# Patient Record
Sex: Female | Born: 1965
Health system: Southern US, Community
[De-identification: ages and names within clinical notes are randomized; demographics above are authoritative.]

## PROBLEM LIST (undated history)

## (undated) DIAGNOSIS — R519 Headache, unspecified: Secondary | ICD-10-CM

## (undated) DIAGNOSIS — Z720 Tobacco use: Secondary | ICD-10-CM

## (undated) DIAGNOSIS — IMO0001 Reserved for inherently not codable concepts without codable children: Secondary | ICD-10-CM

## (undated) DIAGNOSIS — E669 Obesity, unspecified: Secondary | ICD-10-CM

## (undated) DIAGNOSIS — M549 Dorsalgia, unspecified: Secondary | ICD-10-CM

## (undated) DIAGNOSIS — E119 Type 2 diabetes mellitus without complications: Secondary | ICD-10-CM

## (undated) DIAGNOSIS — M25539 Pain in unspecified wrist: Secondary | ICD-10-CM

## (undated) DIAGNOSIS — N390 Urinary tract infection, site not specified: Secondary | ICD-10-CM

## (undated) DIAGNOSIS — I10 Essential (primary) hypertension: Secondary | ICD-10-CM

## (undated) DIAGNOSIS — R51 Headache: Secondary | ICD-10-CM

## (undated) DIAGNOSIS — N200 Calculus of kidney: Secondary | ICD-10-CM

## (undated) DIAGNOSIS — E7800 Pure hypercholesterolemia, unspecified: Secondary | ICD-10-CM

## (undated) DIAGNOSIS — T7840XA Allergy, unspecified, initial encounter: Secondary | ICD-10-CM

## (undated) HISTORY — DX: Headache, unspecified: R51.9

## (undated) HISTORY — DX: Pain in unspecified wrist: M25.539

## (undated) HISTORY — DX: Allergy, unspecified, initial encounter: T78.40XA

## (undated) HISTORY — DX: Headache: R51

## (undated) HISTORY — DX: Essential (primary) hypertension: I10

## (undated) HISTORY — PX: DENTAL SURGERY: SHX609

---

## 1999-06-28 ENCOUNTER — Inpatient Hospital Stay: Admission: AD | Admit: 1999-06-28 | Discharge: 1999-06-28 | Payer: Self-pay | Admitting: Obstetrics

## 1999-07-01 ENCOUNTER — Emergency Department (HOSPITAL_COMMUNITY): Admission: EM | Admit: 1999-07-01 | Discharge: 1999-07-01 | Payer: Self-pay | Admitting: Emergency Medicine

## 1999-07-13 ENCOUNTER — Inpatient Hospital Stay (HOSPITAL_COMMUNITY): Admission: AD | Admit: 1999-07-13 | Discharge: 1999-07-13 | Payer: Self-pay | Admitting: *Deleted

## 1999-10-29 ENCOUNTER — Emergency Department (HOSPITAL_COMMUNITY): Admission: EM | Admit: 1999-10-29 | Discharge: 1999-10-29 | Payer: Self-pay | Admitting: Emergency Medicine

## 2000-02-12 ENCOUNTER — Inpatient Hospital Stay (HOSPITAL_COMMUNITY): Admission: AD | Admit: 2000-02-12 | Discharge: 2000-02-12 | Payer: Self-pay | Admitting: Obstetrics

## 2000-08-20 ENCOUNTER — Emergency Department (HOSPITAL_COMMUNITY): Admission: EM | Admit: 2000-08-20 | Discharge: 2000-08-20 | Payer: Self-pay | Admitting: *Deleted

## 2000-08-31 ENCOUNTER — Emergency Department (HOSPITAL_COMMUNITY): Admission: EM | Admit: 2000-08-31 | Discharge: 2000-08-31 | Payer: Self-pay | Admitting: Emergency Medicine

## 2000-09-16 ENCOUNTER — Emergency Department (HOSPITAL_COMMUNITY): Admission: EM | Admit: 2000-09-16 | Discharge: 2000-09-17 | Payer: Self-pay | Admitting: Emergency Medicine

## 2000-09-16 ENCOUNTER — Encounter: Payer: Self-pay | Admitting: Emergency Medicine

## 2000-10-05 ENCOUNTER — Encounter: Payer: Self-pay | Admitting: Emergency Medicine

## 2000-10-05 ENCOUNTER — Emergency Department (HOSPITAL_COMMUNITY): Admission: EM | Admit: 2000-10-05 | Discharge: 2000-10-05 | Payer: Self-pay | Admitting: Emergency Medicine

## 2000-11-09 ENCOUNTER — Emergency Department (HOSPITAL_COMMUNITY): Admission: EM | Admit: 2000-11-09 | Discharge: 2000-11-09 | Payer: Self-pay | Admitting: Emergency Medicine

## 2001-07-30 ENCOUNTER — Emergency Department (HOSPITAL_COMMUNITY): Admission: EM | Admit: 2001-07-30 | Discharge: 2001-07-31 | Payer: Self-pay | Admitting: Emergency Medicine

## 2001-07-31 ENCOUNTER — Encounter: Payer: Self-pay | Admitting: Emergency Medicine

## 2002-03-13 ENCOUNTER — Emergency Department (HOSPITAL_COMMUNITY): Admission: EM | Admit: 2002-03-13 | Discharge: 2002-03-13 | Payer: Self-pay | Admitting: Emergency Medicine

## 2002-05-19 ENCOUNTER — Emergency Department (HOSPITAL_COMMUNITY): Admission: EM | Admit: 2002-05-19 | Discharge: 2002-05-20 | Payer: Self-pay | Admitting: Emergency Medicine

## 2002-05-19 ENCOUNTER — Encounter: Payer: Self-pay | Admitting: Emergency Medicine

## 2002-08-02 ENCOUNTER — Encounter: Payer: Self-pay | Admitting: Emergency Medicine

## 2002-08-02 ENCOUNTER — Emergency Department (HOSPITAL_COMMUNITY): Admission: EM | Admit: 2002-08-02 | Discharge: 2002-08-02 | Payer: Self-pay | Admitting: Emergency Medicine

## 2003-03-29 ENCOUNTER — Emergency Department (HOSPITAL_COMMUNITY): Admission: EM | Admit: 2003-03-29 | Discharge: 2003-03-29 | Payer: Self-pay | Admitting: Emergency Medicine

## 2003-06-01 ENCOUNTER — Inpatient Hospital Stay (HOSPITAL_COMMUNITY): Admission: AD | Admit: 2003-06-01 | Discharge: 2003-06-01 | Payer: Self-pay | Admitting: Obstetrics & Gynecology

## 2003-12-08 ENCOUNTER — Emergency Department (HOSPITAL_COMMUNITY): Admission: EM | Admit: 2003-12-08 | Discharge: 2003-12-08 | Payer: Self-pay | Admitting: Emergency Medicine

## 2004-04-20 ENCOUNTER — Ambulatory Visit: Payer: Self-pay | Admitting: *Deleted

## 2004-04-20 ENCOUNTER — Ambulatory Visit: Payer: Self-pay | Admitting: Nurse Practitioner

## 2004-04-27 ENCOUNTER — Ambulatory Visit (HOSPITAL_COMMUNITY): Admission: RE | Admit: 2004-04-27 | Discharge: 2004-04-27 | Payer: Self-pay | Admitting: Family Medicine

## 2004-05-27 ENCOUNTER — Ambulatory Visit: Payer: Self-pay | Admitting: Nurse Practitioner

## 2004-07-22 ENCOUNTER — Ambulatory Visit: Payer: Self-pay | Admitting: Nurse Practitioner

## 2004-07-26 ENCOUNTER — Ambulatory Visit: Payer: Self-pay | Admitting: Nurse Practitioner

## 2004-08-02 ENCOUNTER — Ambulatory Visit: Payer: Self-pay | Admitting: Nurse Practitioner

## 2004-08-16 ENCOUNTER — Ambulatory Visit: Payer: Self-pay | Admitting: Nurse Practitioner

## 2004-08-18 ENCOUNTER — Emergency Department (HOSPITAL_COMMUNITY): Admission: EM | Admit: 2004-08-18 | Discharge: 2004-08-18 | Payer: Self-pay | Admitting: Emergency Medicine

## 2004-08-18 ENCOUNTER — Ambulatory Visit (HOSPITAL_COMMUNITY): Admission: RE | Admit: 2004-08-18 | Discharge: 2004-08-18 | Payer: Self-pay | Admitting: Internal Medicine

## 2004-08-31 ENCOUNTER — Ambulatory Visit: Payer: Self-pay | Admitting: Nurse Practitioner

## 2006-04-04 ENCOUNTER — Emergency Department (HOSPITAL_COMMUNITY): Admission: EM | Admit: 2006-04-04 | Discharge: 2006-04-04 | Payer: Self-pay | Admitting: Emergency Medicine

## 2006-04-07 ENCOUNTER — Emergency Department (HOSPITAL_COMMUNITY): Admission: EM | Admit: 2006-04-07 | Discharge: 2006-04-07 | Payer: Self-pay | Admitting: Emergency Medicine

## 2006-05-05 ENCOUNTER — Emergency Department (HOSPITAL_COMMUNITY): Admission: EM | Admit: 2006-05-05 | Discharge: 2006-05-05 | Payer: Self-pay | Admitting: Emergency Medicine

## 2006-05-10 ENCOUNTER — Ambulatory Visit: Payer: Self-pay | Admitting: Nurse Practitioner

## 2006-08-19 ENCOUNTER — Emergency Department (HOSPITAL_COMMUNITY): Admission: EM | Admit: 2006-08-19 | Discharge: 2006-08-19 | Payer: Self-pay | Admitting: Emergency Medicine

## 2008-04-18 ENCOUNTER — Emergency Department (HOSPITAL_COMMUNITY): Admission: EM | Admit: 2008-04-18 | Discharge: 2008-04-18 | Payer: Self-pay | Admitting: Emergency Medicine

## 2009-02-01 ENCOUNTER — Inpatient Hospital Stay (HOSPITAL_COMMUNITY): Admission: AD | Admit: 2009-02-01 | Discharge: 2009-02-02 | Payer: Self-pay | Admitting: Obstetrics & Gynecology

## 2009-09-13 ENCOUNTER — Emergency Department (HOSPITAL_COMMUNITY): Admission: EM | Admit: 2009-09-13 | Discharge: 2009-09-13 | Payer: Self-pay | Admitting: Emergency Medicine

## 2010-02-01 ENCOUNTER — Emergency Department (HOSPITAL_COMMUNITY): Admission: EM | Admit: 2010-02-01 | Discharge: 2010-02-01 | Payer: Self-pay | Admitting: Family Medicine

## 2010-07-13 ENCOUNTER — Emergency Department (HOSPITAL_COMMUNITY): Admission: EM | Admit: 2010-07-13 | Discharge: 2010-07-13 | Payer: Self-pay | Admitting: Family Medicine

## 2010-09-14 ENCOUNTER — Emergency Department (HOSPITAL_COMMUNITY)
Admission: EM | Admit: 2010-09-14 | Discharge: 2010-09-14 | Payer: Self-pay | Source: Home / Self Care | Admitting: Family Medicine

## 2010-09-21 ENCOUNTER — Other Ambulatory Visit: Payer: Self-pay | Admitting: Family Medicine

## 2010-09-21 ENCOUNTER — Other Ambulatory Visit: Payer: Self-pay

## 2010-09-21 DIAGNOSIS — Z1231 Encounter for screening mammogram for malignant neoplasm of breast: Secondary | ICD-10-CM

## 2010-09-21 DIAGNOSIS — Z01419 Encounter for gynecological examination (general) (routine) without abnormal findings: Secondary | ICD-10-CM | POA: Insufficient documentation

## 2010-09-27 ENCOUNTER — Ambulatory Visit
Admission: RE | Admit: 2010-09-27 | Discharge: 2010-09-27 | Disposition: A | Discharge status: Home / Self Care | Payer: 59 | Source: Ambulatory Visit | Attending: Family Medicine | Admitting: Family Medicine

## 2010-09-27 DIAGNOSIS — Z1231 Encounter for screening mammogram for malignant neoplasm of breast: Secondary | ICD-10-CM

## 2010-10-05 ENCOUNTER — Other Ambulatory Visit (HOSPITAL_COMMUNITY)
Admission: RE | Admit: 2010-10-05 | Discharge: 2010-10-05 | Disposition: A | Discharge status: Home / Self Care | Payer: 59 | Source: Ambulatory Visit | Attending: Internal Medicine | Admitting: Internal Medicine

## 2010-11-22 LAB — WET PREP, GENITAL: Yeast Wet Prep HPF POC: NONE SEEN

## 2010-11-22 LAB — URINALYSIS, ROUTINE W REFLEX MICROSCOPIC
Glucose, UA: NEGATIVE mg/dL
Nitrite: NEGATIVE
Protein, ur: NEGATIVE mg/dL
pH: 6 (ref 5.0–8.0)

## 2010-11-22 LAB — URINE CULTURE

## 2010-11-22 LAB — GC/CHLAMYDIA PROBE AMP, GENITAL
Chlamydia, DNA Probe: NEGATIVE
GC Probe Amp, Genital: NEGATIVE

## 2010-11-22 LAB — URINE MICROSCOPIC-ADD ON

## 2010-11-22 LAB — POCT PREGNANCY, URINE: Preg Test, Ur: NEGATIVE

## 2011-05-18 LAB — URINALYSIS, ROUTINE W REFLEX MICROSCOPIC
Bilirubin Urine: NEGATIVE
Glucose, UA: NEGATIVE
Nitrite: NEGATIVE
Protein, ur: NEGATIVE
Specific Gravity, Urine: 1.023

## 2011-05-18 LAB — URINE CULTURE

## 2011-05-18 LAB — URINE MICROSCOPIC-ADD ON

## 2011-09-12 ENCOUNTER — Other Ambulatory Visit: Payer: Self-pay | Admitting: Family Medicine

## 2011-09-12 DIAGNOSIS — Z1231 Encounter for screening mammogram for malignant neoplasm of breast: Secondary | ICD-10-CM

## 2011-10-07 ENCOUNTER — Ambulatory Visit: Payer: 59

## 2011-10-13 ENCOUNTER — Ambulatory Visit
Admission: RE | Admit: 2011-10-13 | Discharge: 2011-10-13 | Disposition: A | Discharge status: Home / Self Care | Payer: BC Managed Care – PPO | Source: Ambulatory Visit | Attending: Family Medicine | Admitting: Family Medicine

## 2011-10-13 DIAGNOSIS — Z1231 Encounter for screening mammogram for malignant neoplasm of breast: Secondary | ICD-10-CM

## 2011-10-17 ENCOUNTER — Other Ambulatory Visit: Payer: Self-pay

## 2011-10-17 ENCOUNTER — Other Ambulatory Visit (HOSPITAL_COMMUNITY)
Admission: RE | Admit: 2011-10-17 | Discharge: 2011-10-17 | Disposition: A | Discharge status: Home / Self Care | Payer: BC Managed Care – PPO | Source: Ambulatory Visit | Attending: Family Medicine | Admitting: Family Medicine

## 2011-10-17 DIAGNOSIS — Z01419 Encounter for gynecological examination (general) (routine) without abnormal findings: Secondary | ICD-10-CM | POA: Insufficient documentation

## 2012-09-21 ENCOUNTER — Other Ambulatory Visit: Payer: Self-pay | Admitting: Family Medicine

## 2012-09-21 DIAGNOSIS — Z1231 Encounter for screening mammogram for malignant neoplasm of breast: Secondary | ICD-10-CM

## 2012-10-22 ENCOUNTER — Ambulatory Visit
Admission: RE | Admit: 2012-10-22 | Discharge: 2012-10-22 | Disposition: A | Discharge status: Home / Self Care | Payer: BC Managed Care – PPO | Source: Ambulatory Visit | Attending: Family Medicine | Admitting: Family Medicine

## 2012-10-22 DIAGNOSIS — Z1231 Encounter for screening mammogram for malignant neoplasm of breast: Secondary | ICD-10-CM

## 2012-10-29 ENCOUNTER — Other Ambulatory Visit (HOSPITAL_COMMUNITY)
Admission: RE | Admit: 2012-10-29 | Discharge: 2012-10-29 | Disposition: A | Discharge status: Home / Self Care | Payer: BC Managed Care – PPO | Source: Ambulatory Visit | Attending: Family Medicine | Admitting: Family Medicine

## 2012-10-29 ENCOUNTER — Other Ambulatory Visit: Payer: Self-pay | Admitting: Family Medicine

## 2012-10-29 DIAGNOSIS — Z0000 Encounter for general adult medical examination without abnormal findings: Secondary | ICD-10-CM | POA: Insufficient documentation

## 2013-10-22 ENCOUNTER — Other Ambulatory Visit: Payer: Self-pay

## 2013-10-22 DIAGNOSIS — Z1231 Encounter for screening mammogram for malignant neoplasm of breast: Secondary | ICD-10-CM

## 2013-10-30 ENCOUNTER — Other Ambulatory Visit: Payer: Self-pay | Admitting: Family Medicine

## 2013-10-30 DIAGNOSIS — E010 Iodine-deficiency related diffuse (endemic) goiter: Secondary | ICD-10-CM

## 2013-10-31 ENCOUNTER — Ambulatory Visit
Admission: RE | Admit: 2013-10-31 | Discharge: 2013-10-31 | Disposition: A | Discharge status: Home / Self Care | Payer: BC Managed Care – PPO | Source: Ambulatory Visit | Attending: Family Medicine | Admitting: Family Medicine

## 2013-10-31 ENCOUNTER — Ambulatory Visit
Admission: RE | Admit: 2013-10-31 | Discharge: 2013-10-31 | Disposition: A | Discharge status: Home / Self Care | Payer: BC Managed Care – PPO | Source: Ambulatory Visit

## 2013-10-31 DIAGNOSIS — E010 Iodine-deficiency related diffuse (endemic) goiter: Secondary | ICD-10-CM

## 2013-10-31 DIAGNOSIS — Z1231 Encounter for screening mammogram for malignant neoplasm of breast: Secondary | ICD-10-CM

## 2013-12-05 ENCOUNTER — Other Ambulatory Visit: Payer: Self-pay | Admitting: Family Medicine

## 2013-12-05 ENCOUNTER — Ambulatory Visit
Admission: RE | Admit: 2013-12-05 | Discharge: 2013-12-05 | Disposition: A | Discharge status: Home / Self Care | Payer: BC Managed Care – PPO | Source: Ambulatory Visit | Attending: Family Medicine | Admitting: Family Medicine

## 2013-12-05 DIAGNOSIS — M549 Dorsalgia, unspecified: Secondary | ICD-10-CM

## 2014-02-22 ENCOUNTER — Emergency Department (HOSPITAL_COMMUNITY)
Admission: EM | Admit: 2014-02-22 | Discharge: 2014-02-22 | Disposition: A | Discharge status: Home / Self Care | Payer: BC Managed Care – PPO | Attending: Emergency Medicine | Admitting: Emergency Medicine

## 2014-02-22 ENCOUNTER — Emergency Department (HOSPITAL_COMMUNITY): Payer: BC Managed Care – PPO

## 2014-02-22 ENCOUNTER — Encounter (HOSPITAL_COMMUNITY): Payer: Self-pay | Admitting: Emergency Medicine

## 2014-02-22 DIAGNOSIS — F17200 Nicotine dependence, unspecified, uncomplicated: Secondary | ICD-10-CM | POA: Insufficient documentation

## 2014-02-22 DIAGNOSIS — N210 Calculus in bladder: Secondary | ICD-10-CM | POA: Insufficient documentation

## 2014-02-22 DIAGNOSIS — Z3202 Encounter for pregnancy test, result negative: Secondary | ICD-10-CM | POA: Insufficient documentation

## 2014-02-22 DIAGNOSIS — Z87440 Personal history of urinary (tract) infections: Secondary | ICD-10-CM | POA: Insufficient documentation

## 2014-02-22 DIAGNOSIS — E119 Type 2 diabetes mellitus without complications: Secondary | ICD-10-CM | POA: Insufficient documentation

## 2014-02-22 DIAGNOSIS — Z880 Allergy status to penicillin: Secondary | ICD-10-CM | POA: Insufficient documentation

## 2014-02-22 HISTORY — DX: Urinary tract infection, site not specified: N39.0

## 2014-02-22 HISTORY — DX: Type 2 diabetes mellitus without complications: E11.9

## 2014-02-22 HISTORY — DX: Pure hypercholesterolemia, unspecified: E78.00

## 2014-02-22 LAB — URINE MICROSCOPIC-ADD ON

## 2014-02-22 LAB — CBC WITH DIFFERENTIAL/PLATELET
BASOS ABS: 0 10*3/uL (ref 0.0–0.1)
Basophils Relative: 0 % (ref 0–1)
Eosinophils Absolute: 0.1 10*3/uL (ref 0.0–0.7)
Eosinophils Relative: 2 % (ref 0–5)
HEMATOCRIT: 39.4 % (ref 36.0–46.0)
HEMOGLOBIN: 13.5 g/dL (ref 12.0–15.0)
LYMPHS ABS: 3.4 10*3/uL (ref 0.7–4.0)
LYMPHS PCT: 47 % — AB (ref 12–46)
MCH: 28.7 pg (ref 26.0–34.0)
MCHC: 34.3 g/dL (ref 30.0–36.0)
MCV: 83.8 fL (ref 78.0–100.0)
MONO ABS: 0.4 10*3/uL (ref 0.1–1.0)
Monocytes Relative: 6 % (ref 3–12)
NEUTROS ABS: 3.2 10*3/uL (ref 1.7–7.7)
Neutrophils Relative %: 45 % (ref 43–77)
Platelets: 290 10*3/uL (ref 150–400)
RBC: 4.7 MIL/uL (ref 3.87–5.11)
RDW: 13.3 % (ref 11.5–15.5)
WBC: 7.2 10*3/uL (ref 4.0–10.5)

## 2014-02-22 LAB — BASIC METABOLIC PANEL
ANION GAP: 13 (ref 5–15)
BUN: 13 mg/dL (ref 6–23)
CHLORIDE: 103 meq/L (ref 96–112)
CO2: 26 meq/L (ref 19–32)
CREATININE: 0.78 mg/dL (ref 0.50–1.10)
Calcium: 9.2 mg/dL (ref 8.4–10.5)
GFR calc Af Amer: >90 mL/min (ref >90)
GFR calc non Af Amer: >90 mL/min (ref >90)
GLUCOSE: 138 mg/dL — AB (ref 70–99)
Potassium: 3.8 mEq/L (ref 3.7–5.3)
Sodium: 142 mEq/L (ref 137–147)

## 2014-02-22 LAB — URINALYSIS, ROUTINE W REFLEX MICROSCOPIC
Glucose, UA: NEGATIVE mg/dL
Ketones, ur: 15 mg/dL — AB
Nitrite: NEGATIVE
PH: 5.5 (ref 5.0–8.0)
Protein, ur: 100 mg/dL — AB
SPECIFIC GRAVITY, URINE: 1.026 (ref 1.005–1.030)
Urobilinogen, UA: 1 mg/dL (ref 0.0–1.0)

## 2014-02-22 LAB — WET PREP, GENITAL
CLUE CELLS WET PREP: NONE SEEN
Trich, Wet Prep: NONE SEEN
Yeast Wet Prep HPF POC: NONE SEEN

## 2014-02-22 LAB — PREGNANCY, URINE: Preg Test, Ur: NEGATIVE

## 2014-02-22 MED ORDER — OXYCODONE-ACETAMINOPHEN 5-325 MG PO TABS: 1.0000 | ORAL_TABLET | ORAL | Status: DC | PRN

## 2014-02-22 NOTE — ED Provider Notes (Signed)
CSN: [id number]     Arrival date & time 02/22/14  0814 History   First MD Initiated Contact with Patient 02/22/14 0754     Chief Complaint  Patient presents with  . Flank Pain  . Hematuria     (Consider location/radiation/quality/duration/timing/severity/associated sxs/prior Treatment) HPI Comments: The patient is a 48 year female past medical history of diabetes, Ovarian cysts, UTI of present emergency room and chief complaint of dysuria ongoing for 2 weeks. The patient reports she was evaluated by a primary care provider and was diagnosed with a urinary tract infection 2 weeks ago, placed on Macrobid reports completing course. She was also diagnosed with a yeast infection and placed on Diflucan. She reports persistent symptoms and was reevaluated by her PCP Dr. Chapman Fitch, and placed on Cipro, 3 days ago. She reports compliance with all antibiotics. The patient states right lower abdominal pain and back pain since this morning grade 3/10. She was up reports hematuria today. No history of renal calculi. No fever, chills, nausea, vomiting, diarrhea, constipation. Patient's last menstrual period was 08/22/2011.   Patient is a 48 y.o. female presenting with flank pain and hematuria. The history is provided by the patient. No language interpreter was used.  Flank Pain Associated symptoms include abdominal pain. Pertinent negatives include no chills, fever, nausea or vomiting.  Hematuria Associated symptoms include abdominal pain. Pertinent negatives include no chills, fever, nausea or vomiting.    Past Medical History  Diagnosis Date  . UTI (lower urinary tract infection)   . Diabetes mellitus without complication    Past Surgical History  Procedure Laterality Date  . Cesarean section     No family history on file. History  Substance Use Topics  . Smoking status: Current Every Day Smoker -- 0.50 packs/day    Types: Cigarettes  . Smokeless tobacco: Not on file  . Alcohol Use: No   OB  History   Grav Para Term Preterm Abortions TAB SAB Ect Mult Living                 Review of Systems  Constitutional: Negative for fever and chills.  Gastrointestinal: Positive for abdominal pain. Negative for nausea, vomiting, diarrhea and constipation.  Genitourinary: Positive for dysuria, urgency, hematuria and flank pain. Negative for vaginal bleeding and vaginal discharge.      Allergies  Asa; Iohexol; Penicillins; Statins; and Sulfur  Home Medications   Prior to Admission medications   Not on File   BP 139/84  Pulse 89  Temp(Src) 98 F (36.7 C) (Oral)  Resp 18  SpO2 99%  LMP 08/22/2011 Physical Exam  Nursing note and vitals reviewed. Constitutional: She is oriented to person, place, and time. She appears well-developed and well-nourished.  Non-toxic appearance. She does not have a sickly appearance. She does not appear ill. No distress.  HENT:  Head: Normocephalic and atraumatic.  Eyes: EOM are normal. Pupils are equal, round, and reactive to light.  Neck: Neck supple.  Cardiovascular: Normal rate and regular rhythm.   Pulmonary/Chest: Effort normal and breath sounds normal. No respiratory distress. She has no wheezes. She has no rales.  Abdominal: Soft. She exhibits no distension. There is no tenderness. There is no rebound, no guarding, no CVA tenderness, no tenderness at McBurney's point and negative Murphy's sign.  Genitourinary: Uterus is not tender. Cervix exhibits no motion tenderness. Right adnexum displays tenderness. Right adnexum displays no mass. Left adnexum displays no mass and no tenderness.  Minimal amount of white discharge in the posterior  vaginal vault. Chaperone present.   Musculoskeletal: Normal range of motion.  Neurological: She is alert and oriented to person, place, and time.  Skin: Skin is warm and dry. She is not diaphoretic.  Psychiatric: She has a normal mood and affect. Her behavior is normal.    ED Course  Procedures (including  critical care time) Labs Review Results for orders placed during the hospital encounter of 02/22/14  WET PREP, GENITAL      Result Value Ref Range   Yeast Wet Prep HPF POC NONE SEEN  NONE SEEN   Trich, Wet Prep NONE SEEN  NONE SEEN   Clue Cells Wet Prep HPF POC NONE SEEN  NONE SEEN   WBC, Wet Prep HPF POC RARE (*) NONE SEEN  PREGNANCY, URINE      Result Value Ref Range   Preg Test, Ur NEGATIVE  NEGATIVE  URINALYSIS, ROUTINE W REFLEX MICROSCOPIC      Result Value Ref Range   Color, Urine RED (*) YELLOW   APPearance TURBID (*) CLEAR   Specific Gravity, Urine 1.026  1.005 - 1.030   pH 5.5  5.0 - 8.0   Glucose, UA NEGATIVE  NEGATIVE mg/dL   Hgb urine dipstick LARGE (*) NEGATIVE   Bilirubin Urine SMALL (*) NEGATIVE   Ketones, ur 15 (*) NEGATIVE mg/dL   Protein, ur 100 (*) NEGATIVE mg/dL   Urobilinogen, UA 1.0  0.0 - 1.0 mg/dL   Nitrite NEGATIVE  NEGATIVE   Leukocytes, UA SMALL (*) NEGATIVE  CBC WITH DIFFERENTIAL      Result Value Ref Range   WBC 7.2  4.0 - 10.5 K/uL   RBC 4.70  3.87 - 5.11 MIL/uL   Hemoglobin 13.5  12.0 - 15.0 g/dL   HCT 39.4  36.0 - 46.0 %   MCV 83.8  78.0 - 100.0 fL   MCH 28.7  26.0 - 34.0 pg   MCHC 34.3  30.0 - 36.0 g/dL   RDW 13.3  11.5 - 15.5 %   Platelets 290  150 - 400 K/uL   Neutrophils Relative % 45  43 - 77 %   Neutro Abs 3.2  1.7 - 7.7 K/uL   Lymphocytes Relative 47 (*) 12 - 46 %   Lymphs Abs 3.4  0.7 - 4.0 K/uL   Monocytes Relative 6  3 - 12 %   Monocytes Absolute 0.4  0.1 - 1.0 K/uL   Eosinophils Relative 2  0 - 5 %   Eosinophils Absolute 0.1  0.0 - 0.7 K/uL   Basophils Relative 0  0 - 1 %   Basophils Absolute 0.0  0.0 - 0.1 K/uL  BASIC METABOLIC PANEL      Result Value Ref Range   Sodium 142  137 - 147 mEq/L   Potassium 3.8  3.7 - 5.3 mEq/L   Chloride 103  96 - 112 mEq/L   CO2 26  19 - 32 mEq/L   Glucose, Bld 138 (*) 70 - 99 mg/dL   BUN 13  6 - 23 mg/dL   Creatinine, Ser 0.78  0.50 - 1.10 mg/dL   Calcium 9.2  8.4 - 10.5 mg/dL   GFR  calc non Af Amer >90  >90 mL/min   GFR calc Af Amer >90  >90 mL/min   Anion gap 13  5 - 15  URINE MICROSCOPIC-ADD ON      Result Value Ref Range   Squamous Epithelial / LPF FEW (*) RARE   WBC, UA 7-10  <3  WBC/hpf   RBC / HPF TOO NUMEROUS TO COUNT  <3 RBC/hpf   Bacteria, UA FEW (*) RARE   Ct Abdomen Pelvis Wo Contrast  02/22/2014   CLINICAL DATA:  Right-sided flank pain  EXAM: CT ABDOMEN AND PELVIS WITHOUT CONTRAST  TECHNIQUE: Multidetector CT imaging of the abdomen and pelvis was performed following the standard protocol without IV contrast.  COMPARISON:  08/18/2004  FINDINGS: Lung bases demonstrate some dependent atelectatic changes.  The liver, gallbladder, spleen, adrenal glands and pancreas are within normal limits. The kidneys are well visualized bilaterally and reveal no renal calculi or urinary tract obstructive changes.  The appendix is within normal limits. Scattered diverticular change is seen. No significant diverticulitis is noted.  The bladder is well distended. A 7 mm calcification is noted in the dependent portion of the bladder inferiorly consistent with a bladder calculus. This may be the etiology of patient's current hematuria. No pelvic sidewall abnormality is noted. No mass lesion is seen. No significant lymphadenopathy is noted. No acute bony abnormality is seen.  IMPRESSION: 7 mm bladder calculus. This may be the etiology of patient's current hematuria.  No other focal acute abnormality is noted.   Electronically Signed   By: Inez Catalina M.D.   On: 02/22/2014 10:30   US Transvaginal Non-ob  02/22/2014   CLINICAL DATA:  Urinary tract infection. Pain. Evaluate for ovarian torsion.  EXAM: TRANSABDOMINAL ULTRASOUND OF PELVIS  DOPPLER ULTRASOUND OF OVARIES  TECHNIQUE: Transabdominal ultrasound examination of the pelvis was performed including evaluation of the uterus, ovaries, adnexal regions, and pelvic cul-de-sac.  Color and duplex Doppler ultrasound was utilized to evaluate blood  flow to the ovaries.  COMPARISON:  02/22/2014 .  FINDINGS: Uterus  Measurements: 6.6 x 3.1 x 4.0 cm No fibroids or other mass visualized.  Endometrium  Thickness: 4 mm  No focal abnormality visualized.  Right ovary  Measurements: 2.3 x 1.5 x 1.3 some Normal appearance/no adnexal mass.  Left ovary  Measurements: 1.5 x 1.2 x 1.0 cm Normal appearance/no adnexal mass.  Pulsed Doppler evaluation demonstrates normal low-resistance arterial and venous waveforms in both ovaries.  8 mm echogenic focus with shadowing noted along the posterior aspect the bladder suggesting bladder stone.  IMPRESSION: 1. 8 mm bladder stone. 2. No evidence of ovarian torsion.   Electronically Signed   By: Marcello Moores  Register   On: 02/22/2014 11:28   US Pelvis Complete  02/22/2014   CLINICAL DATA:  Urinary tract infection. Pain. Evaluate for ovarian torsion.  EXAM: TRANSABDOMINAL ULTRASOUND OF PELVIS  DOPPLER ULTRASOUND OF OVARIES  TECHNIQUE: Transabdominal ultrasound examination of the pelvis was performed including evaluation of the uterus, ovaries, adnexal regions, and pelvic cul-de-sac.  Color and duplex Doppler ultrasound was utilized to evaluate blood flow to the ovaries.  COMPARISON:  02/22/2014 .  FINDINGS: Uterus  Measurements: 6.6 x 3.1 x 4.0 cm No fibroids or other mass visualized.  Endometrium  Thickness: 4 mm  No focal abnormality visualized.  Right ovary  Measurements: 2.3 x 1.5 x 1.3 some Normal appearance/no adnexal mass.  Left ovary  Measurements: 1.5 x 1.2 x 1.0 cm Normal appearance/no adnexal mass.  Pulsed Doppler evaluation demonstrates normal low-resistance arterial and venous waveforms in both ovaries.  8 mm echogenic focus with shadowing noted along the posterior aspect the bladder suggesting bladder stone.  IMPRESSION: 1. 8 mm bladder stone. 2. No evidence of ovarian torsion.   Electronically Signed   By: Marcello Moores  Register   On: 02/22/2014 11:28   [place]  Art/ven Flow Abd Pelv Doppler  02/22/2014   CLINICAL DATA:  Urinary  tract infection. Pain. Evaluate for ovarian torsion.  EXAM: TRANSABDOMINAL ULTRASOUND OF PELVIS  DOPPLER ULTRASOUND OF OVARIES  TECHNIQUE: Transabdominal ultrasound examination of the pelvis was performed including evaluation of the uterus, ovaries, adnexal regions, and pelvic cul-de-sac.  Color and duplex Doppler ultrasound was utilized to evaluate blood flow to the ovaries.  COMPARISON:  02/22/2014 .  FINDINGS: Uterus  Measurements: 6.6 x 3.1 x 4.0 cm No fibroids or other mass visualized.  Endometrium  Thickness: 4 mm  No focal abnormality visualized.  Right ovary  Measurements: 2.3 x 1.5 x 1.3 some Normal appearance/no adnexal mass.  Left ovary  Measurements: 1.5 x 1.2 x 1.0 cm Normal appearance/no adnexal mass.  Pulsed Doppler evaluation demonstrates normal low-resistance arterial and venous waveforms in both ovaries.  8 mm echogenic focus with shadowing noted along the posterior aspect the bladder suggesting bladder stone.  IMPRESSION: 1. 8 mm bladder stone. 2. No evidence of ovarian torsion.   Electronically Signed   By: Marcello Moores  Register   On: 02/22/2014 11:28      EKG Interpretation None      MDM   Final diagnoses:  Bladder calculi   The patient is a 48 year old female presenting with persistent urinary symptoms for 2 weeks. Currently on day 3 of Cipro and full course of Bactrim, denies history of kidney stone but has had right flank pain and hematuria today. Declines pain medication this time. Plan to repeat UA, pelvic exam. Reevaluation patient resting only room declines pain medication at this time. CT shows a 7 mm bladder calculi, negative ultrasound. Discussed lab results, imaging results, and treatment plan with the patient. Return precautions given. Reports understanding and no other concerns at this time.  Patient is stable for discharge at this time.  Meds given in ED:  Medications - No data to display  Discharge Medication List as of 02/22/2014 11:52 AM    START taking these  medications   Details  oxyCODONE-acetaminophen (PERCOCET/ROXICET) 5-325 MG per tablet Take 1 tablet by mouth every 4 (four) hours as needed for severe pain., Starting 02/22/2014, Until Discontinued, Print            Lorrine Kin, PA-C 02/22/14 1523

## 2014-02-22 NOTE — ED Notes (Signed)
Pt currently unable to urinate. Given water to drink at this time.

## 2014-02-22 NOTE — ED Notes (Signed)
Pt stated that 2 weeks ago she was diagnosed with UTI and given and ABX. Pt finished with course of ABX and then stated that the stinging was still there so she went back to PCP and then was told she had a yeast infection and given another prescription of ABX and finished them. This morning pt stated that when she urinated while at work she had blood in her urine and noticed that she has some right sided flank pain. Rating pain 3/10.

## 2014-02-22 NOTE — ED Provider Notes (Signed)
Medical screening examination/treatment/procedure(s) were performed by non-physician practitioner and as supervising physician I was immediately available for consultation/collaboration.   EKG Interpretation None        Osvaldo Shipper, MD 02/22/14 1601

## 2014-02-22 NOTE — Discharge Instructions (Signed)
Call for a follow up appointment with a Family or Primary Care Provider.  Call a Urologist for further evaluation of your bladder stone. Return if Symptoms worsen.   Take medication as prescribed.  Do not operate heavy machinery while taking Percocet.  Drink plenty of fluids.

## 2014-02-24 LAB — GC/CHLAMYDIA PROBE AMP
CT Probe RNA: NEGATIVE
GC Probe RNA: NEGATIVE

## 2014-07-24 ENCOUNTER — Encounter (HOSPITAL_COMMUNITY): Payer: Self-pay | Admitting: Cardiology

## 2014-07-24 ENCOUNTER — Emergency Department (HOSPITAL_COMMUNITY)
Admission: EM | Admit: 2014-07-24 | Discharge: 2014-07-24 | Disposition: A | Discharge status: Home / Self Care | Payer: BC Managed Care – PPO | Attending: Emergency Medicine | Admitting: Emergency Medicine

## 2014-07-24 DIAGNOSIS — E119 Type 2 diabetes mellitus without complications: Secondary | ICD-10-CM | POA: Insufficient documentation

## 2014-07-24 DIAGNOSIS — W57XXXA Bitten or stung by nonvenomous insect and other nonvenomous arthropods, initial encounter: Secondary | ICD-10-CM | POA: Diagnosis not present

## 2014-07-24 DIAGNOSIS — E780 Pure hypercholesterolemia: Secondary | ICD-10-CM | POA: Insufficient documentation

## 2014-07-24 DIAGNOSIS — Y998 Other external cause status: Secondary | ICD-10-CM | POA: Insufficient documentation

## 2014-07-24 DIAGNOSIS — Y9289 Other specified places as the place of occurrence of the external cause: Secondary | ICD-10-CM | POA: Diagnosis not present

## 2014-07-24 DIAGNOSIS — S60561A Insect bite (nonvenomous) of right hand, initial encounter: Secondary | ICD-10-CM | POA: Diagnosis not present

## 2014-07-24 DIAGNOSIS — Z720 Tobacco use: Secondary | ICD-10-CM | POA: Insufficient documentation

## 2014-07-24 DIAGNOSIS — Z79899 Other long term (current) drug therapy: Secondary | ICD-10-CM | POA: Diagnosis not present

## 2014-07-24 DIAGNOSIS — B86 Scabies: Secondary | ICD-10-CM | POA: Insufficient documentation

## 2014-07-24 DIAGNOSIS — Z87440 Personal history of urinary (tract) infections: Secondary | ICD-10-CM | POA: Diagnosis not present

## 2014-07-24 DIAGNOSIS — R21 Rash and other nonspecific skin eruption: Secondary | ICD-10-CM | POA: Diagnosis present

## 2014-07-24 DIAGNOSIS — S60562A Insect bite (nonvenomous) of left hand, initial encounter: Secondary | ICD-10-CM | POA: Diagnosis not present

## 2014-07-24 DIAGNOSIS — Z880 Allergy status to penicillin: Secondary | ICD-10-CM | POA: Diagnosis not present

## 2014-07-24 DIAGNOSIS — Y9389 Activity, other specified: Secondary | ICD-10-CM | POA: Insufficient documentation

## 2014-07-24 MED ORDER — PERMETHRIN 5 % EX CREA: TOPICAL_CREAM | CUTANEOUS | Status: DC

## 2014-07-24 NOTE — Discharge Instructions (Signed)

## 2014-07-24 NOTE — ED Notes (Signed)
Pt states rash and itching started in between fingers and worse at night. Celesta Gentile shares bed and itching in between fingers. Medicines given by previous MD not helping.

## 2014-07-24 NOTE — ED Notes (Signed)
Declined W/C at D/C and was escorted to lobby by RN. 

## 2014-07-24 NOTE — ED Notes (Signed)
Pt reports she noticed a rash to bilateral upper arms and was given a cream that has not been helping.

## 2014-07-24 NOTE — ED Provider Notes (Signed)
CSN: [id number]     Arrival date & time 07/24/14  1321 History  This chart was scribed for non-physician practitioner, Lorre Munroe, PA-C, working with Fredia Sorrow, MD, by Jeanell Sparrow, ED Scribe. This patient was seen in room TR07C/TR07C and the patient's care was started at 3:02 PM.   Chief Complaint  Patient presents with  . Rash   The history is provided by the patient. No language interpreter was used.   HPI Comments: Jessica Lawson is a 48 y.o. female who presents to the Emergency Department complaining of a rash that started 3 days ago. She reports that the rash is on both of her hands and they are itchy. 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 She states that was given medication from another provider without any relief. She denies fevers, chills, n/v/d.  Past Medical History  Diagnosis Date  . UTI (lower urinary tract infection)   . Diabetes mellitus without complication   . High blood cholesterol    Past Surgical History  Procedure Laterality Date  . Cesarean section     History reviewed. No pertinent family history. History  Substance Use Topics  . Smoking status: Current Every Day Smoker -- 0.50 packs/day    Types: Cigarettes  . Smokeless tobacco: Not on file  . Alcohol Use: No   OB History    No data available     Review of Systems  Skin: Positive for rash.    Allergies  Asa; Iohexol; Penicillins; Statins; and Sulfur  Home Medications   Prior to Admission medications   Medication Sig Start Date End Date Taking? Authorizing Provider  atorvastatin (LIPITOR) 40 MG tablet Take 40 mg by mouth daily.    Historical Provider, MD  Multiple Vitamin (MULTIVITAMIN WITH MINERALS) TABS tablet Take 1 tablet by mouth daily.    Historical Provider, MD  oxyCODONE-acetaminophen (PERCOCET/ROXICET) 5-325 MG per tablet Take 1 tablet by mouth every 4 (four) hours as needed for severe pain. 02/22/14   Lauren Parker, PA-C    BP 143/81 mmHg  Pulse 74  Temp(Src) 97.9 F (36.6 C) (Oral)  Resp 18  Ht 4\' 11"  (1.499 m)  Wt 163 lb (73.936 kg)  BMI 32.90 kg/m2  SpO2 99%  LMP 08/22/2011 Physical Exam  Constitutional: She is oriented to person, place, and time. She appears well-developed and well-nourished. No distress.  HENT:  Head: Normocephalic and atraumatic.  Eyes: Conjunctivae and EOM are normal.  Cardiovascular: Normal rate and regular rhythm.   Pulmonary/Chest: Effort normal and breath sounds normal. No stridor. No respiratory distress.  Abdominal: She exhibits no distension.  Musculoskeletal: She exhibits no edema.  Neurological: She is alert and oriented to person, place, and time. No cranial nerve deficit.  Skin: Skin is warm and dry.  Scattered maculopapular bug bites on the upper extremities suspicious for Scabies.   Psychiatric: She has a normal mood and affect.  Nursing note and vitals reviewed.   ED Course  Procedures (including critical care time) DIAGNOSTIC STUDIES: Oxygen Saturation is 99% on RA, normal by my interpretation.    COORDINATION OF CARE: 3:06 PM- Pt advised of plan for treatment which includes medication and pt agrees.  Labs Review Labs Reviewed - No data to display  Imaging Review No results found.   EKG Interpretation None      MDM   Final diagnoses:  Insect bite    Discussed diagnosis & treatment of scabies with patient and/or parent/guardian.  They have been advised to followup with her primary care doctor 2 weeks after treatment.  They have also been advised to clean entire household, including washing sheets in warm water.   The use of permethrin cream was discussed as well, they were told to use cream from the neck down & leave on for 8-12 hours.  They've been advised to repeat treatment in one week if new eruptions occur. Patient/parent/guardian verbalized understanding.    I personally performed the services described in this documentation, which was  scribed in my presence. The recorded information has been reviewed and is accurate.     Montine Circle, PA-C 07/24/14 1541  Fredia Sorrow, MD 07/28/14 [phone]

## 2014-09-26 ENCOUNTER — Other Ambulatory Visit: Payer: Self-pay

## 2014-09-26 DIAGNOSIS — Z1231 Encounter for screening mammogram for malignant neoplasm of breast: Secondary | ICD-10-CM

## 2014-11-03 ENCOUNTER — Ambulatory Visit: Payer: Self-pay

## 2014-11-03 ENCOUNTER — Ambulatory Visit
Admission: RE | Admit: 2014-11-03 | Discharge: 2014-11-03 | Disposition: A | Discharge status: Home / Self Care | Payer: PRIVATE HEALTH INSURANCE | Source: Ambulatory Visit

## 2014-11-03 DIAGNOSIS — Z1231 Encounter for screening mammogram for malignant neoplasm of breast: Secondary | ICD-10-CM

## 2014-11-20 ENCOUNTER — Encounter: Payer: PRIVATE HEALTH INSURANCE | Attending: Family | Admitting: Skilled Nursing Facility1

## 2014-11-20 ENCOUNTER — Encounter: Payer: Self-pay | Admitting: Skilled Nursing Facility1

## 2014-11-20 VITALS — Ht 59.0 in | Wt 174.0 lb

## 2014-11-20 DIAGNOSIS — Z713 Dietary counseling and surveillance: Secondary | ICD-10-CM | POA: Diagnosis not present

## 2014-11-20 DIAGNOSIS — E669 Obesity, unspecified: Secondary | ICD-10-CM | POA: Insufficient documentation

## 2014-11-20 DIAGNOSIS — Z6835 Body mass index (BMI) 35.0-35.9, adult: Secondary | ICD-10-CM | POA: Diagnosis not present

## 2014-11-20 NOTE — Patient Instructions (Addendum)
-  Try to introduce new vegetables to your diet  -Try a new vegetable each week  -Eat three meals a day and 2-3 snacks -

## 2014-11-20 NOTE — Progress Notes (Signed)
  Medical Nutrition Therapy:  Appt start time: 09:15 end time:  10:15.   Assessment:  Primary concerns today: Referred for obesity. Pt states she wants to focus on Trying to get her A1C decreased from 6.5%. 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 [employer] she Does not like salad or any non starchy vegetable for that matter. Pt states her  fiancee cooks dinner and is busy picking up and dropping of her kids (pt states her kids are all grown). Pt states she goes to sleep at 9pm and tosses/turns all night.  Pts last meal of the day is at 10:30am.  Preferred Learning Style:   Auditory  Visual  Learning Readiness:   Not ready  Contemplating  MEDICATIONS: Metformin   DIETARY INTAKE:  Usual eating pattern includes 2 meals and 2 snacks per day.  Everyday foods include none stated.  Avoided foods include non-starchy vegetables.    24-hr recall:  B ( AM): apple or orange Snk ( AM): fruit L ( PM): salad---mashed potatoes with gravy----none Snk ( PM): fruit D ( PM): none------rice and neck bones (country cooking) Snk ( PM): cookies Beverages: coffee with sweet and low and smoke a cigarrete  Usual physical activity: fitness class 30 minutes twice a week  Estimated energy needs: 1600 calories 180 g carbohydrates 120 g protein 44 g fat  Progress Towards Goal(s):  In progress.   Nutritional Diagnosis:  Impaired nutrient utilization related to type 2 diabetes as evidenced by A1C 6.5%.    Intervention:  Nutrition counseling for type 2 diabetes/weight loss. Dietitian educated the pt on: type 2 diabetes physiology, CHO counting, nutrition facts label reading, eating throughout the day (3 meals/2-3 snacks) and physical activity. Dietitian also advised she consume non-starchy vegetables every day.  Goals: -Try to introduce new vegetables to your diet  -Try a new vegetable each week  -Eat three meals a day and 2-3 snacks -Carbohydrate  count for all meals and snacks  Teaching Method Utilized:  Visual Auditory  Handouts given during visit include:  Diabetes book  Diabetes CHO counting card  Low sodium seasoning options  15 gram carbohydrate snack sheet  Barriers to learning/adherence to lifestyle change: hectic work schedule  Demonstrated degree of understanding via:  Teach Back   Monitoring/Evaluation:  Dietary intake, exercise, CHO counting, and body weight prn.

## 2015-02-06 ENCOUNTER — Emergency Department (HOSPITAL_COMMUNITY)
Admission: EM | Admit: 2015-02-06 | Discharge: 2015-02-06 | Disposition: A | Discharge status: Home / Self Care | Payer: PRIVATE HEALTH INSURANCE | Source: Home / Self Care | Attending: Family Medicine | Admitting: Family Medicine

## 2015-02-06 ENCOUNTER — Encounter (HOSPITAL_COMMUNITY): Payer: Self-pay | Admitting: Emergency Medicine

## 2015-02-06 DIAGNOSIS — R0789 Other chest pain: Secondary | ICD-10-CM

## 2015-02-06 MED ORDER — TRAMADOL HCL 50 MG PO TABS: 50.0000 mg | ORAL_TABLET | Freq: Four times a day (QID) | ORAL | Status: DC | PRN

## 2015-02-06 NOTE — ED Provider Notes (Signed)
CSN: [id number]     Arrival date & time 02/06/15  1549 History   First MD Initiated Contact with Patient 02/06/15 1642     Chief Complaint  Patient presents with  . Chest Pain   (Consider location/radiation/quality/duration/timing/severity/associated sxs/prior Treatment) Patient is a 49 y.o. female presenting with chest pain. The history is provided by the patient.  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 UTI (lower urinary tract infection)   . Diabetes mellitus without complication   . High blood cholesterol    Past Surgical History  Procedure Laterality Date  . Cesarean section     Family History  Problem Relation Age of Onset  . Hyperlipidemia Other   . Hypertension Other   . Diabetes Other    History  Substance Use Topics  . Smoking status: Current Every Day Smoker -- 0.50 packs/day    Types: Cigarettes  . Smokeless tobacco: Not on file  . Alcohol Use: No   OB History    No data available     Review of Systems  Constitutional: Negative.  Negative for fever.  Respiratory: Negative for cough, chest tightness, shortness of breath and wheezing.   Cardiovascular: Positive for chest pain. Negative for palpitations and leg swelling.  Gastrointestinal: Negative.  Negative for abdominal pain.  Musculoskeletal: Negative for back pain.    Allergies  Asa; Iohexol; Penicillins;  Statins; and Sulfur  Home Medications   Prior to Admission medications   Medication Sig Start Date End Date Taking? Authorizing Provider  atorvastatin (LIPITOR) 40 MG tablet Take 40 mg by mouth daily.    Historical Provider, MD  metFORMIN (GLUCOPHAGE) 500 MG tablet Take by mouth 2 (two) times daily with a meal.    Historical Provider, MD  Multiple Vitamin (MULTIVITAMIN WITH MINERALS) TABS tablet Take 1 tablet by mouth daily.    Historical Provider, MD  Omega-3 Fatty Acids (FISH OIL) 1000 MG CAPS Take by mouth.    Historical Provider, MD  oxyCODONE-acetaminophen (PERCOCET/ROXICET) 5-325 MG per tablet Take 1 tablet by mouth every 4 (four) hours as needed for severe pain. Patient not taking: Reported on 11/20/2014 02/22/14   Harvie Heck, PA-C  permethrin (ELIMITE) 5 % cream Apply to entire body other than face - let sit for 14 hours then wash off, may repeat in 1 week if still having symptoms Patient not taking: Reported on 11/20/2014 07/24/14   Montine Circle, PA-C  traMADol (ULTRAM) 50 MG tablet Take 1 tablet (50 mg total) by mouth every 6 (six) hours as needed. 02/06/15   Billy Fischer, MD   BP 147/87 mmHg  Pulse 86  Temp(Src) 98.4 F (36.9 C) (Oral)  Resp 12  SpO2 98%  LMP 08/22/2011 Physical Exam  Constitutional: She is oriented to person, place, and time. She appears well-developed and well-nourished.  Neck: Normal range of motion. Neck  supple.  Cardiovascular: Normal rate, normal heart sounds and intact distal pulses.   Pulmonary/Chest: Effort normal and breath sounds normal. She exhibits tenderness.  Lymphadenopathy:    She has no cervical adenopathy.  Neurological: She is alert and oriented to person, place, and time.  Skin: Skin is warm and dry.  Nursing note and vitals reviewed.   ED Course  Procedures (including critical care time) Labs Review Labs Reviewed - No data to display  Imaging Review No results found.   MDM   1. Right-sided chest wall pain         Billy Fischer, MD 02/07/15 2053

## 2015-02-06 NOTE — ED Notes (Signed)
Reports intermittent, right chest pain and right arm pain with movement.  No sob, no nausea, no vomiting, started one week ago

## 2015-06-10 ENCOUNTER — Ambulatory Visit: Payer: PRIVATE HEALTH INSURANCE | Attending: Family Medicine

## 2015-08-13 ENCOUNTER — Emergency Department (INDEPENDENT_AMBULATORY_CARE_PROVIDER_SITE_OTHER)
Admission: EM | Admit: 2015-08-13 | Discharge: 2015-08-13 | Disposition: A | Discharge status: Home / Self Care | Payer: Self-pay | Source: Home / Self Care

## 2015-08-13 ENCOUNTER — Encounter (HOSPITAL_COMMUNITY): Payer: Self-pay | Admitting: Emergency Medicine

## 2015-08-13 DIAGNOSIS — H6690 Otitis media, unspecified, unspecified ear: Secondary | ICD-10-CM

## 2015-08-13 MED ORDER — AZITHROMYCIN 250 MG PO TABS: ORAL_TABLET | ORAL | Status: DC

## 2015-08-13 NOTE — ED Notes (Signed)
The patient presented to the [facility] with a complaint of nasal congestion and bilateral otalgia as well as back pain that all started 4 days ago.

## 2015-08-13 NOTE — Discharge Instructions (Signed)
Otitis Media, Adult °Otitis media is redness, soreness, and puffiness (swelling) in the space just behind your eardrum (middle ear). It may be caused by allergies or infection. It often happens along with a cold. °HOME CARE °· Take your medicine as told. Finish it even if you start to feel better. °· Only take over-the-counter or prescription medicines for pain, discomfort, or fever as told by your doctor. °· Follow up with your doctor as told. °GET HELP IF: °· You have otitis media only in one ear, or bleeding from your nose, or both. °· You notice a lump on your neck. °· You are not getting better in 3-5 days. °· You feel worse instead of better. °GET HELP RIGHT AWAY IF:  °· You have pain that is not helped with medicine. °· You have puffiness, redness, or pain around your ear. °· You get a stiff neck. °· You cannot move part of your face (paralysis). °· You notice that the bone behind your ear hurts when you touch it. °MAKE SURE YOU:  °· Understand these instructions. °· Will watch your condition. °· Will get help right away if you are not doing well or get worse. °  °This information is not intended to replace advice given to you by your health care provider. Make sure you discuss any questions you have with your health care provider. °  °Document Released: 01/18/2008 Document Revised: 08/22/2014 Document Reviewed: 02/26/2013 °Elsevier Interactive Patient Education ©2016 [employer] ° ° °

## 2015-08-13 NOTE — ED Provider Notes (Signed)
CSN: [id number]     Arrival date & time 08/13/15  1513 History   None    Chief Complaint  Patient presents with  . Nasal Congestion  . Back Pain   (Consider location/radiation/quality/duration/timing/severity/associated sxs/prior Treatment) HPI History obtained from patient:   LOCATION:upper resp/left ear pain SEVERITY: DURATION:several days CONTEXT:sudden onset QUALITY: MODIFYING FACTORS:Otc meds with some relief ASSOCIATED SYMPTOMS:ear, back pain TIMING:constant OCCUPATION:in house service  Past Medical History  Diagnosis Date  . UTI (lower urinary tract infection)   . Diabetes mellitus without complication ([place])   . High blood cholesterol    Past Surgical History  Procedure Laterality Date  . Cesarean section     Family History  Problem Relation Age of Onset  . Hyperlipidemia Other   . Hypertension Other   . Diabetes Other    Social History  Substance Use Topics  . Smoking status: Current Every Day Smoker -- 0.50 packs/day    Types: Cigarettes  . Smokeless tobacco: None  . Alcohol Use: No   OB History    No data available     Review of Systems ROS +'ve URI symptoms  Denies: HEADACHE, NAUSEA, ABDOMINAL PAIN, CHEST PAIN, CONGESTION, DYSURIA, SHORTNESS OF BREATH  Allergies  Asa; Iohexol; Penicillins; Statins; and Sulfur  Home Medications   Prior to Admission medications   Medication Sig Start Date End Date Taking? Authorizing Provider  atorvastatin (LIPITOR) 40 MG tablet Take 40 mg by mouth daily.    Historical Provider, MD  metFORMIN (GLUCOPHAGE) 500 MG tablet Take by mouth 2 (two) times daily with a meal.    Historical Provider, MD  Multiple Vitamin (MULTIVITAMIN WITH MINERALS) TABS tablet Take 1 tablet by mouth daily.    Historical Provider, MD  Omega-3 Fatty Acids (FISH OIL) 1000 MG CAPS Take by mouth.    Historical Provider, MD  oxyCODONE-acetaminophen (PERCOCET/ROXICET) 5-325 MG per tablet Take 1 tablet by mouth every 4 (four) hours as needed  for severe pain. Patient not taking: Reported on 11/20/2014 02/22/14   Harvie Heck, PA-C  permethrin (ELIMITE) 5 % cream Apply to entire body other than face - let sit for 14 hours then wash off, may repeat in 1 week if still having symptoms Patient not taking: Reported on 11/20/2014 07/24/14   Montine Circle, PA-C  traMADol (ULTRAM) 50 MG tablet Take 1 tablet (50 mg total) by mouth every 6 (six) hours as needed. 02/06/15   Billy Fischer, MD   Meds Ordered and Administered this Visit  Medications - No data to display  BP 159/99 mmHg  Pulse 85  Temp(Src) 98.2 F (36.8 C) (Oral)  Resp 20  SpO2 100%  LMP 08/22/2011 No data found.   Physical Exam NURSES NOTES AND VITAL SIGNS REVIEWED. CONSTITUTIONAL: Well developed, well nourished, no acute distress HEENT: normocephalic, atraumatic Left TM red , bulging, with poor light reflex, Right TM normal EYES: Conjunctiva normal NECK:normal ROM, supple PULMONARY:No respiratory distress, normal effort, Lungs: CTAb/l CARDIOVASCULAR: RRR, no murmur ABDOMEN: soft, ND, NT, +'ve BS MUSCULOSKELETAL: Normal ROM of all extremities SKIN: warm and dry without rash PSYCHIATRIC: Mood and affect normal  ED Course  Procedures (including critical care time)  Labs Review Labs Reviewed - No data to display  Imaging Review No results found.   Visual Acuity Review  Right Eye Distance:   Left Eye Distance:   Bilateral Distance:    Right Eye Near:   Left Eye Near:    Bilateral Near:  MDM   1. Otitis media, recurrence not specified, unspecified chronicity, unspecified laterality, unspecified otitis media type    Patient is advised to continue home symptomatic treatment. Prescription for zpak  sent pharmacy patient has indicated. Patient is advised that if there are new or worsening symptoms or attend the emergency department, or contact primary care provider. Instructions of care provided discharged home in stable condition.  THIS NOTE  WAS GENERATED USING A VOICE RECOGNITION SOFTWARE PROGRAM. ALL REASONABLE EFFORTS  WERE MADE TO PROOFREAD THIS DOCUMENT FOR ACCURACY.     Konrad Felix, [place] 08/13/15 1815

## 2015-09-03 ENCOUNTER — Encounter: Payer: Self-pay | Admitting: Family Medicine

## 2015-09-03 ENCOUNTER — Ambulatory Visit (INDEPENDENT_AMBULATORY_CARE_PROVIDER_SITE_OTHER): Payer: Self-pay | Admitting: Family Medicine

## 2015-09-03 VITALS — BP 133/76 | HR 67 | Temp 97.7°F | Resp 14 | Ht 59.0 in | Wt 175.0 lb

## 2015-09-03 DIAGNOSIS — R7303 Prediabetes: Secondary | ICD-10-CM

## 2015-09-03 DIAGNOSIS — E785 Hyperlipidemia, unspecified: Secondary | ICD-10-CM

## 2015-09-03 DIAGNOSIS — M25511 Pain in right shoulder: Secondary | ICD-10-CM

## 2015-09-03 DIAGNOSIS — Z23 Encounter for immunization: Secondary | ICD-10-CM

## 2015-09-03 DIAGNOSIS — E669 Obesity, unspecified: Secondary | ICD-10-CM

## 2015-09-03 DIAGNOSIS — K121 Other forms of stomatitis: Secondary | ICD-10-CM

## 2015-09-03 LAB — COMPLETE METABOLIC PANEL WITH GFR
ALT: 24 U/L (ref 6–29)
AST: 20 U/L (ref 10–35)
Albumin: 4.3 g/dL (ref 3.6–5.1)
Alkaline Phosphatase: 85 U/L (ref 33–115)
BUN: 10 mg/dL (ref 7–25)
CALCIUM: 9.6 mg/dL (ref 8.6–10.2)
CO2: 27 mmol/L (ref 20–31)
CREATININE: 0.74 mg/dL (ref 0.50–1.10)
Chloride: 104 mmol/L (ref 98–110)
GFR, Est African American: >89 mL/min (ref >=60)
GFR, Est Non African American: >89 mL/min (ref >=60)
GLUCOSE: 114 mg/dL — AB (ref 65–99)
POTASSIUM: 3.9 mmol/L (ref 3.5–5.3)
SODIUM: 141 mmol/L (ref 135–146)
Total Bilirubin: 0.4 mg/dL (ref 0.2–1.2)
Total Protein: 7.5 g/dL (ref 6.1–8.1)

## 2015-09-03 LAB — CBC WITH DIFFERENTIAL/PLATELET
Basophils Absolute: 0 10*3/uL (ref 0.0–0.1)
Basophils Relative: 0 % (ref 0–1)
EOS PCT: 1 % (ref 0–5)
Eosinophils Absolute: 0.1 10*3/uL (ref 0.0–0.7)
HEMATOCRIT: 45 % (ref 36.0–46.0)
HEMOGLOBIN: 15.3 g/dL — AB (ref 12.0–15.0)
Lymphocytes Relative: 50 % — ABNORMAL HIGH (ref 12–46)
Lymphs Abs: 4.2 10*3/uL — ABNORMAL HIGH (ref 0.7–4.0)
MCH: 28.4 pg (ref 26.0–34.0)
MCHC: 34 g/dL (ref 30.0–36.0)
MCV: 83.6 fL (ref 78.0–100.0)
MONO ABS: 0.5 10*3/uL (ref 0.1–1.0)
MONOS PCT: 6 % (ref 3–12)
MPV: 9.4 fL (ref 8.6–12.4)
NEUTROS ABS: 3.6 10*3/uL (ref 1.7–7.7)
Neutrophils Relative %: 43 % (ref 43–77)
Platelets: 362 10*3/uL (ref 150–400)
RBC: 5.38 MIL/uL — ABNORMAL HIGH (ref 3.87–5.11)
RDW: 14.4 % (ref 11.5–15.5)
WBC: 8.4 10*3/uL (ref 4.0–10.5)

## 2015-09-03 LAB — POCT URINALYSIS DIP (DEVICE)
Bilirubin Urine: NEGATIVE
Glucose, UA: NEGATIVE mg/dL
Ketones, ur: NEGATIVE mg/dL
LEUKOCYTES UA: NEGATIVE
NITRITE: NEGATIVE
Protein, ur: NEGATIVE mg/dL
Specific Gravity, Urine: >=1.03 (ref 1.005–1.030)
UROBILINOGEN UA: 0.2 mg/dL (ref 0.0–1.0)
pH: 5.5 (ref 5.0–8.0)

## 2015-09-03 MED ORDER — ACETAMINOPHEN-CODEINE #3 300-30 MG PO TABS: 1.0000 | ORAL_TABLET | Freq: Three times a day (TID) | ORAL | Status: DC | PRN

## 2015-09-03 MED FILL — ACETAMINOPHEN/COD #3 TABLET: 300-30 | 10 days supply | Qty: 30 | Fill #0

## 2015-09-03 NOTE — Progress Notes (Signed)
Subjective:    Patient ID: Jessica Lawson, female    DOB: 09-18-1965, 50 y.o.   MRN: [id number]  HPI [name], a 50 year old female that presents to establish care. [name] states that she was a patient Dr. Antony Blackbird, she has not been followed greater than 1 year due to insurance constraints. She states that she has a history of prediabetes and hyperlipidemia and was taking Metformin 500 mg daily prior to loosing her job.  Patient denies foot ulcerations, increase appetite, nausea, paresthesia of the feet, polydipsia, polyuria, visual disturbances and weight loss.  Evaluation to date has been included: hemoglobin A1C.  Patient also complains of periodic mouth sores. She states that she typically gets mouth sores every few weeks. She associates mouth sores with eating citrus fruits. She denies fever, fatigue, abdominal pain, dysuria, nausea, vomiting, or diarrhea. She is currently not sexually active.   Patient is also complaining of right shoulder pain for greater than 1 month. She states that she has not injured right arm. Pain intensity is 6/10 described as intermittent and throbbing. Pain primarily occurs in the am. She maintains that pain is worsened by overhead lifting and lifting heavy objects. 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 She states that she has taken Tylenol with minimal relief.   Past Medical History  Diagnosis Date  . UTI (lower urinary tract infection)   . Diabetes mellitus without complication ([place])   . High blood cholesterol    Allergies  Allergen Reactions  . Asa [Aspirin]   . Iohexol      Desc: pt complains of difficulty swallowing and thickened tongue   . Penicillins   . Statins   . Sulfur    Immunization History  Administered Date(s) Administered  . Pneumococcal Polysaccharide-23 09/03/2015   Past Surgical History  Procedure Laterality Date  . Cesarean section     Review of  Systems  Constitutional: Negative.  Negative for fever and unexpected weight change.  HENT: Positive for mouth sores.   Eyes: Negative.   Respiratory: Negative.   Cardiovascular: Negative.  Negative for palpitations and leg swelling.  Gastrointestinal: Negative.   Endocrine: Negative.  Negative for polydipsia, polyphagia and polyuria.  Genitourinary: Negative.   Musculoskeletal: Negative.   Skin: Negative.   Allergic/Immunologic: Negative.   Neurological: Negative.   Hematological: Negative.   Psychiatric/Behavioral: Negative.       Objective:   Physical Exam  Constitutional: She is oriented to person, place, and time. She appears well-developed and well-nourished.  HENT:  Head: Normocephalic and atraumatic.  Right Ear: External ear normal.  Left Ear: External ear normal.  Mouth/Throat: Oropharynx is clear and moist.  Eyes: Conjunctivae and EOM are normal. Pupils are equal, round, and reactive to light.  Neck: Normal range of motion. Neck supple.  Cardiovascular: Normal rate, normal heart sounds and intact distal pulses.   Pulmonary/Chest: Effort normal and breath sounds normal.  Abdominal: Soft. Bowel sounds are normal.  Musculoskeletal: Normal range of motion.  Neurological: She is alert and oriented to person, place, and time. She has normal reflexes.  Skin: Skin is warm and dry.  Psychiatric: She has a normal mood and affect. Her behavior is normal. Judgment and thought content normal.       BP 133/76 mmHg  Pulse 67  Temp(Src) 97.7 F (36.5 C) (Oral)  Resp 14  Ht 4\' 11"  (1.499 Lawson)  Wt 175 lb (79.379 kg)  BMI  35.33 kg/m2  LMP 08/22/2011 Assessment & Plan:   1. Prediabetes Recommend a lowfat, low carbohydrate diet divided over 5-6 meals. Discussed diet and exercise regimen at length.  - Hemoglobin A1c  - COMPLETE METABOLIC PANEL WITH GFR - POCT urinalysis dipstick  2. Hyperlipidemia -lipid panel; future  3. Obesity I suspect metabolic syndrome due to BMI of  35, abdominal obesity, prediabetes and hx of hyperlipidemia. Recommend a lowfat, low carbohydrate diet divided over 5-6 small meals, increase water intake to 6-8 glasses, and 150 minutes per week of cardiovascular exercise.   - TSH - COMPLETE METABOLIC PANEL WITH GFR - CBC with Differential  4. Right shoulder pain Patient has an allergy to aspirin. She states that she she takes aspirin, she typically has throat swelling. Will not start NSaids for right shoulder pain. Will start a trial of Tylenol with codeine every 8 hours as needed for moderate to severe pain. Apply warm, moist compresses to right shoulder. Refrain from lifting objects greater than 20 pounds.  - acetaminophen-codeine (TYLENOL #3) 300-30 MG tablet; Take 1 tablet by mouth every 8 (eight) hours as needed for moderate pain.  Dispense: 30 tablet; Refill: 0  5. Mouth ulcers  - HSV(herpes simplex vrs) 1+2 ab-IgG  6. Immunization due  - Pneumococcal polysaccharide vaccine 23-valent greater than or equal to 2yo subcutaneous/IM   Routine Health Maintenance:  Balanced diet Pap smear: 2 years ago Recommend a yearly mammogram   Jessica Mangen M, FNP    The patient was given clear instructions to go to ER or return to medical center if symptoms do not improve, worsen or new problems develop. The patient verbalized understanding. Will notify patient with laboratory results.

## 2015-09-03 NOTE — Patient Instructions (Addendum)
Recommend a lowfat, low carbohydrate diet divided over 5-6 small meals, increase water intake to 6-8 glasses, and 150 minutes per week of cardiovascular exercise.  Fat and Cholesterol Restricted Diet High levels of fat and cholesterol in your blood may lead to various health problems, such as diseases of the heart, blood vessels, gallbladder, liver, and pancreas. Fats are concentrated sources of energy that come in various forms. Certain types of fat, including saturated fat, may be harmful in excess. Cholesterol is a substance needed by your body in small amounts. Your body makes all the cholesterol it needs. Excess cholesterol comes from the food you eat. When you have high levels of cholesterol and saturated fat in your blood, health problems can develop because the excess fat and cholesterol will gather along the walls of your blood vessels, causing them to narrow. Choosing the right foods will help you control your intake of fat and cholesterol. This will help keep the levels of these substances in your blood within normal limits and reduce your risk of disease. WHAT IS MY PLAN? Your health care provider recommends that you:  Get no more than __________ % of the total calories in your daily diet from fat.  Limit your intake of saturated fat to less than ______% of your total calories each day.  Limit the amount of cholesterol in your diet to less than _________mg per day. WHAT TYPES OF FAT SHOULD I CHOOSE?  Choose healthy fats more often. Choose monounsaturated and polyunsaturated fats, such as olive and canola oil, flaxseeds, walnuts, almonds, and seeds.  Eat more omega-3 fats. Good choices include salmon, mackerel, sardines, tuna, flaxseed oil, and ground flaxseeds. Aim to eat fish at least two times a week.  Limit saturated fats. Saturated fats are primarily found in animal products, such as meats, butter, and cream. Plant sources of saturated fats include palm oil, palm kernel oil, and  coconut oil.  Avoid foods with partially hydrogenated oils in them. These contain trans fats. Examples of foods that contain trans fats are stick margarine, some tub margarines, cookies, crackers, and other baked goods. WHAT GENERAL GUIDELINES DO I NEED TO FOLLOW? These guidelines for healthy eating will help you control your intake of fat and cholesterol:  Check food labels carefully to identify foods with trans fats or high amounts of saturated fat.  Fill one half of your plate with vegetables and green salads.  Fill one fourth of your plate with whole grains. Look for the word "whole" as the first word in the ingredient list.  Fill one fourth of your plate with lean protein foods.  Limit fruit to two servings a day. Choose fruit instead of juice.  Eat more foods that contain soluble fiber. Examples of foods that contain this type of fiber are apples, broccoli, carrots, beans, peas, and barley. Aim to get 20-30 g of fiber per day.  Eat more home-cooked food and less restaurant, buffet, and fast food.  Limit or avoid alcohol.  Limit foods high in starch and sugar.  Limit fried foods.  Cook foods using methods other than frying. Baking, boiling, grilling, and broiling are all great options.  Lose weight if you are overweight. Losing just 5-10% of your initial body weight can help your overall health and prevent diseases such as diabetes and heart disease. WHAT FOODS CAN I EAT? Grains Whole grains, such as whole wheat or whole grain breads, crackers, cereals, and pasta. Unsweetened oatmeal, bulgur, barley, quinoa, or brown rice. Corn or whole wheat  flour tortillas. Vegetables Fresh or frozen vegetables (raw, steamed, roasted, or grilled). Green salads. Fruits All fresh, canned (in natural juice), or frozen fruits. Meat and Other Protein Products Ground beef (85% or leaner), grass-fed beef, or beef trimmed of fat. Skinless chicken or [place]. Ground chicken or [place]. Pork trimmed  of fat. All fish and seafood. Eggs. Dried beans, peas, or lentils. Unsalted nuts or seeds. Unsalted canned or dry beans. Dairy Low-fat dairy products, such as skim or 1% milk, 2% or reduced-fat cheeses, low-fat ricotta or cottage cheese, or plain low-fat yogurt. Fats and Oils Tub margarines without trans fats. Light or reduced-fat mayonnaise and salad dressings. Avocado. Olive, canola, sesame, or safflower oils. Natural peanut or almond butter (choose ones without added sugar and oil). The items listed above may not be a complete list of recommended foods or beverages. Contact your dietitian for more options. WHAT FOODS ARE NOT RECOMMENDED? Grains White bread. White pasta. White rice. Cornbread. Bagels, pastries, and croissants. Crackers that contain trans fat. Vegetables White potatoes. Corn. Creamed or fried vegetables. Vegetables in a cheese sauce. Fruits Dried fruits. Canned fruit in light or heavy syrup. Fruit juice. Meat and Other Protein Products Fatty cuts of meat. Ribs, chicken wings, bacon, sausage, bologna, salami, chitterlings, fatback, hot dogs, bratwurst, and packaged luncheon meats. Liver and organ meats. Dairy Whole or 2% milk, cream, half-and-half, and cream cheese. Whole milk cheeses. Whole-fat or sweetened yogurt. Full-fat cheeses. Nondairy creamers and whipped toppings. Processed cheese, cheese spreads, or cheese curds. Sweets and Desserts Corn syrup, sugars, honey, and molasses. Candy. Jam and jelly. Syrup. Sweetened cereals. Cookies, pies, cakes, donuts, muffins, and ice cream. Fats and Oils Butter, stick margarine, lard, shortening, ghee, or bacon fat. Coconut, palm kernel, or palm oils. Beverages Alcohol. Sweetened drinks (such as sodas, lemonade, and fruit drinks or punches). The items listed above may not be a complete list of foods and beverages to avoid. Contact your dietitian for more information.   This information is not intended to replace advice given to you  by your health care provider. Make sure you discuss any questions you have with your health care provider.   Document Released: 08/01/2005 Document Revised: 08/22/2014 Document Reviewed: 10/30/2013 Elsevier Interactive Patient Education 2016 [employer]. Exercising to [employer] Exercising can help you to lose weight. In order to lose weight through exercise, you need to do vigorous-intensity exercise. You can tell that you are exercising with vigorous intensity if you are breathing very hard and fast and cannot hold a conversation while exercising. Moderate-intensity exercise helps to maintain your current weight. You can tell that you are exercising at a moderate level if you have a higher heart rate and faster breathing, but you are still able to hold a conversation. HOW OFTEN SHOULD I EXERCISE? Choose an activity that you enjoy and set realistic goals. Your health care provider can help you to make an activity plan that works for you. Exercise regularly as directed by your health care provider. This may include:  Doing resistance training twice each week, such as:  Push-ups.  Sit-ups.  Lifting weights.  Using resistance bands.  Doing a given intensity of exercise for a given amount of time. Choose from these options:  150 minutes of moderate-intensity exercise every week.  75 minutes of vigorous-intensity exercise every week.  A mix of moderate-intensity and vigorous-intensity exercise every week. Children, pregnant women, people who are out of shape, people who are overweight, and older adults may need to consult a health  care provider for individual recommendations. If you have any sort of medical condition, be sure to consult your health care provider before starting a new exercise program. WHAT ARE SOME ACTIVITIES THAT CAN HELP ME TO LOSE WEIGHT?   Walking at a rate of at least 4.5 miles an hour.  Jogging or running at a rate of 5 miles per hour.  Biking at a rate of at  least 10 miles per hour.  Lap swimming.  Roller-skating or in-line skating.  Cross-country skiing.  Vigorous competitive sports, such as football, basketball, and soccer.  Jumping rope.  Aerobic dancing. HOW CAN I BE MORE ACTIVE IN MY DAY-TO-DAY ACTIVITIES?  Use the stairs instead of the elevator.  Take a walk during your lunch break.  If you drive, park your car farther away from work or school.  If you take public transportation, get off one stop early and walk the rest of the way.  Make all of your phone calls while standing up and walking around.  Get up, stretch, and walk around every 30 minutes throughout the day. WHAT GUIDELINES SHOULD I FOLLOW WHILE EXERCISING?  Do not exercise so much that you hurt yourself, feel dizzy, or get very short of breath.  Consult your health care provider prior to starting a new exercise program.  Wear comfortable clothes and shoes with good support.  Drink plenty of water while you exercise to prevent dehydration or heat stroke. Body water is lost during exercise and must be replaced.  Work out until you breathe faster and your heart beats faster.   This information is not intended to replace advice given to you by your health care provider. Make sure you discuss any questions you have with your health care provider.   Document Released: 09/03/2010 Document Revised: 08/22/2014 Document Reviewed: 01/02/2014 Elsevier Interactive Patient Education 2016 [place] Choices to Lower Your Triglycerides Triglycerides are a type of fat in your blood. High levels of triglycerides can increase the risk of heart disease and stroke. If your triglyceride levels are high, the foods you eat and your eating habits are very important. Choosing the right foods can help lower your triglycerides.  WHAT GENERAL GUIDELINES DO I NEED TO FOLLOW?  Lose weight if you are overweight.   Limit or avoid alcohol.   Fill one half of your plate with  vegetables and green salads.   Limit fruit to two servings a day. Choose fruit instead of juice.   Make one fourth of your plate whole grains. Look for the word "whole" as the first word in the ingredient list.  Fill one fourth of your plate with lean protein foods.  Enjoy fatty fish (such as salmon, mackerel, sardines, and tuna) three times a week.   Choose healthy fats.   Limit foods high in starch and sugar.  Eat more home-cooked food and less restaurant, buffet, and fast food.  Limit fried foods.  Cook foods using methods other than frying.  Limit saturated fats.  Check ingredient lists to avoid foods with partially hydrogenated oils (trans fats) in them. WHAT FOODS CAN I EAT?  Grains Whole grains, such as whole wheat or whole grain breads, crackers, cereals, and pasta. Unsweetened oatmeal, bulgur, barley, quinoa, or brown rice. Corn or whole wheat flour tortillas.  Vegetables Fresh or frozen vegetables (raw, steamed, roasted, or grilled). Green salads. Fruits All fresh, canned (in natural juice), or frozen fruits. Meat and Other Protein Products Ground beef (85% or leaner), grass-fed beef, or beef  trimmed of fat. Skinless chicken or [place]. Ground chicken or [place]. Pork trimmed of fat. All fish and seafood. Eggs. Dried beans, peas, or lentils. Unsalted nuts or seeds. Unsalted canned or dry beans. Dairy Low-fat dairy products, such as skim or 1% milk, 2% or reduced-fat cheeses, low-fat ricotta or cottage cheese, or plain low-fat yogurt. Fats and Oils Tub margarines without trans fats. Light or reduced-fat mayonnaise and salad dressings. Avocado. Safflower, olive, or canola oils. Natural peanut or almond butter. The items listed above may not be a complete list of recommended foods or beverages. Contact your dietitian for more options. WHAT FOODS ARE NOT RECOMMENDED?  Grains White bread. White pasta. White rice. Cornbread. Bagels, pastries, and croissants. Crackers that  contain trans fat. Vegetables White potatoes. Corn. Creamed or fried vegetables. Vegetables in a cheese sauce. Fruits Dried fruits. Canned fruit in light or heavy syrup. Fruit juice. Meat and Other Protein Products Fatty cuts of meat. Ribs, chicken wings, bacon, sausage, bologna, salami, chitterlings, fatback, hot dogs, bratwurst, and packaged luncheon meats. Dairy Whole or 2% milk, cream, half-and-half, and cream cheese. Whole-fat or sweetened yogurt. Full-fat cheeses. Nondairy creamers and whipped toppings. Processed cheese, cheese spreads, or cheese curds. Sweets and Desserts Corn syrup, sugars, honey, and molasses. Candy. Jam and jelly. Syrup. Sweetened cereals. Cookies, pies, cakes, donuts, muffins, and ice cream. Fats and Oils Butter, stick margarine, lard, shortening, ghee, or bacon fat. Coconut, palm kernel, or palm oils. Beverages Alcohol. Sweetened drinks (such as sodas, lemonade, and fruit drinks or punches). The items listed above may not be a complete list of foods and beverages to avoid. Contact your dietitian for more information.   This information is not intended to replace advice given to you by your health care provider. Make sure you discuss any questions you have with your health care provider.   Document Released: 05/19/2004 Document Revised: 08/22/2014 Document Reviewed: 06/05/2013 Elsevier Interactive Patient Education 2016 Else[employer] Inc[employer] Shoulder Pain The shoulder is the joint that connects your arms to your body. The bones that form the shoulder joint include the upper arm bone (humerus), the shoulder blade (scapula), and the collarbone (clavicle). The top of the humerus is shaped like a ball and fits into a rather flat socket on the scapula (glenoid cavity). A combination of muscles and strong, fibrous tissues that connect muscles to bones (tendons) support your shoulder joint and hold the ball in the socket. Small, fluid-filled sacs (bursae) are located in different  areas of the joint. They act as cushions between the bones and the overlying soft tissues and help reduce friction between the gliding tendons and the bone as you move your arm. Your shoulder joint allows a wide range of motion in your arm. This range of motion allows you to do things like scratch your back or throw a ball. However, this range of motion also makes your shoulder more prone to pain from overuse and injury. Causes of shoulder pain can originate from both injury and overuse and usually can be grouped in the following four categories:  Redness, swelling, and pain (inflammation) of the tendon (tendinitis) or the bursae (bursitis).  Instability, such as a dislocation of the joint.  Inflammation of the joint (arthritis).  Broken bone (fracture). HOME CARE INSTRUCTIONS   Apply ice to the sore area.  Put ice in a plastic bag.  Place a towel between your skin and the bag.  Leave the ice on for 15-20 minutes, 3-4 times per day for the first 2 days,  or as directed by your health care provider.  Stop using cold packs if they do not help with the pain.  If you have a shoulder sling or immobilizer, wear it as long as your caregiver instructs. Only remove it to shower or bathe. Move your arm as little as possible, but keep your hand moving to prevent swelling.  Squeeze a soft ball or foam pad as much as possible to help prevent swelling.  Only take over-the-counter or prescription medicines for pain, discomfort, or fever as directed by your caregiver. SEEK MEDICAL CARE IF:   Your shoulder pain increases, or new pain develops in your arm, hand, or fingers.  Your hand or fingers become cold and numb.  Your pain is not relieved with medicines. SEEK IMMEDIATE MEDICAL CARE IF:   Your arm, hand, or fingers are numb or tingling.  Your arm, hand, or fingers are significantly swollen or turn white or blue. MAKE SURE YOU:   Understand these instructions.  Will watch your  condition.  Will get help right away if you are not doing well or get worse.   This information is not intended to replace advice given to you by your health care provider. Make sure you discuss any questions you have with your health care provider.   Document Released: 05/11/2005 Document Revised: 08/22/2014 Document Reviewed: 11/24/2014 Elsevier Interactive Patient Education [employer].

## 2015-09-04 ENCOUNTER — Other Ambulatory Visit: Payer: Self-pay | Admitting: Family Medicine

## 2015-09-04 DIAGNOSIS — R7303 Prediabetes: Secondary | ICD-10-CM

## 2015-09-04 LAB — HSV(HERPES SIMPLEX VRS) I + II AB-IGG
HSV 1 Glycoprotein G Ab, IgG: 7.93 IV — ABNORMAL HIGH
HSV 2 Glycoprotein G Ab, IgG: 6.5 IV — ABNORMAL HIGH

## 2015-09-04 LAB — HEMOGLOBIN A1C
HEMOGLOBIN A1C: 6.5 % — AB (ref <5.7)
Mean Plasma Glucose: 140 mg/dL — ABNORMAL HIGH (ref <117)

## 2015-09-04 LAB — TSH: TSH: 0.661 u[IU]/mL (ref 0.350–4.500)

## 2015-09-04 MED ORDER — METFORMIN HCL 500 MG PO TABS: 500.0000 mg | ORAL_TABLET | Freq: Two times a day (BID) | ORAL | Status: DC

## 2015-09-04 MED FILL — ?METFORMIN HCL 500MG TABLET: 500 | 30 days supply | Qty: 60 | Fill #0

## 2015-09-05 DIAGNOSIS — E785 Hyperlipidemia, unspecified: Secondary | ICD-10-CM | POA: Insufficient documentation

## 2015-09-05 DIAGNOSIS — K121 Other forms of stomatitis: Secondary | ICD-10-CM | POA: Insufficient documentation

## 2015-09-05 DIAGNOSIS — M25511 Pain in right shoulder: Secondary | ICD-10-CM | POA: Insufficient documentation

## 2015-09-05 DIAGNOSIS — E669 Obesity, unspecified: Secondary | ICD-10-CM | POA: Insufficient documentation

## 2015-09-18 ENCOUNTER — Other Ambulatory Visit (INDEPENDENT_AMBULATORY_CARE_PROVIDER_SITE_OTHER): Payer: Self-pay

## 2015-09-18 DIAGNOSIS — E785 Hyperlipidemia, unspecified: Secondary | ICD-10-CM

## 2015-09-18 LAB — LIPID PANEL
CHOL/HDL RATIO: 12.9 ratio — AB (ref <=5.0)
Cholesterol: 375 mg/dL — ABNORMAL HIGH (ref 125–200)
HDL: 29 mg/dL — ABNORMAL LOW (ref >=46)
Triglycerides: 492 mg/dL — ABNORMAL HIGH (ref <150)

## 2015-09-20 ENCOUNTER — Other Ambulatory Visit: Payer: Self-pay | Admitting: Family Medicine

## 2015-09-20 DIAGNOSIS — E782 Mixed hyperlipidemia: Secondary | ICD-10-CM | POA: Insufficient documentation

## 2015-09-20 MED ORDER — FENOFIBRATE 145 MG PO TABS: 145.0000 mg | ORAL_TABLET | Freq: Every day | ORAL | Status: DC

## 2015-09-21 NOTE — Progress Notes (Signed)
Called and left message for patient to call back regarding labs.Thansk!

## 2015-09-21 NOTE — Progress Notes (Signed)
Patient returned call and I advised of labs and to stop atorvastatin and to start tricor as directed. Advised patient to eat 5 to 6 small meals daily, drink 6 to 8 glasses of water, and to exercise 150 minutes weekly. Appointment was scheduled for patient to have a fasting lipid in 3 months. Thanks!

## 2015-10-12 ENCOUNTER — Ambulatory Visit (INDEPENDENT_AMBULATORY_CARE_PROVIDER_SITE_OTHER): Payer: No Typology Code available for payment source | Admitting: Family Medicine

## 2015-10-12 ENCOUNTER — Encounter: Payer: Self-pay | Admitting: Family Medicine

## 2015-10-12 VITALS — BP 128/82 | HR 68 | Temp 97.8°F | Resp 14 | Ht 59.0 in | Wt 171.0 lb

## 2015-10-12 DIAGNOSIS — R7303 Prediabetes: Secondary | ICD-10-CM

## 2015-10-12 DIAGNOSIS — E669 Obesity, unspecified: Secondary | ICD-10-CM

## 2015-10-12 DIAGNOSIS — E782 Mixed hyperlipidemia: Secondary | ICD-10-CM

## 2015-10-12 MED ORDER — METFORMIN HCL 500 MG PO TABS
500.0000 mg | ORAL_TABLET | Freq: Two times a day (BID) | ORAL | Status: DC
Start: 2015-10-12 — End: 2016-01-13

## 2015-10-12 MED FILL — ?METFORMIN HCL 500MG TABLET: 500 | 30 days supply | Qty: 60 | Fill #0

## 2015-10-12 NOTE — Progress Notes (Signed)
Subjective:    Patient ID: Jessica Lawson, female    DOB: [birth year], 50 y.o.   MRN: [id number]  HPI Jessica Lawson, a 50 year old female that presents for a follow up of hyperlipidemia and pre-diabetes. [name] states that she was unable to get Tricor for cholesterol due to financial constraints. .She also has a history of prediabetes. Previous hemoglobin a1c was 6.5, patient has been unable to take Metformin 500 mg BID. She maintains that she has been primarily taking medication at night. She says that she feels sluggish after taking morning dosage of metformin.   Patient denies foot ulcerations, increase appetite, nausea, paresthesia of the feet, polydipsia, polyuria, visual disturbances and weight loss.  Evaluation to date has been included: hemoglobin A1C.   Past Medical History  Diagnosis Date  . UTI (lower urinary tract infection)   . Diabetes mellitus without complication ([place])   . High blood cholesterol    Allergies  Allergen Reactions  . Asa [Aspirin]   . Iohexol      Desc: pt complains of difficulty swallowing and thickened tongue   . Penicillins   . Statins   . Sulfur    Immunization History  Administered Date(s) Administered  . Pneumococcal Polysaccharide-23 09/03/2015   Past Surgical History  Procedure Laterality Date  . Cesarean section     Review of Systems  Constitutional: Negative.  Negative for fever and unexpected weight change.  Eyes: Negative.   Respiratory: Negative.   Cardiovascular: Negative.  Negative for palpitations and leg swelling.  Gastrointestinal: Negative.   Endocrine: Negative.  Negative for polydipsia, polyphagia and polyuria.  Genitourinary: Negative.   Musculoskeletal: Negative.   Skin: Negative.   Allergic/Immunologic: Negative.   Neurological: Negative.   Hematological: Negative.   Psychiatric/Behavioral: Negative.       Objective:   Physical Exam  Constitutional: She is oriented to person, place, and time. She appears  well-developed and well-nourished.  HENT:  Head: Normocephalic and atraumatic.  Right Ear: External ear normal.  Left Ear: External ear normal.  Mouth/Throat: Oropharynx is clear and moist.  Eyes: Conjunctivae and EOM are normal. Pupils are equal, round, and reactive to light.  Neck: Normal range of motion. Neck supple.  Cardiovascular: Normal rate, normal heart sounds and intact distal pulses.   Pulmonary/Chest: Effort normal and breath sounds normal.  Abdominal: Soft. Bowel sounds are normal.  Musculoskeletal: Normal range of motion.  Neurological: She is alert and oriented to person, place, and time. She has normal reflexes.  Skin: Skin is warm and dry.  Psychiatric: She has a normal mood and affect. Her behavior is normal. Judgment and thought content normal.       BP 128/82 mmHg  Pulse 68  Temp(Src) 97.8 F (36.6 C) (Oral)  Resp 14  Ht 4\' 11"  (1.499 m)  Wt 171 lb (77.565 kg)  BMI 34.52 kg/m2  LMP 08/22/2011 Assessment & Plan:  1. Hypercholesterolemia with hypertriglyceridemia Patient was unable to obtain medication due to financial constraints. Explained the importance of taking medications and following a low fat diet. She expressed understanding.  - Lipid Panel; Future  2. Prediabetes Patient is to continue to take medication in evenings. Will check hemoglobin a1C in 3 months.  - metFORMIN (GLUCOPHAGE) 500 MG tablet; Take 1 tablet (500 mg total) by mouth 2 (two) times daily with a meal. Reported on 09/03/2015  Dispense: 60 tablet; Refill: 3  3. Obesity Patient has decreased weight by 4 pounds since previous visit. Recommend a lowfat,  low carbohydrate diet divided over 5-6 small meals, increase water intake to 6-8 glasses, and 150 minutes per week of cardiovascular exercise.      Routine Health Maintenance:  Discussed previous laboratory results at length Will repeat pap smear Previously sent referral for mammogram Will need an opthalmology evaluation.    RTC: 3  months for follow up of hyperlipidemia and prediabetes  Knute Mazzuca M, FNP    The patient was given clear instructions to go to ER or return to medical center if symptoms do not improve, worsen or new problems develop. The patient verbalized understanding. Will notify patient with laboratory results.

## 2015-10-12 NOTE — Patient Instructions (Signed)
Fat and Cholesterol Restricted Diet High levels of fat and cholesterol in your blood may lead to various health problems, such as diseases of the heart, blood vessels, gallbladder, liver, and pancreas. Fats are concentrated sources of energy that come in various forms. Certain types of fat, including saturated fat, may be harmful in excess. Cholesterol is a substance needed by your body in small amounts. Your body makes all the cholesterol it needs. Excess cholesterol comes from the food you eat. When you have high levels of cholesterol and saturated fat in your blood, health problems can develop because the excess fat and cholesterol will gather along the walls of your blood vessels, causing them to narrow. Choosing the right foods will help you control your intake of fat and cholesterol. This will help keep the levels of these substances in your blood within normal limits and reduce your risk of disease. WHAT IS MY PLAN? Your health care provider recommends that you:  Get no more than __________ % of the total calories in your daily diet from fat.  Limit your intake of saturated fat to less than ______% of your total calories each day.  Limit the amount of cholesterol in your diet to less than _________mg per day. WHAT TYPES OF FAT SHOULD I CHOOSE?  Choose healthy fats more often. Choose monounsaturated and polyunsaturated fats, such as olive and canola oil, flaxseeds, walnuts, almonds, and seeds.  Eat more omega-3 fats. Good choices include salmon, mackerel, sardines, tuna, flaxseed oil, and ground flaxseeds. Aim to eat fish at least two times a week.  Limit saturated fats. Saturated fats are primarily found in animal products, such as meats, butter, and cream. Plant sources of saturated fats include palm oil, palm kernel oil, and coconut oil.  Avoid foods with partially hydrogenated oils in them. These contain trans fats. Examples of foods that contain trans fats are stick margarine, some tub  margarines, cookies, crackers, and other baked goods. WHAT GENERAL GUIDELINES DO I NEED TO FOLLOW? These guidelines for healthy eating will help you control your intake of fat and cholesterol:  Check food labels carefully to identify foods with trans fats or high amounts of saturated fat.  Fill one half of your plate with vegetables and green salads.  Fill one fourth of your plate with whole grains. Look for the word "whole" as the first word in the ingredient list.  Fill one fourth of your plate with lean protein foods.  Limit fruit to two servings a day. Choose fruit instead of juice.  Eat more foods that contain soluble fiber. Examples of foods that contain this type of fiber are apples, broccoli, carrots, beans, peas, and barley. Aim to get 20-30 g of fiber per day.  Eat more home-cooked food and less restaurant, buffet, and fast food.  Limit or avoid alcohol.  Limit foods high in starch and sugar.  Limit fried foods.  Cook foods using methods other than frying. Baking, boiling, grilling, and broiling are all great options.  Lose weight if you are overweight. Losing just 5-10% of your initial body weight can help your overall health and prevent diseases such as diabetes and heart disease. WHAT FOODS CAN I EAT? Grains Whole grains, such as whole wheat or whole grain breads, crackers, cereals, and pasta. Unsweetened oatmeal, bulgur, barley, quinoa, or brown rice. Corn or whole wheat flour tortillas. Vegetables Fresh or frozen vegetables (raw, steamed, roasted, or grilled). Green salads. Fruits All fresh, canned (in natural juice), or frozen fruits. Meat and  Other Protein Products Ground beef (85% or leaner), grass-fed beef, or beef trimmed of fat. Skinless chicken or [place]. Ground chicken or [place]. Pork trimmed of fat. All fish and seafood. Eggs. Dried beans, peas, or lentils. Unsalted nuts or seeds. Unsalted canned or dry beans. Dairy Low-fat dairy products, such as skim or  1% milk, 2% or reduced-fat cheeses, low-fat ricotta or cottage cheese, or plain low-fat yogurt. Fats and Oils Tub margarines without trans fats. Light or reduced-fat mayonnaise and salad dressings. Avocado. Olive, canola, sesame, or safflower oils. Natural peanut or almond butter (choose ones without added sugar and oil). The items listed above may not be a complete list of recommended foods or beverages. Contact your dietitian for more options. WHAT FOODS ARE NOT RECOMMENDED? Grains White bread. White pasta. White rice. Cornbread. Bagels, pastries, and croissants. Crackers that contain trans fat. Vegetables White potatoes. Corn. Creamed or fried vegetables. Vegetables in a cheese sauce. Fruits Dried fruits. Canned fruit in light or heavy syrup. Fruit juice. Meat and Other Protein Products Fatty cuts of meat. Ribs, chicken wings, bacon, sausage, bologna, salami, chitterlings, fatback, hot dogs, bratwurst, and packaged luncheon meats. Liver and organ meats. Dairy Whole or 2% milk, cream, half-and-half, and cream cheese. Whole milk cheeses. Whole-fat or sweetened yogurt. Full-fat cheeses. Nondairy creamers and whipped toppings. Processed cheese, cheese spreads, or cheese curds. Sweets and Desserts Corn syrup, sugars, honey, and molasses. Candy. Jam and jelly. Syrup. Sweetened cereals. Cookies, pies, cakes, donuts, muffins, and ice cream. Fats and Oils Butter, stick margarine, lard, shortening, ghee, or bacon fat. Coconut, palm kernel, or palm oils. Beverages Alcohol. Sweetened drinks (such as sodas, lemonade, and fruit drinks or punches). The items listed above may not be a complete list of foods and beverages to avoid. Contact your dietitian for more information.   This information is not intended to replace advice given to you by your health care provider. Make sure you discuss any questions you have with your health care provider.   Document Released: 08/01/2005 Document Revised: 08/22/2014  Document Reviewed: 10/30/2013 Elsevier Interactive Patient Education 2016 Else[employer] Inc[employer] Dyslipidemia Dyslipidemia is an imbalance of the lipids in your blood. Lipids are waxy, fat-like proteins that your body needs in small amounts. Dyslipidemia often involves the lipids cholesterol or triglycerides. Common forms of dyslipidemia are:  High levels of bad cholesterol (LDL cholesterol). LDL cholesterol is the type of cholesterol that causes heart disease.  Low levels of good cholesterol (HDL cholesterol). HDL cholesterol is the type of cholesterol that helps protect against heart disease.  High levels of triglycerides. Triglycerides are a fatty substance in the blood linked to a buildup of plaque on your arteries. RISK FACTORS  Increased age.  Having a family history of high cholesterol.  Certain medicines, including birth control pills, diuretics, beta-blockers, and some medicines for depression.  Smoking.  Eating a high-fat diet.  Being overweight.  Medical conditions such as diabetes, polycystic ovary syndrome, pregnancy, kidney disease, and hypothyroidism.  Lack of regular exercise. SIGNS AND SYMPTOMS There are no signs or symptoms with dyslipidemia. DIAGNOSIS A simple blood test called a fasting blood test can be done to determine your level of:  Total cholesterol. This is the combined number of LDL cholesterol and HDL cholesterol. A healthy number is lower than 200.  LDL cholesterol. The goal number for LDL cholesterol is different for each person depending on risk factors. Ask your health care provider what your LDL cholesterol number should be.  HDL cholesterol. A healthy level of  HDL cholesterol is 60 or higher. A number lower than 40 for men or 50 for women is a danger sign.  Triglycerides. A healthy triglyceride number is less than 150. TREATMENT Dyslipidemia is a treatable condition. Your health care provider will advise you on what type of treatment is best based on  your age, your test results, and current guidelines. Treatment may include:  Dietary changes. A dietitian may help you create a diet that is based on your risk factors, conditions, and lifestyle.  Regular exercise. This can help lower your LDL cholesterol, raise your HDL cholesterol, and help with weight management. Check with your health care provider before beginning an exercise program. Most people should participate in 30 minutes of brisk exercise 5 days a week.  Quitting smoking.  Medicines to lower LDL cholesterol and triglycerides.  If you have high levels of triglycerides, your health care provider may:  Have you stop drinking alcohol.  Have you restrict your fat intake.  Have you eliminate refined sugars from your diet.  Treat you for other conditions, such as underactive thyroid gland (hypothyroidism) and high blood sugar (hyperglycemia). Your health care provider will monitor your lipid levels with regular blood tests. HOME CARE INSTRUCTIONS  Eat a healthy diet. Follow any diet instructions if they were given to you by your health care provider.  Maintain a healthy weight.  Exercise regularly based on the recommendations of your health care provider.  Do not use any tobacco products, including cigarettes, chewing tobacco, or electronic cigarettes.  Take medicines only as directed by your health care provider.  Keep all follow-up visits as directed by your health care provider. SEEK MEDICAL CARE IF: You are having possible side effects from your medicines.   This information is not intended to replace advice given to you by your health care provider. Make sure you discuss any questions you have with your health care provider.   Document Released: 08/06/2013 Document Revised: 08/22/2014 Document Reviewed: 08/06/2013 Elsevier Interactive Patient Education 2016 Elsevier Inc. Heart Disease Prevention Heart disease is a leading cause of death. There are many things you  can do to help prevent heart disease. BE PHYSICALLY ACTIVE Physical activity is good for your heart. It helps control your blood pressure, cholesterol levels, and weight. Try to be physically active every day. Ask your health care provider what activities are best for you.  BE A HEALTHY WEIGHT Extra weight can strain your heart and affect your blood pressure and cholesterol levels. Lose weight with diet and exercise if recommended by your health care provider. EAT HEART-HEALTHY FOODS Follow a healthy eating plan as recommended by your health care provider or dietitian. Heart-healthy foods include:   High-fiber foods. These include oat bran, oatmeal, and whole-grain breads and cereals.  Fruits and vegetables. Avoid:  Alcohol.  Fried foods.  Foods high in saturated fat. These include meats, butter, whole dairy products, shortening, and coconut or palm oil.  Salty foods. These include canned food, luncheon meat, salty snacks, and fast food. KEEP YOUR CHOLESTEROL LEVELS UNDER CONTROL Cholesterol is a substance that is used for many important functions. When your cholesterol levels are high, cholesterol can stick to the insides of your blood vessels, making them narrow or clog. This can lead to chest pain (angina) and a heart attack.  Keep your cholesterol levels under control as recommended by your health care provider. Have your cholesterol checked at least once a year. Target cholesterol levels (in mg/dL) for most people are:   Total  cholesterol below 200.  LDL cholesterol below 100.  HDL cholesterol above 40 in men and above 50 in women.  Triglycerides below 150. KEEP YOUR BLOOD PRESSURE UNDER CONTROL Having high blood pressure (hypertension) puts you at risk for stroke and other forms of heart disease. Keep your blood pressure under control as recommended by your health care provider. Ask your health care provider if you need treatment to lower your blood pressure. If you are 12-20  years of age, have your blood pressure checked every 3-5 years. If you are 66 years of age or older, have your blood pressure checked every year. DO NOT USE TOBACCO PRODUCTS Tobacco smoke can damage your heart and blood vessels. Do not use any tobacco products including cigarettes, chewing tobacco, or electronic cigarettes. If you need help quitting, ask your health care provider. TAKE MEDICINES AS DIRECTED Take medicines only as directed by your health care provider. Ask your health care provider whether you should take an aspirin every day. Taking aspirin can help reduce your risk of heart disease and stroke.  FOR MORE INFORMATION  To find out more about heart disease, visit the American Heart Association's website at www.americanheart.org   This information is not intended to replace advice given to you by your health care provider. Make sure you discuss any questions you have with your health care provider.   Document Released: 03/15/2004 Document Revised: 08/22/2014 Document Reviewed: 09/25/2013 Elsevier Interactive Patient Education [employer].

## 2015-10-14 ENCOUNTER — Other Ambulatory Visit: Payer: Self-pay

## 2015-10-14 DIAGNOSIS — Z1231 Encounter for screening mammogram for malignant neoplasm of breast: Secondary | ICD-10-CM

## 2015-10-26 ENCOUNTER — Other Ambulatory Visit: Payer: Self-pay | Admitting: Family Medicine

## 2015-10-26 DIAGNOSIS — Z9109 Other allergy status, other than to drugs and biological substances: Secondary | ICD-10-CM | POA: Insufficient documentation

## 2015-10-26 MED ORDER — CETIRIZINE HCL 10 MG PO CHEW: 10.0000 mg | CHEWABLE_TABLET | Freq: Every day | ORAL | Status: DC

## 2015-11-05 ENCOUNTER — Ambulatory Visit
Admission: RE | Admit: 2015-11-05 | Discharge: 2015-11-05 | Disposition: A | Discharge status: Home / Self Care | Payer: Medicaid Other | Source: Ambulatory Visit

## 2015-11-05 DIAGNOSIS — Z1231 Encounter for screening mammogram for malignant neoplasm of breast: Secondary | ICD-10-CM

## 2015-11-06 ENCOUNTER — Other Ambulatory Visit: Payer: Self-pay | Admitting: Family Medicine

## 2015-11-06 DIAGNOSIS — R928 Other abnormal and inconclusive findings on diagnostic imaging of breast: Secondary | ICD-10-CM

## 2015-11-17 ENCOUNTER — Other Ambulatory Visit: Payer: Medicaid Other

## 2015-12-02 ENCOUNTER — Ambulatory Visit
Admission: RE | Admit: 2015-12-02 | Discharge: 2015-12-02 | Disposition: A | Discharge status: Home / Self Care | Payer: Medicaid Other | Source: Ambulatory Visit | Attending: Family Medicine | Admitting: Family Medicine

## 2015-12-02 DIAGNOSIS — R928 Other abnormal and inconclusive findings on diagnostic imaging of breast: Secondary | ICD-10-CM

## 2016-01-05 ENCOUNTER — Other Ambulatory Visit (INDEPENDENT_AMBULATORY_CARE_PROVIDER_SITE_OTHER): Payer: Medicaid Other

## 2016-01-05 DIAGNOSIS — E782 Mixed hyperlipidemia: Secondary | ICD-10-CM

## 2016-01-05 LAB — LIPID PANEL
CHOL/HDL RATIO: 13.8 ratio — AB (ref <=5.0)
Cholesterol: 399 mg/dL — ABNORMAL HIGH (ref 125–200)
HDL: 29 mg/dL — ABNORMAL LOW (ref >=46)
Triglycerides: 603 mg/dL — ABNORMAL HIGH (ref <150)

## 2016-01-06 ENCOUNTER — Other Ambulatory Visit: Payer: Self-pay | Admitting: Family Medicine

## 2016-01-06 DIAGNOSIS — E782 Mixed hyperlipidemia: Secondary | ICD-10-CM

## 2016-01-06 MED ORDER — FENOFIBRATE 160 MG PO TABS: 160.0000 mg | ORAL_TABLET | Freq: Every day | ORAL | Status: DC

## 2016-01-06 MED ORDER — OMEGA-3-ACID ETHYL ESTERS 1 G PO CAPS: 2.0000 g | ORAL_CAPSULE | Freq: Two times a day (BID) | ORAL | Status: DC

## 2016-01-07 ENCOUNTER — Other Ambulatory Visit: Payer: Self-pay

## 2016-01-07 DIAGNOSIS — E782 Mixed hyperlipidemia: Secondary | ICD-10-CM

## 2016-01-07 MED ORDER — FENOFIBRATE 160 MG PO TABS: 160.0000 mg | ORAL_TABLET | Freq: Every day | ORAL | Status: DC

## 2016-01-07 MED ORDER — OMEGA-3-ACID ETHYL ESTERS 1 G PO CAPS: 2.0000 g | ORAL_CAPSULE | Freq: Two times a day (BID) | ORAL | Status: DC

## 2016-01-07 NOTE — Progress Notes (Signed)
Spoke with patient, advised of labs and to start taking new rx of fenofibrate and to start [place]. Patient verbalized understanding. Thanks!

## 2016-01-07 NOTE — Telephone Encounter (Signed)
Refilled rx's to correct pharmacy. Thanks!

## 2016-01-12 ENCOUNTER — Ambulatory Visit (INDEPENDENT_AMBULATORY_CARE_PROVIDER_SITE_OTHER): Payer: Medicaid Other | Admitting: Family Medicine

## 2016-01-12 ENCOUNTER — Encounter: Payer: Self-pay | Admitting: Family Medicine

## 2016-01-12 VITALS — BP 137/60 | HR 81 | Temp 98.2°F | Resp 14 | Ht 59.0 in | Wt 172.0 lb

## 2016-01-12 DIAGNOSIS — R7303 Prediabetes: Secondary | ICD-10-CM

## 2016-01-12 DIAGNOSIS — F17200 Nicotine dependence, unspecified, uncomplicated: Secondary | ICD-10-CM | POA: Insufficient documentation

## 2016-01-12 DIAGNOSIS — E782 Mixed hyperlipidemia: Secondary | ICD-10-CM

## 2016-01-12 LAB — HEMOGLOBIN A1C
HEMOGLOBIN A1C: 6.4 % — AB (ref <5.7)
MEAN PLASMA GLUCOSE: 137 mg/dL

## 2016-01-12 MED ORDER — FENOFIBRATE 160 MG PO TABS: 160.0000 mg | ORAL_TABLET | Freq: Every day | ORAL | Status: DC

## 2016-01-12 NOTE — Patient Instructions (Signed)

## 2016-01-12 NOTE — Progress Notes (Signed)
Subjective:    Patient ID: Jessica Lawson, female    DOB: [birth year], 50 y.o.   MRN: [id number]  HPI Jessica Lawson, a 50 year old female that presents for a follow up of hyperlipidemia and pre-diabetes. Jessica Lawson states that she has not been taking Tricor consistently. Patient's triglycerides are 603. She says that she has not been following a low fat diet as discussed during previous appointment. Her spouse has been frying foods lately and she has not been exercising.  She also has a history of prediabetes. Previous hemoglobin a1c was 6.5, patient has been unable to take Metformin 500 mg BID due to running out of medication. She maintains that she has been primarily taking medication at night.    Patient denies foot ulcerations, increase appetite, nausea, paresthesia of the feet, polydipsia, polyuria, visual disturbances and weight loss.  Evaluation to date has been included: hemoglobin A1C.   Past Medical History  Diagnosis Date  . UTI (lower urinary tract infection)   . Diabetes mellitus without complication ([place])   . High blood cholesterol    Allergies  Allergen Reactions  . Asa [Aspirin]   . Iohexol      Desc: pt complains of difficulty swallowing and thickened tongue   . Penicillins   . Statins   . Sulfur    Immunization History  Administered Date(s) Administered  . Pneumococcal Polysaccharide-23 09/03/2015   Past Surgical History  Procedure Laterality Date  . Cesarean section     Review of Systems  Constitutional: Negative.  Negative for fever and unexpected weight change.  Eyes: Negative.   Respiratory: Negative.   Cardiovascular: Negative.  Negative for palpitations and leg swelling.  Gastrointestinal: Negative.   Endocrine: Negative.  Negative for polydipsia, polyphagia and polyuria.  Genitourinary: Negative.   Musculoskeletal: Negative.   Skin: Negative.   Allergic/Immunologic: Negative.   Neurological: Negative.   Hematological: Negative.    Psychiatric/Behavioral: Negative.       Objective:   Physical Exam  Constitutional: She is oriented to person, place, and time. She appears well-developed and well-nourished.  HENT:  Head: Normocephalic and atraumatic.  Right Ear: External ear normal.  Left Ear: External ear normal.  Mouth/Throat: Oropharynx is clear and moist.  Eyes: Conjunctivae and EOM are normal. Pupils are equal, round, and reactive to light.  Neck: Normal range of motion. Neck supple.  Cardiovascular: Normal rate, normal heart sounds and intact distal pulses.   Pulmonary/Chest: Effort normal and breath sounds normal.  Abdominal: Soft. Bowel sounds are normal.  Musculoskeletal: Normal range of motion.  Neurological: She is alert and oriented to person, place, and time. She has normal reflexes.  Skin: Skin is warm and dry.  Psychiatric: She has a normal mood and affect. Her behavior is normal. Judgment and thought content normal.       BP 137/60 mmHg  Pulse 81  Temp(Src) 98.2 F (36.8 C) (Oral)  Resp 14  Ht 4\' 11"  (1.499 m)  Wt 172 lb (78.019 kg)  BMI 34.72 kg/m2  SpO2 98%  LMP 08/22/2011 Assessment & Plan:  1. Hypercholesterolemia with hypertriglyceridemia Discussed diet and cholesterol at length. The patient is asked to make an attempt to improve diet and exercise patterns to aid in medical management of this problem. - fenofibrate 160 MG tablet; Take 1 tablet (160 mg total) by mouth daily.  Dispense: 30 tablet; Refill: 11  2. Prediabetes  - Hemoglobin A1c  3. Tobacco dependence Smoking cessation instruction/counseling given:  counseled patient on the  dangers of tobacco use, advised patient to stop smoking, and reviewed strategies to maximize success   Routine Health Maintenance:  Discussed previous laboratory results at length Will repeat pap smear in 3 months Previously sent referral for mammogram Will need an opthalmology evaluation.    RTC: 3 months for follow up of hyperlipidemia and  prediabetes  Hollis,Lachina M, FNP    The patient was given clear instructions to go to ER or return to medical center if symptoms do not improve, worsen or new problems develop. The patient verbalized understanding. Will notify patient with laboratory results.

## 2016-01-13 ENCOUNTER — Other Ambulatory Visit: Payer: Self-pay

## 2016-01-13 DIAGNOSIS — R7303 Prediabetes: Secondary | ICD-10-CM

## 2016-01-13 MED ORDER — METFORMIN HCL 500 MG PO TABS: 500.0000 mg | ORAL_TABLET | Freq: Two times a day (BID) | ORAL | Status: DC

## 2016-01-13 NOTE — Telephone Encounter (Signed)
Refill for metformin sent into pharmacy. Thanks!  

## 2016-01-14 ENCOUNTER — Emergency Department (HOSPITAL_COMMUNITY): Payer: Medicaid Other

## 2016-01-14 ENCOUNTER — Encounter (HOSPITAL_COMMUNITY): Payer: Self-pay

## 2016-01-14 ENCOUNTER — Emergency Department (HOSPITAL_COMMUNITY)
Admission: EM | Admit: 2016-01-14 | Discharge: 2016-01-14 | Disposition: A | Discharge status: Home / Self Care | Payer: Medicaid Other | Attending: Emergency Medicine | Admitting: Emergency Medicine

## 2016-01-14 DIAGNOSIS — Z7984 Long term (current) use of oral hypoglycemic drugs: Secondary | ICD-10-CM | POA: Insufficient documentation

## 2016-01-14 DIAGNOSIS — Z79899 Other long term (current) drug therapy: Secondary | ICD-10-CM | POA: Insufficient documentation

## 2016-01-14 DIAGNOSIS — F17210 Nicotine dependence, cigarettes, uncomplicated: Secondary | ICD-10-CM | POA: Diagnosis not present

## 2016-01-14 DIAGNOSIS — R51 Headache: Secondary | ICD-10-CM | POA: Insufficient documentation

## 2016-01-14 DIAGNOSIS — R200 Anesthesia of skin: Secondary | ICD-10-CM | POA: Diagnosis not present

## 2016-01-14 DIAGNOSIS — E119 Type 2 diabetes mellitus without complications: Secondary | ICD-10-CM | POA: Diagnosis not present

## 2016-01-14 DIAGNOSIS — E7800 Pure hypercholesterolemia, unspecified: Secondary | ICD-10-CM | POA: Diagnosis not present

## 2016-01-14 DIAGNOSIS — R0789 Other chest pain: Secondary | ICD-10-CM | POA: Diagnosis not present

## 2016-01-14 DIAGNOSIS — R079 Chest pain, unspecified: Secondary | ICD-10-CM

## 2016-01-14 LAB — I-STAT TROPONIN, ED
TROPONIN I, POC: 0 ng/mL (ref 0.00–0.08)
Troponin i, poc: 0 ng/mL (ref 0.00–0.08)

## 2016-01-14 LAB — CBC
HCT: 43.9 % (ref 36.0–46.0)
Hemoglobin: 15.3 g/dL — ABNORMAL HIGH (ref 12.0–15.0)
MCH: 28.5 pg (ref 26.0–34.0)
MCHC: 34.9 g/dL (ref 30.0–36.0)
MCV: 81.9 fL (ref 78.0–100.0)
PLATELETS: 321 10*3/uL (ref 150–400)
RBC: 5.36 MIL/uL — AB (ref 3.87–5.11)
RDW: 13.2 % (ref 11.5–15.5)
WBC: 8.6 10*3/uL (ref 4.0–10.5)

## 2016-01-14 LAB — BASIC METABOLIC PANEL
Anion gap: 10 (ref 5–15)
BUN: 13 mg/dL (ref 6–20)
CALCIUM: 9.5 mg/dL (ref 8.9–10.3)
CO2: 23 mmol/L (ref 22–32)
CREATININE: 0.79 mg/dL (ref 0.44–1.00)
Chloride: 103 mmol/L (ref 101–111)
GFR calc non Af Amer: >60 mL/min (ref >60)
Glucose, Bld: 111 mg/dL — ABNORMAL HIGH (ref 65–99)
Potassium: 3.5 mmol/L (ref 3.5–5.1)
SODIUM: 136 mmol/L (ref 135–145)

## 2016-01-14 LAB — TROPONIN I

## 2016-01-14 LAB — D-DIMER, QUANTITATIVE: D-Dimer, Quant: 0.27 ug/mL-FEU (ref 0.00–0.50)

## 2016-01-14 NOTE — ED Notes (Signed)
Called lab able to add D-dimer to available blood.

## 2016-01-14 NOTE — ED Provider Notes (Signed)
CSN: [id number]     Arrival date & time 01/14/16  0930 History   First MD Initiated Contact with Patient 01/14/16 [phone]     Chief Complaint  Patient presents with  . Chest Pain     (Consider location/radiation/quality/duration/timing/severity/associated sxs/prior Treatment) HPI Comments: Jessica Lawson is a 50 y.o. female with history of prediabetes and high cholesterol presents to ED with chest pain. Chest pain started approximately one hour ago. Pain is centrally located with radiation into back, 6/10, aching in nature, worse with movement and deep inspiration. Associated symptoms include shortness of breath, diaphoresis, generalized weakness, lightheadedness. Reports mild headache, right-sided weakness, neck pain, and numbness in hands bilaterally. Denies fever, chills. No cough, palpitations, leg swelling. No abdominal complaints. No urinary complaints. Denies cardiac history. Denies recent trauma to chest wall.  Patient is a 50 y.o. female presenting with chest pain. The history is provided by the patient and medical records.  Chest Pain Associated symptoms: headache ( mild.), numbness ( b/l hands), shortness of breath and weakness (generalized)   Associated symptoms: no abdominal pain, no cough, no diaphoresis, no dizziness, no dysphagia, no fever, no nausea, no palpitations and not vomiting     Past Medical History  Diagnosis Date  . UTI (lower urinary tract infection)   . Diabetes mellitus without complication ([place])   . High blood cholesterol    Past Surgical History  Procedure Laterality Date  . Cesarean section     Family History  Problem Relation Age of Onset  . Hyperlipidemia Other   . Hypertension Other   . Diabetes Other    Social History  Substance Use Topics  . Smoking status: Current Every Day Smoker -- 1.00 packs/day    Types: Cigarettes  . Smokeless tobacco: None  . Alcohol Use: No   OB History    No data available     Review of Systems  Constitutional:  Negative for fever, chills and diaphoresis.  HENT: Negative for sore throat and trouble swallowing.   Eyes: Negative for visual disturbance.  Respiratory: Positive for shortness of breath. Negative for cough.   Cardiovascular: Positive for chest pain. Negative for palpitations and leg swelling.  Gastrointestinal: Positive for diarrhea. Negative for nausea, vomiting, abdominal pain, constipation and blood in stool.  Genitourinary: Negative for dysuria and hematuria.  Musculoskeletal: Negative for neck pain and neck stiffness.  Allergic/Immunologic: Positive for environmental allergies.  Neurological: Positive for weakness (generalized), light-headedness, numbness ( b/l hands) and headaches ( mild.). Negative for dizziness and syncope.      Allergies  Asa; Iohexol; Penicillins; Statins; and Sulfur  Home Medications   Prior to Admission medications   Medication Sig Start Date End Date Taking? Authorizing Provider  acetaminophen-codeine (TYLENOL #3) 300-30 MG tablet Take 1 tablet by mouth every 8 (eight) hours as needed for moderate pain. 09/03/15   Dorena Dew, FNP  cetirizine (ZYRTEC) 10 MG chewable tablet Chew 1 tablet (10 mg total) by mouth daily. 10/26/15   Dorena Dew, FNP  fenofibrate 160 MG tablet Take 1 tablet (160 mg total) by mouth daily. 01/12/16   Dorena Dew, FNP  metFORMIN (GLUCOPHAGE) 500 MG tablet Take 1 tablet (500 mg total) by mouth 2 (two) times daily with a meal. Reported on 09/03/2015 01/13/16   Dorena Dew, FNP  Multiple Vitamin (MULTIVITAMIN WITH MINERALS) TABS tablet Take 1 tablet by mouth daily.    Historical Provider, MD  omega-3 acid ethyl esters (LOVAZA) 1 g capsule Take 2 capsules (2 g  total) by mouth 2 (two) times daily. 01/07/16   Dorena Dew, FNP   BP 113/87 mmHg  Pulse 71  Temp(Src) 97.8 F (36.6 C) (Oral)  Resp 16  Ht 4\' 11"  (1.499 m)  Wt 78.019 kg  BMI 34.72 kg/m2  SpO2 98%  LMP 08/22/2011 Physical Exam  Constitutional: She  appears well-developed and well-nourished. No distress.  HENT:  Head: Normocephalic and atraumatic.  Mouth/Throat: Oropharynx is clear and moist. No oropharyngeal exudate.  Eyes: Conjunctivae and EOM are normal. Pupils are equal, round, and reactive to light. Right eye exhibits no discharge. Left eye exhibits no discharge. No scleral icterus.  Neck: Normal range of motion. Neck supple.  Cardiovascular: Normal rate, regular rhythm, normal heart sounds and intact distal pulses.   No murmur heard. Pulmonary/Chest: Effort normal and breath sounds normal. No respiratory distress.  TTP of chest wall.   Abdominal: Soft. Bowel sounds are normal. There is no tenderness. There is no rebound and no guarding.  Musculoskeletal: Normal range of motion.  Lymphadenopathy:    She has no cervical adenopathy.  Neurological: She is alert. She displays a negative Romberg sign. Coordination normal.  Mental Status:  Alert, thought content appropriate, able to give a coherent history. Speech fluent without evidence of aphasia. Able to follow 2 step commands without difficulty.  Cranial Nerves:  II:  Peripheral visual fields grossly normal, pupils equal, round, reactive to light III,IV, VI: ptosis not present, extra-ocular motions intact bilaterally  V,VII: smile symmetric, facial light touch sensation equal VIII: hearing grossly normal to voice  X: uvula elevates symmetrically  XI: bilateral shoulder shrug symmetric and strong XII: midline tongue extension without fassiculations Motor:  Normal tone. 5/5 in upper and lower extremities bilaterally including strong and equal grip strength and dorsiflexion/plantar flexion Sensory: sensation grossly intact  Cerebellar: normal finger-to-nose with bilateral upper extremities CV: distal pulses palpable throughout   Skin: Skin is warm and dry. She is not diaphoretic.  Psychiatric: She has a normal mood and affect. Her behavior is normal.    ED Course  Procedures  (including critical care time) Labs Review Labs Reviewed  BASIC METABOLIC PANEL - Abnormal; Notable for the following:    Glucose, Bld 111 (*)    All other components within normal limits  CBC - Abnormal; Notable for the following:    RBC 5.36 (*)    Hemoglobin 15.3 (*)    All other components within normal limits  D-DIMER, QUANTITATIVE (NOT AT [facility])  TROPONIN I  Randolm Idol, ED  Randolm Idol, ED    Imaging Review Dg Chest 2 View  01/14/2016  CLINICAL DATA:  Patient with centralized chest pain radiating to the back. Shortness of breath. EXAM: CHEST  2 VIEW COMPARISON:  Chest radiograph 08/19/2006. FINDINGS: Normal cardiac and mediastinal contours. No consolidative pulmonary opacities. No pleural effusion or pneumothorax. Mid thoracic spine degenerative changes. IMPRESSION: No active cardiopulmonary disease. Electronically Signed   By: Lovey Newcomer M.D.   On: 01/14/2016 10:09   I have personally reviewed and evaluated these images and lab results as part of my medical decision-making.   EKG Interpretation   Date/Time:  Thursday January 14 2016 09:34:26 EDT Ventricular Rate:  77 PR Interval:  134 QRS Duration: 84 QT Interval:  396 QTC Calculation: 448 R Axis:   68 Text Interpretation:  Normal sinus rhythm Normal ECG since last tracing no  significant change Confirmed by BELFI  MD, MELANIE ([id number]) on 01/14/2016  11:24:24 AM  MDM   Final diagnoses:  Chest pain, unspecified chest pain type    Patient is Afebrile and nontoxic. Initial presentation mildly hypertensive, vital signs otherwise stable. Physical exam remarkable for TTP of chest wall. Patient politely declined pain medication at this time. EKG shows normal sinus rhythm with no significant changes. Initial troponin negative. Heart score 3. Chest x-ray negative for pneumonia, pleural effusion, or pneumothorax. CBC remarkable for mildly elevated hemoglobin. BMP unremarkable. Well's score 0. D-dimer negative.    With history of pain worse with deep inspiration/movement and TTP of chest wall suspect musculoskeletal vs pleurisy. Will delta trop, if negative will discharge home with follow-up with primary care.  Repeat troponin negative. Discussed results and plan with patient. Discussed symptomatic treatment with Tylenol or ibuprofen. Recommended follow up with PCP in next 3 days. Discussed return precautions. She voiced understanding is agreeable.    Roxanna Mew, [place] 01/14/16 2154  Malvin Johns, MD 01/19/16 [phone]

## 2016-01-14 NOTE — Discharge Instructions (Signed)
Read the information below.   Your EKG was normal. Your troponin (cardiac enzyme) was normal. Your chest x-ray was normal. Your WBC was normal. Your electrolytes were normal. Your marker for blood clots was normal.  You can take ibuprofen or tylenol for relief of pain.  Be sure to call and schedule a follow up with your PCP for re-evaluation in next three days.  You may return to the Emergency Department at any time for worsening condition or any new symptoms that concern you. Return to ED if you develop shortness of breath, chest pain, fever, neurologic symptoms, or loss of consciousness.    Nonspecific Chest Pain It is often hard to find the cause of chest pain. There is always a chance that your pain could be related to something serious, such as a heart attack or a blood clot in your lungs. Chest pain can also be caused by conditions that are not life-threatening. If you have chest pain, it is very important to follow up with your doctor.  HOME CARE  If you were prescribed an antibiotic medicine, finish it all even if you start to feel better.  Avoid any activities that cause chest pain.  Do not use any tobacco products, including cigarettes, chewing tobacco, or electronic cigarettes. If you need help quitting, ask your doctor.  Do not drink alcohol.  Take medicines only as told by your doctor.  Keep all follow-up visits as told by your doctor. This is important. This includes any further testing if your chest pain does not go away.  Your doctor may tell you to keep your head raised (elevated) while you sleep.  Make lifestyle changes as told by your doctor. These may include:  Getting regular exercise. Ask your doctor to suggest some activities that are safe for you.  Eating a heart-healthy diet. Your doctor or a diet specialist (dietitian) can help you to learn healthy eating options.  Maintaining a healthy weight.  Managing diabetes, if necessary.  Reducing stress. GET HELP  IF:  Your chest pain does not go away, even after treatment.  You have a rash with blisters on your chest.  You have a fever. GET HELP RIGHT AWAY IF:  Your chest pain is worse.  You have an increasing cough, or you cough up blood.  You have severe belly (abdominal) pain.  You feel extremely weak.  You pass out (faint).  You have chills.  You have sudden, unexplained chest discomfort.  You have sudden, unexplained discomfort in your arms, back, neck, or jaw.  You have shortness of breath at any time.  You suddenly start to sweat, or your skin gets clammy.  You feel nauseous.  You vomit.  You suddenly feel light-headed or dizzy.  Your heart begins to beat quickly, or it feels like it is skipping beats. These symptoms may be an emergency. Do not wait to see if the symptoms will go away. Get medical help right away. Call your local emergency services (911 in the [place] Do not drive yourself to the hospital.   This information is not intended to replace advice given to you by your health care provider. Make sure you discuss any questions you have with your health care provider.   Document Released: 01/18/2008 Document Revised: 08/22/2014 Document Reviewed: 03/07/2014 Elsevier Interactive Patient Education [employer].

## 2016-01-14 NOTE — ED Notes (Signed)
Patient here with 1 hour of central chest pain with radiation to back and shortness of breath. Pain worse with movement and inspiration.

## 2016-02-13 DIAGNOSIS — IMO0001 Reserved for inherently not codable concepts without codable children: Secondary | ICD-10-CM

## 2016-02-13 HISTORY — DX: Reserved for inherently not codable concepts without codable children: IMO0001

## 2016-03-05 ENCOUNTER — Emergency Department (HOSPITAL_COMMUNITY): Payer: Medicaid Other

## 2016-03-05 ENCOUNTER — Observation Stay (HOSPITAL_COMMUNITY)
Admission: EM | Admit: 2016-03-05 | Discharge: 2016-03-06 | Disposition: A | Discharge status: Home / Self Care | Payer: Medicaid Other | Attending: Internal Medicine | Admitting: Internal Medicine

## 2016-03-05 ENCOUNTER — Encounter (HOSPITAL_COMMUNITY): Payer: Self-pay | Admitting: Emergency Medicine

## 2016-03-05 ENCOUNTER — Observation Stay (HOSPITAL_BASED_OUTPATIENT_CLINIC_OR_DEPARTMENT_OTHER): Payer: Medicaid Other

## 2016-03-05 DIAGNOSIS — Z886 Allergy status to analgesic agent status: Secondary | ICD-10-CM | POA: Insufficient documentation

## 2016-03-05 DIAGNOSIS — Z6833 Body mass index (BMI) 33.0-33.9, adult: Secondary | ICD-10-CM | POA: Diagnosis not present

## 2016-03-05 DIAGNOSIS — J209 Acute bronchitis, unspecified: Secondary | ICD-10-CM

## 2016-03-05 DIAGNOSIS — R079 Chest pain, unspecified: Secondary | ICD-10-CM

## 2016-03-05 DIAGNOSIS — E119 Type 2 diabetes mellitus without complications: Secondary | ICD-10-CM

## 2016-03-05 DIAGNOSIS — E781 Pure hyperglyceridemia: Secondary | ICD-10-CM | POA: Diagnosis not present

## 2016-03-05 DIAGNOSIS — R9431 Abnormal electrocardiogram [ECG] [EKG]: Secondary | ICD-10-CM | POA: Insufficient documentation

## 2016-03-05 DIAGNOSIS — E669 Obesity, unspecified: Secondary | ICD-10-CM | POA: Diagnosis not present

## 2016-03-05 DIAGNOSIS — Z880 Allergy status to penicillin: Secondary | ICD-10-CM | POA: Insufficient documentation

## 2016-03-05 DIAGNOSIS — Z9114 Patient's other noncompliance with medication regimen: Secondary | ICD-10-CM | POA: Insufficient documentation

## 2016-03-05 DIAGNOSIS — I088 Other rheumatic multiple valve diseases: Secondary | ICD-10-CM | POA: Insufficient documentation

## 2016-03-05 DIAGNOSIS — Z888 Allergy status to other drugs, medicaments and biological substances status: Secondary | ICD-10-CM | POA: Diagnosis not present

## 2016-03-05 DIAGNOSIS — R531 Weakness: Secondary | ICD-10-CM

## 2016-03-05 DIAGNOSIS — Z79899 Other long term (current) drug therapy: Secondary | ICD-10-CM | POA: Diagnosis not present

## 2016-03-05 DIAGNOSIS — F17210 Nicotine dependence, cigarettes, uncomplicated: Secondary | ICD-10-CM | POA: Insufficient documentation

## 2016-03-05 DIAGNOSIS — Z7984 Long term (current) use of oral hypoglycemic drugs: Secondary | ICD-10-CM | POA: Insufficient documentation

## 2016-03-05 DIAGNOSIS — E785 Hyperlipidemia, unspecified: Secondary | ICD-10-CM | POA: Diagnosis present

## 2016-03-05 DIAGNOSIS — E7800 Pure hypercholesterolemia, unspecified: Secondary | ICD-10-CM | POA: Insufficient documentation

## 2016-03-05 DIAGNOSIS — E782 Mixed hyperlipidemia: Secondary | ICD-10-CM | POA: Diagnosis present

## 2016-03-05 DIAGNOSIS — Z882 Allergy status to sulfonamides status: Secondary | ICD-10-CM | POA: Diagnosis not present

## 2016-03-05 DIAGNOSIS — Z720 Tobacco use: Secondary | ICD-10-CM | POA: Diagnosis present

## 2016-03-05 HISTORY — DX: Obesity, unspecified: E66.9

## 2016-03-05 HISTORY — DX: Tobacco use: Z72.0

## 2016-03-05 LAB — LIPID PANEL
CHOL/HDL RATIO: 10.8 ratio
CHOLESTEROL: 323 mg/dL — AB (ref 0–200)
HDL: 30 mg/dL — AB (ref >40)
LDL CALC: 220 mg/dL — AB (ref 0–99)
TRIGLYCERIDES: 363 mg/dL — AB (ref <150)
VLDL: 73 mg/dL — AB (ref 0–40)

## 2016-03-05 LAB — URINE MICROSCOPIC-ADD ON
Bacteria, UA: NONE SEEN
RBC / HPF: NONE SEEN RBC/hpf (ref 0–5)
WBC UA: NONE SEEN WBC/hpf (ref 0–5)

## 2016-03-05 LAB — CBC
HEMATOCRIT: 44.8 % (ref 36.0–46.0)
HEMOGLOBIN: 15.2 g/dL — AB (ref 12.0–15.0)
MCH: 28.3 pg (ref 26.0–34.0)
MCHC: 33.9 g/dL (ref 30.0–36.0)
MCV: 83.4 fL (ref 78.0–100.0)
Platelets: 337 10*3/uL (ref 150–400)
RBC: 5.37 MIL/uL — ABNORMAL HIGH (ref 3.87–5.11)
RDW: 13.5 % (ref 11.5–15.5)
WBC: 11.9 10*3/uL — ABNORMAL HIGH (ref 4.0–10.5)

## 2016-03-05 LAB — GLUCOSE, CAPILLARY
GLUCOSE-CAPILLARY: 123 mg/dL — AB (ref 65–99)
Glucose-Capillary: 120 mg/dL — ABNORMAL HIGH (ref 65–99)
Glucose-Capillary: 121 mg/dL — ABNORMAL HIGH (ref 65–99)

## 2016-03-05 LAB — URINALYSIS, ROUTINE W REFLEX MICROSCOPIC
BILIRUBIN URINE: NEGATIVE
GLUCOSE, UA: NEGATIVE mg/dL
KETONES UR: NEGATIVE mg/dL
Leukocytes, UA: NEGATIVE
NITRITE: NEGATIVE
PH: 5.5 (ref 5.0–8.0)
Protein, ur: NEGATIVE mg/dL
Specific Gravity, Urine: 1.012 (ref 1.005–1.030)

## 2016-03-05 LAB — BASIC METABOLIC PANEL
Anion gap: 9 (ref 5–15)
BUN: 13 mg/dL (ref 6–20)
CHLORIDE: 105 mmol/L (ref 101–111)
CO2: 25 mmol/L (ref 22–32)
Calcium: 9.6 mg/dL (ref 8.9–10.3)
Creatinine, Ser: 0.79 mg/dL (ref 0.44–1.00)
GFR calc Af Amer: >60 mL/min (ref >60)
GFR calc non Af Amer: >60 mL/min (ref >60)
GLUCOSE: 113 mg/dL — AB (ref 65–99)
Potassium: 3.7 mmol/L (ref 3.5–5.1)
SODIUM: 139 mmol/L (ref 135–145)

## 2016-03-05 LAB — TROPONIN I
Troponin I: <0.03 ng/mL (ref <0.03)
Troponin I: <0.03 ng/mL (ref <0.03)
Troponin I: <0.03 ng/mL (ref <0.03)

## 2016-03-05 LAB — I-STAT TROPONIN, ED: TROPONIN I, POC: 0 ng/mL (ref 0.00–0.08)

## 2016-03-05 LAB — ECHOCARDIOGRAM COMPLETE
Height: 59 in
Weight: 2651.2 oz

## 2016-03-05 MED ORDER — GI COCKTAIL ~~LOC~~
30.0000 mL | Freq: Once | ORAL | Status: AC
  Administered 2016-03-05: 30 mL via ORAL
  Filled 2016-03-05: qty 30

## 2016-03-05 MED ORDER — GI COCKTAIL ~~LOC~~: 30.0000 mL | Freq: Four times a day (QID) | ORAL | Status: DC | PRN

## 2016-03-05 MED ORDER — NITROGLYCERIN 2 % TD OINT
0.5000 [in_us] | TOPICAL_OINTMENT | Freq: Three times a day (TID) | TRANSDERMAL | Status: DC
  Filled 2016-03-05: qty 30

## 2016-03-05 MED ORDER — LORATADINE 10 MG PO TABS
10.0000 mg | ORAL_TABLET | Freq: Every day | ORAL | Status: DC
  Administered 2016-03-05 – 2016-03-06 (×2): 10 mg via ORAL
  Filled 2016-03-05 (×2): qty 1

## 2016-03-05 MED ORDER — MORPHINE SULFATE (PF) 4 MG/ML IV SOLN
4.0000 mg | Freq: Once | INTRAVENOUS | Status: DC
  Filled 2016-03-05: qty 1

## 2016-03-05 MED ORDER — NICOTINE 14 MG/24HR TD PT24
14.0000 mg | MEDICATED_PATCH | Freq: Every day | TRANSDERMAL | Status: DC
  Administered 2016-03-05 – 2016-03-06 (×2): 14 mg via TRANSDERMAL
  Filled 2016-03-05 (×2): qty 1

## 2016-03-05 MED ORDER — ACETAMINOPHEN 325 MG PO TABS: 325.0000 mg | ORAL_TABLET | Freq: Four times a day (QID) | ORAL | Status: DC | PRN

## 2016-03-05 MED ORDER — ONDANSETRON HCL 4 MG/2ML IJ SOLN: 4.0000 mg | Freq: Four times a day (QID) | INTRAMUSCULAR | Status: DC | PRN

## 2016-03-05 MED ORDER — ACETAMINOPHEN 325 MG PO TABS: 650.0000 mg | ORAL_TABLET | ORAL | Status: DC | PRN

## 2016-03-05 MED ORDER — MORPHINE SULFATE (PF) 2 MG/ML IV SOLN: 2.0000 mg | INTRAVENOUS | Status: DC | PRN

## 2016-03-05 MED ORDER — ALBUTEROL SULFATE (2.5 MG/3ML) 0.083% IN NEBU
2.5000 mg | INHALATION_SOLUTION | Freq: Four times a day (QID) | RESPIRATORY_TRACT | Status: AC
  Administered 2016-03-05: 2.5 mg via RESPIRATORY_TRACT
  Filled 2016-03-05: qty 3

## 2016-03-05 NOTE — ED Provider Notes (Signed)
CSN: [id number]     Arrival date & time 03/05/16  0050 History   First MD Initiated Contact with Patient 03/05/16 0329     Chief Complaint  Patient presents with  . Chest Pain     (Consider location/radiation/quality/duration/timing/severity/associated sxs/prior Treatment) HPI Comments: Patient with PMH of DM, HL presents to the ED with a chief complaint of chest pain. She states that the pain started while she was lying in bed tonight.  Describes the pain as a heaviness/pressure on her central chest.  She reports some associated SOB and nausea.  Denies any vomiting, radiating pain, or diaphoresis.  She states that the pain has improved, but is still present.  It has been constant since its onset several hours ago.  She has not taken anything for her symptoms.  She is allergic to aspirin.  She was seen for similar 2 months ago.  She is an everyday smoker.  The history is provided by the patient. No language interpreter was used.    Past Medical History  Diagnosis Date  . UTI (lower urinary tract infection)   . Diabetes mellitus without complication ([place])   . High blood cholesterol    Past Surgical History  Procedure Laterality Date  . Cesarean section     Family History  Problem Relation Age of Onset  . Hyperlipidemia Other   . Hypertension Other   . Diabetes Other    Social History  Substance Use Topics  . Smoking status: Current Every Day Smoker -- 0.00 packs/day    Types: Cigarettes  . Smokeless tobacco: None  . Alcohol Use: No   OB History    No data available     Review of Systems  Respiratory: Positive for shortness of breath.   Cardiovascular: Positive for chest pain.  All other systems reviewed and are negative.     Allergies  Asa; Iohexol; Penicillins; Statins; and Sulfur  Home Medications   Prior to Admission medications   Medication Sig Start Date End Date Taking? Authorizing Provider  acetaminophen-codeine (TYLENOL #3) 300-30 MG tablet Take 1  tablet by mouth every 8 (eight) hours as needed for moderate pain. 09/03/15   Dorena Dew, FNP  cetirizine (ZYRTEC) 10 MG chewable tablet Chew 1 tablet (10 mg total) by mouth daily. 10/26/15   Dorena Dew, FNP  fenofibrate 160 MG tablet Take 1 tablet (160 mg total) by mouth daily. 01/12/16   Dorena Dew, FNP  metFORMIN (GLUCOPHAGE) 500 MG tablet Take 1 tablet (500 mg total) by mouth 2 (two) times daily with a meal. Reported on 09/03/2015 01/13/16   Dorena Dew, FNP  Multiple Vitamin (MULTIVITAMIN WITH MINERALS) TABS tablet Take 1 tablet by mouth daily.    Historical Provider, MD  omega-3 acid ethyl esters (LOVAZA) 1 g capsule Take 2 capsules (2 g total) by mouth 2 (two) times daily. 01/07/16   Dorena Dew, FNP   BP 156/90 mmHg  Pulse 80  Temp(Src) 97.6 F (36.4 C) (Oral)  Resp 18  SpO2 99%  LMP 08/22/2011 Physical Exam  Constitutional: She is oriented to person, place, and time. She appears well-developed and well-nourished.  HENT:  Head: Normocephalic and atraumatic.  Eyes: Conjunctivae and EOM are normal. Pupils are equal, round, and reactive to light.  Neck: Normal range of motion. Neck supple.  Cardiovascular: Normal rate and regular rhythm.  Exam reveals no gallop and no friction rub.   No murmur heard. Pulmonary/Chest: Effort normal and breath sounds normal. No respiratory  distress. She has no wheezes. She has no rales. She exhibits no tenderness.  Abdominal: Soft. Bowel sounds are normal. She exhibits no distension and no mass. There is no tenderness. There is no rebound and no guarding.  Musculoskeletal: Normal range of motion. She exhibits no edema or tenderness.  Neurological: She is alert and oriented to person, place, and time.  Skin: Skin is warm and dry.  Psychiatric: She has a normal mood and affect. Her behavior is normal. Judgment and thought content normal.  Nursing note and vitals reviewed.   ED Course  Procedures (including critical care  time) Labs Review Labs Reviewed  BASIC METABOLIC PANEL - Abnormal; Notable for the following:    Glucose, Bld 113 (*)    All other components within normal limits  CBC - Abnormal; Notable for the following:    WBC 11.9 (*)    RBC 5.37 (*)    Hemoglobin 15.2 (*)    All other components within normal limits  I-STAT TROPOININ, ED    Imaging Review Dg Chest 2 View  03/05/2016  CLINICAL DATA:  Chest pain and pressure tonight, shortness of breath. History of diabetes, smoker. EXAM: CHEST  2 VIEW COMPARISON:  Chest radiograph January 14, 2016 FINDINGS: Cardiomediastinal silhouette is normal. Mild bronchitic changes. The lungs are clear without pleural effusions or focal consolidations. Trachea projects midline and there is no pneumothorax. Soft tissue planes and included osseous structures are non-suspicious. IMPRESSION: Mild bronchitic changes without focal consolidation. Electronically Signed   By: Elon Alas M.D.   On: 03/05/2016 01:51   I have personally reviewed and evaluated these images and lab results as part of my medical decision-making.   EKG Interpretation   Date/Time:  Saturday March 05 2016 00:55:31 EDT Ventricular Rate:  78 PR Interval:  152 QRS Duration: 82 QT Interval:  392 QTC Calculation: 446 R Axis:   61 Text Interpretation:  Normal sinus rhythm with sinus arrhythmia  Nonspecific T wave abnormality Abnormal ECG No significant change since  last tracing Confirmed by WARD,  DO, KRISTEN [id number]) on 03/05/2016 3:22:24  AM      MDM   Final diagnoses:  Chest pain, unspecified chest pain type    Patient with CP and SOB.  Onset tonight, patient at rest.  No exertional component.  Cardiac risk factors include HTN, DM, smoking, and age.  HEART score is 4-5.  Doubt PE, had recent negative D-dimer.  Patient discussed with Dr. Leonides Schanz, who agrees with plan for observation admission for chest pain rule out.  5:22 AM Appreciate Dr. Tamala Julian for observation admission.  Requests  that I add UA.  Montine Circle, PA-C 03/05/16 [place], DO 03/05/16 [id number]

## 2016-03-05 NOTE — ED Notes (Signed)
Pt. reports intermittent central chest pain with nausea and SOB onset this evening .

## 2016-03-05 NOTE — Progress Notes (Signed)
Pt arrived from ED around 0630.  MD notified of arrival, day shift MD to come to floor to assess pt.  Pt placed on monitor, oriented to room and explained that a urine specimen is still needed.  Pt is a smoker, IS was given and explained. No c/o pain or SOB, VSS.  Will report off to oncoming RN. Fara Olden P

## 2016-03-05 NOTE — Progress Notes (Signed)
Received report from Montine Circle PA-C Ms. Dest is a 50 year old female with a pmh  of HTN, DM, and tobacco abuse who presents with complaints of dull centralized chest pressure/heaviness  last night while laying in bed. No vomiting, diaphoresis, or radiation of pain. Reports allergy to aspirin. Lab work revealed  WBC 11.9,  hemoglobin 15.2, and troponins negative 2. Chest x-ray showing mild bronchitic changes without focal consolidation. EKG showing nonspecific T-wave abnormalities that appears similar to previous tracing. Heart score = 4. Trending cardiac troponin. Admitted to observation telemetry bed for chest pain rule out. Question if patient's symptoms could be 2/2 respiratory cause i.e bronchitis given patient's smoking history.

## 2016-03-05 NOTE — H&P (Signed)
History and Physical    Jessica Lawson [id number] DOB: August 26, 1965 DOA: 03/05/2016  PCP: Dorena Dew, FNP Patient coming from: home  Chief Complaint: chest pain  HPI: Jessica Lawson is a 50 y.o. female with medical history significant  For hyperlipidemia, prediabetes, smoking, obesity presents to the emergency department with the chief complaint of chest pain at rest.  Information is obtained from the patient. She states she was awakened out of her sleep with a chest "heaviness and pressure" located in her central chest. Associated symptoms include shortness of breath nausea. She denies any vomiting diaphoresis or radiating pain. She denies any lower extremity edema or orthopnea. She denies headache dizziness syncope or near-syncope. He states the pain lasted several hours she did not take aspirin due to an allergy. She states nothing made the pain better or worse. She reports being seen in the emergency department approximately 2 months ago for the same. She also reports she's not been taken her TriCor or her metformin as prescribed. She denies fever chills abdominal pain dysuria hematuria frequency or urgency. She denies any constipation diarrhea melena or bright red blood per rectum. She denies any sick contacts or recent travel.    ED Course: In the emergency department she's afebrile hemodynamically stable and not hypoxic.  Review of Systems: As per HPI otherwise 10 point review of systems negative.   Ambulatory Status: She ambulates independently with a steady gait  Past Medical History  Diagnosis Date  . UTI (lower urinary tract infection)   . Diabetes mellitus without complication ([place])   . High blood cholesterol   . Obesity   . Tobacco use     Past Surgical History  Procedure Laterality Date  . Cesarean section      Social History   Social History  . Marital Status: Single    Spouse Name: N/A  . Number of Children: N/A  . Years of Education: N/A    Occupational History  . Not on file.   Social History Main Topics  . Smoking status: Current Every Day Smoker -- 0.00 packs/day    Types: Cigarettes  . Smokeless tobacco: Not on file  . Alcohol Use: No  . Drug Use: No  . Sexual Activity: Not on file   Other Topics Concern  . Not on file   Social History Narrative   She lives at home with her family. 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 Asa [Aspirin] Anaphylaxis, Hives and Swelling  . Sulfur Anaphylaxis, Hives, Swelling and Other (See Comments)    thrush  . Iohexol      Desc: pt complains of difficulty swallowing and thickened tongue   . Penicillins Hives, Swelling and Other (See Comments)    Thrush   . Statins Hives, Swelling and Other (See Comments)    Thrush     Family History  Problem Relation Age of Onset  . Hyperlipidemia Other   . Hypertension Other   . Diabetes Other     Prior to Admission medications   Medication Sig Start Date End Date Taking? Authorizing Provider  acetaminophen (TYLENOL) 325 MG tablet Take 325 mg by mouth every 6 (six) hours as needed for mild pain.   Yes Historical Provider, MD  cetirizine (ZYRTEC) 10 MG chewable tablet Chew 1 tablet (10 mg total) by mouth daily. 10/26/15  Yes Dorena Dew, FNP  metFORMIN (GLUCOPHAGE) 500 MG tablet Take 1 tablet (500 mg total) by mouth  2 (two) times daily with a meal. Reported on 09/03/2015 01/13/16  Yes Dorena Dew, FNP  omega-3 acid ethyl esters (LOVAZA) 1 g capsule Take 2 capsules (2 g total) by mouth 2 (two) times daily. 01/07/16  Yes Dorena Dew, FNP    Physical Exam: Filed Vitals:   03/05/16 0415 03/05/16 0430 03/05/16 0515 03/05/16 0630  BP: 114/64 118/61 132/90 120/60  Pulse: 65 62 70 52  Temp:    97.8 F (36.6 C)  TempSrc:    Oral  Resp: 20 12 18 18   Height:    4\' 11"  (1.499 m)  Weight:    75.161 kg (165 lb 11.2 oz)  SpO2: 97% 99% 98% 100%      General:  Appears calm and comfortable Eyes:  PERRL, EOMI, normal lids, iris ENT:  grossly normal hearing, lips & tongue, Mucus membranes of her mouth are moist and pink Neck:  no LAD, masses or thyromegaly Cardiovascular:  RRR, no m/r/g. No LE edema. Pedal pulses are present and palpable Respiratory:  CTA bilaterally somewhat distant, no w/r/r. Normal respiratory effort. Abdomen:  soft, ntnd, positive bowel sounds throughout no guarding or rebounding Skin:  no rash or induration seen on limited exam Musculoskeletal:  grossly normal tone BUE/BLE, good ROM, no bony abnormality Psychiatric:  grossly normal mood and affect, speech fluent and appropriate, AOx3 Neurologic:  CN 2-12 grossly intact, moves all extremities in coordinated fashion, sensation intact. Speech clear facial symmetry moves all extremities  Labs on Admission: I have personally reviewed following labs and imaging studies  CBC:  Recent Labs Lab 03/05/16 0102  WBC 11.9*  HGB 15.2*  HCT 44.8  MCV 83.4  PLT [id number]   Basic Metabolic Panel:  Recent Labs Lab 03/05/16 0102  NA 139  K 3.7  CL 105  CO2 25  GLUCOSE 113*  BUN 13  CREATININE 0.79  CALCIUM 9.6   GFR: Estimated Creatinine Clearance: 74.4 mL/min (by C-G formula based on Cr of 0.79). Liver Function Tests: No results for input(s): AST, ALT, ALKPHOS, BILITOT, PROT, ALBUMIN in the last 168 hours. No results for input(s): LIPASE, AMYLASE in the last 168 hours. No results for input(s): AMMONIA in the last 168 hours. Coagulation Profile: No results for input(s): INR, PROTIME in the last 168 hours. Cardiac Enzymes:  Recent Labs Lab 03/05/16 0553  TROPONINI <0.03   BNP (last 3 results) No results for input(s): PROBNP in the last 8760 hours. HbA1C: No results for input(s): HGBA1C in the last 72 hours. CBG: No results for input(s): GLUCAP in the last 168 hours. Lipid Profile: No results for input(s): CHOL, HDL, LDLCALC, TRIG, CHOLHDL, LDLDIRECT  in the last 72 hours. Thyroid Function Tests: No results for input(s): TSH, T4TOTAL, FREET4, T3FREE, THYROIDAB in the last 72 hours. Anemia Panel: No results for input(s): VITAMINB12, FOLATE, FERRITIN, TIBC, IRON, RETICCTPCT in the last 72 hours. Urine analysis:    Component Value Date/Time   COLORURINE RED* 02/22/2014 0905   APPEARANCEUR TURBID* 02/22/2014 0905   LABSPEC >=1.030 09/03/2015 1210   PHURINE 5.5 09/03/2015 1210   GLUCOSEU NEGATIVE 09/03/2015 1210   HGBUR MODERATE* 09/03/2015 1210   BILIRUBINUR NEGATIVE 09/03/2015 1210   KETONESUR NEGATIVE 09/03/2015 1210   PROTEINUR NEGATIVE 09/03/2015 1210   UROBILINOGEN 0.2 09/03/2015 1210   NITRITE NEGATIVE 09/03/2015 1210   LEUKOCYTESUR NEGATIVE 09/03/2015 1210    Creatinine Clearance: Estimated Creatinine Clearance: 74.4 mL/min (by C-G formula based on Cr of 0.79).  Sepsis Labs: @LABRCNTIP (procalcitonin:4,lacticidven:4) )No results found for  this or any previous visit (from the past 240 hour(s)).   Radiological Exams on Admission: Dg Chest 2 View  03/05/2016  CLINICAL DATA:  Chest pain and pressure tonight, shortness of breath. History of diabetes, smoker. EXAM: CHEST  2 VIEW COMPARISON:  Chest radiograph January 14, 2016 FINDINGS: Cardiomediastinal silhouette is normal. Mild bronchitic changes. The lungs are clear without pleural effusions or focal consolidations. Trachea projects midline and there is no pneumothorax. Soft tissue planes and included osseous structures are non-suspicious. IMPRESSION: Mild bronchitic changes without focal consolidation. Electronically Signed   By: Elon Alas M.D.   On: 03/05/2016 01:51    EKG: Independently reviewed. Normal sinus rhythm with sinus arrhythmia Nonspecific T wave abnormality Abnormal ECG No significant change since last tracing  Assessment/Plan Principal Problem:   Chest pain Active Problems:   Hyperlipidemia   Obesity   Hypercholesterolemia with hypertriglyceridemia    Diabetes mellitus without complication ([facility])   Tobacco use   Acute bronchitis   #1. Chest pain. Heart score 4. Risk factors include tobacco use hypercholesterolemia with hypertriglyceridemia, prediabetes, obesity, tobacco use. Chest pain free at the time of admission. EKG without acute changes. Initial troponin negative. Chest x-ray with mild bronchitic changes without focal consolidation. Hemoglobin A1c 6.452 days ago.  Was seen 2 months ago for same. Verbalizes some noncompliance with home medications.  -Admit to telemetry -Cycle troponin -Serial EKG -Lipid panel -Intolerant to statin allergy to aspirin -nebulizers -echo -lexiscan  #2. Diabetes without complication. Patient verbalizes prediabetes. Chart review confirms. PCP note. Currently on metformin. Does verbalize some noncompliance due to finances. Hemoglobin A1c 6.4 52 days ago. Serum glucose 113 on admission -CBGs -monitor  #3. Bronchitis. Chest x-ray shows bronchitic changes. Patient is a smoker -nebulizer  #4. Hyperlipidemia/hypercholesterolemia with hypertriglyceridemia.  -lipid panel  #5. Obesity. BMI 33.5 -Nutritional consult  6. Tobacco use.  -Cessation counseling offered    DVT prophylaxis: scd  Code Status: full  Family Communication: son at bedside  Disposition Plan: home  Consults called: none  Admission status: obs    Dyanne Carrel M MD [facility]  If 7PM-7AM, please contact night-coverage [email or web address] Password [facility]  03/05/2016, 8:15 AM

## 2016-03-06 ENCOUNTER — Observation Stay (HOSPITAL_COMMUNITY): Payer: Medicaid Other

## 2016-03-06 ENCOUNTER — Observation Stay (HOSPITAL_BASED_OUTPATIENT_CLINIC_OR_DEPARTMENT_OTHER): Payer: Medicaid Other

## 2016-03-06 DIAGNOSIS — R0789 Other chest pain: Secondary | ICD-10-CM

## 2016-03-06 DIAGNOSIS — E119 Type 2 diabetes mellitus without complications: Secondary | ICD-10-CM | POA: Diagnosis not present

## 2016-03-06 DIAGNOSIS — R079 Chest pain, unspecified: Secondary | ICD-10-CM

## 2016-03-06 DIAGNOSIS — E782 Mixed hyperlipidemia: Secondary | ICD-10-CM

## 2016-03-06 LAB — NM MYOCAR MULTI W/SPECT W/WALL MOTION / EF
CHL CUP MPHR: 170 {beats}/min
CSEPED: 5 min
CSEPEW: 1 METS
CSEPPHR: 115 {beats}/min
Percent HR: 67 %
Rest HR: 60 {beats}/min

## 2016-03-06 LAB — GLUCOSE, CAPILLARY: Glucose-Capillary: 117 mg/dL — ABNORMAL HIGH (ref 65–99)

## 2016-03-06 MED ORDER — REGADENOSON 0.4 MG/5ML IV SOLN
INTRAVENOUS | Status: AC
  Filled 2016-03-06: qty 5

## 2016-03-06 MED ORDER — ROSUVASTATIN CALCIUM 10 MG PO TABS: 10.0000 mg | ORAL_TABLET | Freq: Every day | ORAL | 1 refills | Status: DC

## 2016-03-06 MED ORDER — REGADENOSON 0.4 MG/5ML IV SOLN
0.4000 mg | Freq: Once | INTRAVENOUS | Status: AC
  Administered 2016-03-06: 0.4 mg via INTRAVENOUS
  Filled 2016-03-06: qty 5

## 2016-03-06 MED ORDER — TECHNETIUM TC 99M TETROFOSMIN IV KIT
30.0000 | PACK | Freq: Once | INTRAVENOUS | Status: AC | PRN
  Administered 2016-03-06: 30 via INTRAVENOUS

## 2016-03-06 MED ORDER — TECHNETIUM TC 99M TETROFOSMIN IV KIT
10.0000 | PACK | Freq: Once | INTRAVENOUS | Status: AC | PRN
  Administered 2016-03-06: 10 via INTRAVENOUS

## 2016-03-06 NOTE — Progress Notes (Signed)
Pt has been NPO all night she denied pain throughout the night. [facility] has called and plan to pick pt up at 0800

## 2016-03-06 NOTE — Progress Notes (Signed)
reveiewed avs. No questions. Patient went home with family with no distress noted.

## 2016-03-06 NOTE — Discharge Summary (Signed)
Physician Discharge Summary  [name] [id number] DOB: 1966/03/16 DOA: 03/05/2016  PCP: Dorena Dew, FNP  Admit date: 03/05/2016 Discharge date: 03/06/2016  Time spent: 35 minutes  Recommendations for Outpatient Follow-up:  1. She was monitored overnight for chest pain, having stress test that did not reveal reversible ischemia   Discharge Diagnoses:  Principal Problem:   Chest pain Active Problems:   Hyperlipidemia   Obesity   Hypercholesterolemia with hypertriglyceridemia   Diabetes mellitus without complication ([place])   Tobacco use   Acute bronchitis   Discharge Condition: Stable  Diet recommendation: Heart healthy  Filed Weights   03/05/16 0630  Weight: 75.2 kg (165 lb 11.2 oz)    History of present illness:  Jessica Lawson is a 50 y.o. female with medical history significant  For hyperlipidemia, prediabetes, smoking, obesity presents to the emergency department with the chief complaint of chest pain at rest.  Information is obtained from the patient. She states she was awakened out of her sleep with a chest "heaviness and pressure" located in her central chest. Associated symptoms include shortness of breath nausea. She denies any vomiting diaphoresis or radiating pain. She denies any lower extremity edema or orthopnea. She denies headache dizziness syncope or near-syncope. He states the pain lasted several hours she did not take aspirin due to an allergy. She states nothing made the pain better or worse. She reports being seen in the emergency department approximately 2 months ago for the same. She also reports she's not been taken her TriCor or her metformin as prescribed. She denies fever chills abdominal pain dysuria hematuria frequency or urgency. She denies any constipation diarrhea melena or bright red blood per rectum. She denies any sick contacts or recent travel.  Hospital Course:  [name] is a pleasant 50 year old female with a past medical  history of diabetes mellitus and dyslipidemia, presenting to the emergency department on 03/05/2016 with complaints of chest pain having atypical features. She was placed in overnight observation with continuous cardiac monitoring. EKG did not reveal acute ischemic changes with serial troponins remain negative. On the following morning she underwent stress test that did not show reversible ischemia. Transthoracic echocardiogram revealed preserved EF of 60-65% without wall motion abnormalities. Chest pain symptoms resolved. I suspect chest pain could've had a musculoskeletal origin. With regard to dyslipidemia fasting lipid panel showing LDL of 220 with triglycerides of 363. She was agreeable to trial of low-dose Crestor at 10 mg by mouth daily. Please follow. She was discharged home in stable condition on 03/06/2016.  Procedures:  Nuclear stress test impression: 1. No reversible ischemia or infarction. 2. Normal left ventricular wall motion. 3. Left ventricular ejection fraction 62% 4. Non invasive risk stratification*: Low   Transthoracic echocardiogram impression: - Left ventricle: The cavity size was normal. Wall thickness was   normal. Systolic function was normal. The estimated ejection   fraction was in the range of 60% to 65%. Wall motion was normal;   there were no regional wall motion abnormalities. Left   ventricular diastolic function parameters were normal. - Mitral valve: There was trivial regurgitation. - Right atrium: Central venous pressure (est): 3 mm Hg. - Tricuspid valve: There was mild regurgitation. - Pulmonary arteries: PA peak pressure: 38 mm Hg (S). - Pericardium, extracardiac: There was no pericardial effusion.   Discharge Exam: Vitals:   03/06/16 0958 03/06/16 1000  BP: 128/73 115/66  Pulse:    Resp: 20 20  Temp:      General: Currently chest pain-free, awake  alert Cardiovascular: Regular rate and rhythm normal S1-S2 Respiratory: Normal respiratory effort  lungs are clear Abdomen: Soft nontender nondistended Extremities: No edema  Discharge Instructions   Discharge Instructions    Call MD for:    Complete by:  As directed   Call MD for:  difficulty breathing, headache or visual disturbances    Complete by:  As directed   Call MD for:  extreme fatigue    Complete by:  As directed   Call MD for:  hives    Complete by:  As directed   Call MD for:  persistant dizziness or light-headedness    Complete by:  As directed   Call MD for:  persistant nausea and vomiting    Complete by:  As directed   Call MD for:  redness, tenderness, or signs of infection (pain, swelling, redness, odor or green/yellow discharge around incision site)    Complete by:  As directed   Call MD for:  severe uncontrolled pain    Complete by:  As directed   Call MD for:  temperature >100.4    Complete by:  As directed   Diet - low sodium heart healthy    Complete by:  As directed   Increase activity slowly    Complete by:  As directed     Current Discharge Medication List    START taking these medications   Details  rosuvastatin (CRESTOR) 10 MG tablet Take 1 tablet (10 mg total) by mouth daily. Qty: 30 tablet, Refills: 1      CONTINUE these medications which have NOT CHANGED   Details  acetaminophen (TYLENOL) 325 MG tablet Take 325 mg by mouth every 6 (six) hours as needed for mild pain.    cetirizine (ZYRTEC) 10 MG chewable tablet Chew 1 tablet (10 mg total) by mouth daily. Qty: 30 tablet, Refills: 5   Associated Diagnoses: Environmental allergies    metFORMIN (GLUCOPHAGE) 500 MG tablet Take 1 tablet (500 mg total) by mouth 2 (two) times daily with a meal. Reported on 09/03/2015 Qty: 60 tablet, Refills: 3   Associated Diagnoses: Prediabetes    Multiple Vitamins-Minerals (MULTIVITAMIN WITH MINERALS) tablet Take 1 tablet by mouth daily.    omega-3 acid ethyl esters (LOVAZA) 1 g capsule Take 2 capsules (2 g total) by mouth 2 (two) times daily. Qty: 60 capsule,  Refills: 11   Associated Diagnoses: Hypercholesterolemia with hypertriglyceridemia       Allergies  Allergen Reactions  . Asa [Aspirin] Anaphylaxis, Hives and Swelling  . Sulfur Anaphylaxis, Hives, Swelling and Other (See Comments)    thrush  . Iohexol      Desc: pt complains of difficulty swallowing and thickened tongue   . Penicillins Hives, Swelling and Other (See Comments)    Thrush   . Statins Hives, Swelling and Other (See Comments)    Thrush    Follow-up Information    Hollis,Lachina M, FNP Follow up in 1 week(s).   Specialty:  Family Medicine Contact information: [address] [place] [address] [phone]            The results of significant diagnostics from this hospitalization (including imaging, microbiology, ancillary and laboratory) are listed below for reference.    Significant Diagnostic Studies: Dg Chest 2 View  Result Date: 03/05/2016 CLINICAL DATA:  Chest pain and pressure tonight, shortness of breath. History of diabetes, smoker. EXAM: CHEST  2 VIEW COMPARISON:  Chest radiograph January 14, 2016 FINDINGS: Cardiomediastinal silhouette is normal. Mild  bronchitic changes. The lungs are clear without pleural effusions or focal consolidations. Trachea projects midline and there is no pneumothorax. Soft tissue planes and included osseous structures are non-suspicious. IMPRESSION: Mild bronchitic changes without focal consolidation. Electronically Signed   By: Elon Alas M.D.   On: 03/05/2016 01:51   Nm Myocar Multi W/spect W/wall Motion / Ef  Result Date: 03/06/2016 CLINICAL DATA:  50 year old female with chest pain EXAM: MYOCARDIAL IMAGING WITH SPECT (REST AND PHARMACOLOGIC-STRESS) GATED LEFT VENTRICULAR WALL MOTION STUDY LEFT VENTRICULAR EJECTION FRACTION TECHNIQUE: Standard myocardial SPECT imaging was performed after resting intravenous injection of 10 mCi Tc-70m tetrofosmin. Subsequently, intravenous infusion of Lexiscan was performed  under the supervision of the Cardiology staff. At peak effect of the drug, 30 mCi Tc-33m tetrofosmin was injected intravenously and standard myocardial SPECT imaging was performed. Quantitative gated imaging was also performed to evaluate left ventricular wall motion, and estimate left ventricular ejection fraction. COMPARISON:  Chest x-ray 03/05/2016 FINDINGS: Perfusion: No decreased activity in the left ventricle on stress imaging to suggest reversible ischemia or infarction. Wall Motion: Normal left ventricular wall motion. No left ventricular dilation. Left Ventricular Ejection Fraction: 62 % End diastolic volume 69 ml End systolic volume 26 ml IMPRESSION: 1. No reversible ischemia or infarction. 2. Normal left ventricular wall motion. 3. Left ventricular ejection fraction 62% 4. Non invasive risk stratification*: Low *2012 Appropriate Use Criteria for Coronary Revascularization Focused Update: J Am Coll Cardiol. [id number]. http://[email or web address]?articleid=[email or web address] Electronically Signed   By: Jacqulynn Cadet M.D.   On: 03/06/2016 11:36   Microbiology: No results found for this or any previous visit (from the past 240 hour(s)).   Labs: Basic Metabolic Panel:  Recent Labs Lab 03/05/16 0102  NA 139  K 3.7  CL 105  CO2 25  GLUCOSE 113*  BUN 13  CREATININE 0.79  CALCIUM 9.6   Liver Function Tests: No results for input(s): AST, ALT, ALKPHOS, BILITOT, PROT, ALBUMIN in the last 168 hours. No results for input(s): LIPASE, AMYLASE in the last 168 hours. No results for input(s): AMMONIA in the last 168 hours. CBC:  Recent Labs Lab 03/05/16 0102  WBC 11.9*  HGB 15.2*  HCT 44.8  MCV 83.4  PLT 337   Cardiac Enzymes:  Recent Labs Lab 03/05/16 0553 03/05/16 0721 03/05/16 1249  TROPONINI <0.03 <0.03 <0.03   BNP: BNP (last 3 results) No results for input(s): BNP in the last 8760 hours.  ProBNP (last 3 results) No results for input(s): PROBNP in the  last 8760 hours.  CBG:  Recent Labs Lab 03/05/16 1132 03/05/16 1630 03/05/16 2121 03/06/16 0618  GLUCAP 120* 123* 121* 117*       Signed:  Kelvin Cellar MD.  [facility] 03/06/2016, 12:56 PM

## 2016-03-18 ENCOUNTER — Other Ambulatory Visit: Payer: Self-pay

## 2016-03-23 ENCOUNTER — Ambulatory Visit: Payer: Medicaid Other | Admitting: Family Medicine

## 2016-04-13 ENCOUNTER — Ambulatory Visit: Payer: Medicaid Other | Admitting: Family Medicine

## 2016-04-20 ENCOUNTER — Ambulatory Visit (INDEPENDENT_AMBULATORY_CARE_PROVIDER_SITE_OTHER): Payer: Medicaid Other | Admitting: Family Medicine

## 2016-04-20 ENCOUNTER — Encounter: Payer: Self-pay | Admitting: Family Medicine

## 2016-04-20 ENCOUNTER — Encounter: Payer: Self-pay | Admitting: Internal Medicine

## 2016-04-20 VITALS — BP 127/66 | HR 66 | Temp 97.8°F | Resp 16 | Ht 59.0 in | Wt 164.0 lb

## 2016-04-20 DIAGNOSIS — R7303 Prediabetes: Secondary | ICD-10-CM | POA: Diagnosis not present

## 2016-04-20 DIAGNOSIS — E782 Mixed hyperlipidemia: Secondary | ICD-10-CM

## 2016-04-20 DIAGNOSIS — E138 Other specified diabetes mellitus with unspecified complications: Secondary | ICD-10-CM

## 2016-04-20 LAB — POCT GLYCOSYLATED HEMOGLOBIN (HGB A1C): HEMOGLOBIN A1C: 6.1

## 2016-04-20 MED ORDER — METFORMIN HCL 500 MG PO TABS: 500.0000 mg | ORAL_TABLET | Freq: Two times a day (BID) | ORAL | 3 refills | Status: DC

## 2016-04-20 MED ORDER — LISINOPRIL 2.5 MG PO TABS: 2.5000 mg | ORAL_TABLET | Freq: Every day | ORAL | 1 refills | Status: DC

## 2016-04-20 MED ORDER — PRAVASTATIN SODIUM 40 MG PO TABS: 40.0000 mg | ORAL_TABLET | Freq: Every day | ORAL | 1 refills | Status: DC

## 2016-04-20 NOTE — Progress Notes (Signed)
Jessica Lawson, is a 50 y.o. female  [id number]  [id number]  DOB - 05/25/1966  CC:  Chief Complaint  Patient presents with  . Hyperlipidemia  . Cyst    on left wrist        HPI: Jessica Lawson is a 50 y.o. female here for follow-up diabetes. She reports doing well. She is on metformin 500 bid. Her A1C today is 6.1. She has a history of hyperlipidemia, with elevated cholesterol and triglycerides. She has been on atorvostatin in the past. Her chart indicates an allergic reaction but she denies this. She was switched at some point to a fenofibrate. She was in hospital in July and was prescribe Crestor. She did not get that due to cost. At this point she is taking nothing for lipids. She reports trying to be careful with sweets and walking on treadmill 30 minutes a day. Her only complaints are musculoskeletal She has cut back on her cigarettees but still smokes a 1/2 pack a day. She denies alcohol or drug use.   Health Maintenance:  She is due tdap, influenza and pneumonia when we have available. She has not had colon cancer screening and has not seen opthalmology in over a year. She needs to schedule for a PAP soon. She has been screened for HIV in past.  Allergies  Allergen Reactions  . Asa [Aspirin] Anaphylaxis, Hives and Swelling  . Sulfur Anaphylaxis, Hives, Swelling and Other (See Comments)    thrush  . Iohexol      Desc: pt complains of difficulty swallowing and thickened tongue   . Penicillins Hives, Swelling and Other (See Comments)    Thrush   . Statins Hives, Swelling and Other (See Comments)    Thrush    Past Medical History:  Diagnosis Date  . Diabetes mellitus without complication ([place])   . High blood cholesterol   . Obesity   . Tobacco use   . UTI (lower urinary tract infection)    Current Outpatient Prescriptions on File Prior to Visit  Medication Sig Dispense Refill  . acetaminophen (TYLENOL) 325 MG tablet Take 325 mg by mouth every 6 (six) hours as  needed for mild pain.    . cetirizine (ZYRTEC) 10 MG chewable tablet Chew 1 tablet (10 mg total) by mouth daily. 30 tablet 5  . Multiple Vitamins-Minerals (MULTIVITAMIN WITH MINERALS) tablet Take 1 tablet by mouth daily.    Marland Kitchen omega-3 acid ethyl esters (LOVAZA) 1 g capsule Take 2 capsules (2 g total) by mouth 2 (two) times daily. (Patient taking differently: Take 2 g by mouth every evening. ) 60 capsule 11  . rosuvastatin (CRESTOR) 10 MG tablet Take 1 tablet (10 mg total) by mouth daily. (Patient not taking: Reported on 04/20/2016) 30 tablet 1   No current facility-administered medications on file prior to visit.    Family History  Problem Relation Age of Onset  . Hyperlipidemia Other   . Hypertension Other   . Diabetes Other    Social History   Social History  . Marital status: Single    Spouse name: N/A  . Number of children: N/A  . Years of education: N/A   Occupational History  . Not on file.   Social History Main Topics  . Smoking status: Current Every Day Smoker    Packs/day: 0.00    Types: Cigarettes  . Smokeless tobacco: Never Used  . Alcohol use No  . Drug use: No  . Sexual activity: Not on file  Other Topics Concern  . Not on file   Social History Narrative  . No narrative on file    Review of Systems: Constitutional: Negative Skin: Negative HENT: Positive for occ achy ears. Eyes: Negative  Neck: Negative Respiratory: Negative Cardiovascular: Negative for chest pain since July Gastrointestinal: Negative Genitourinary: Negative  Musculoskeletal: Positive for DDD, shoulder pain, neck pain and wrist soreness. Neurological: Negative for Hematological: Negative  Psychiatric/Behavioral: Negative    Objective:   Vitals:   04/20/16 0847  BP: 127/66  Pulse: 66  Resp: 16  Temp: 97.8 F (36.6 C)    Physical Exam: Constitutional: Patient appears well-developed and well-nourished. No distress. HENT: Normocephalic, atraumatic, External right and left  ear normal. Oropharynx is clear and moist.  Eyes: Conjunctivae and EOM are normal. PERRLA, no scleral icterus. Neck: Normal ROM. Neck supple. No lymphadenopathy, No thyromegaly. CVS: RRR, S1/S2 +, no murmurs, no gallops, no rubs Pulmonary: Effort and breath sounds normal, no stridor, rhonchi, wheezes, rales.  Abdominal: Soft. Normoactive BS,, no distension, tenderness, rebound or guarding.  Musculoskeletal: Normal range of motion. No edema and no tenderness.  Neuro: Alert.Normal muscle tone coordination. Non-focal Skin: Skin is warm and dry. No rash noted. Not diaphoretic. No erythema. No pallor. Psychiatric: Normal mood and affect. Behavior, judgment, thought content normal.  Foot exam normal.  Lab Results  Component Value Date   WBC 11.9 (H) 03/05/2016   HGB 15.2 (H) 03/05/2016   HCT 44.8 03/05/2016   MCV 83.4 03/05/2016   PLT 337 03/05/2016   Lab Results  Component Value Date   CREATININE 0.79 03/05/2016   BUN 13 03/05/2016   NA 139 03/05/2016   K 3.7 03/05/2016   CL 105 03/05/2016   CO2 25 03/05/2016    Lab Results  Component Value Date   HGBA1C 6.1 04/20/2016   Lipid Panel     Component Value Date/Time   CHOL 323 (H) 03/05/2016 0721   TRIG 363 (H) 03/05/2016 0721   HDL 30 (L) 03/05/2016 0721   CHOLHDL 10.8 03/05/2016 0721   VLDL 73 (H) 03/05/2016 0721   LDLCALC 220 (H) 03/05/2016 0721       Assessment and plan:   1. Diabetes mellitus of other type with complication ([place])  - POCT glycosylated hemoglobin (Hb A1C) - Microalbumin, urine - metFORMIN (GLUCOPHAGE) 500 MG tablet; Take 1 tablet (500 mg total) by mouth 2 (two) times daily with a meal. Reported on 09/03/2015  Dispense: 60 tablet; Refill: 3 - Ambulatory referral to Ophthalmology - Ambulatory referral to Gastroenterology   2. Hypercholesterolemia with hypertriglyceridemia -Pravastatin 40 mg, one po q day. #90 with one RF.    Return in about 3 months (around 07/20/2016).  The patient was given  clear instructions to go to ER or return to medical center if symptoms don't improve, worsen or new problems develop. The patient verbalized understanding.    Micheline Chapman FNP  04/20/2016, 9:33 AM

## 2016-04-20 NOTE — Patient Instructions (Signed)
Let me know if you have any problems with the pravastatin. Continue metformin Start lisinopril one a day. Try to work on diabetic diet.

## 2016-04-21 LAB — MICROALBUMIN, URINE: MICROALB UR: 1.4 mg/dL

## 2016-05-03 ENCOUNTER — Encounter (HOSPITAL_COMMUNITY): Payer: Self-pay | Admitting: Emergency Medicine

## 2016-05-03 ENCOUNTER — Emergency Department (HOSPITAL_COMMUNITY)
Admission: EM | Admit: 2016-05-03 | Discharge: 2016-05-03 | Disposition: A | Discharge status: Home / Self Care | Payer: Medicaid Other | Attending: Emergency Medicine | Admitting: Emergency Medicine

## 2016-05-03 DIAGNOSIS — Z79899 Other long term (current) drug therapy: Secondary | ICD-10-CM | POA: Insufficient documentation

## 2016-05-03 DIAGNOSIS — R220 Localized swelling, mass and lump, head: Secondary | ICD-10-CM | POA: Diagnosis not present

## 2016-05-03 DIAGNOSIS — Z7984 Long term (current) use of oral hypoglycemic drugs: Secondary | ICD-10-CM | POA: Insufficient documentation

## 2016-05-03 DIAGNOSIS — F17210 Nicotine dependence, cigarettes, uncomplicated: Secondary | ICD-10-CM | POA: Diagnosis not present

## 2016-05-03 DIAGNOSIS — E119 Type 2 diabetes mellitus without complications: Secondary | ICD-10-CM | POA: Insufficient documentation

## 2016-05-03 LAB — BASIC METABOLIC PANEL
Anion gap: 8 (ref 5–15)
BUN: 11 mg/dL (ref 6–20)
CALCIUM: 9.7 mg/dL (ref 8.9–10.3)
CHLORIDE: 105 mmol/L (ref 101–111)
CO2: 26 mmol/L (ref 22–32)
Creatinine, Ser: 0.81 mg/dL (ref 0.44–1.00)
GFR calc Af Amer: >60 mL/min (ref >60)
Glucose, Bld: 101 mg/dL — ABNORMAL HIGH (ref 65–99)
POTASSIUM: 3.8 mmol/L (ref 3.5–5.1)
SODIUM: 139 mmol/L (ref 135–145)

## 2016-05-03 LAB — CBC WITH DIFFERENTIAL/PLATELET
BASOS ABS: 0 10*3/uL (ref 0.0–0.1)
BASOS PCT: 0 %
Eosinophils Absolute: 0.1 10*3/uL (ref 0.0–0.7)
Eosinophils Relative: 1 %
HEMATOCRIT: 45.1 % (ref 36.0–46.0)
HEMOGLOBIN: 15.3 g/dL — AB (ref 12.0–15.0)
LYMPHS PCT: 51 %
Lymphs Abs: 4 10*3/uL (ref 0.7–4.0)
MCH: 28.5 pg (ref 26.0–34.0)
MCHC: 33.9 g/dL (ref 30.0–36.0)
MCV: 84.1 fL (ref 78.0–100.0)
Monocytes Absolute: 0.4 10*3/uL (ref 0.1–1.0)
Monocytes Relative: 6 %
NEUTROS ABS: 3.3 10*3/uL (ref 1.7–7.7)
NEUTROS PCT: 42 %
Platelets: 336 10*3/uL (ref 150–400)
RBC: 5.36 MIL/uL — ABNORMAL HIGH (ref 3.87–5.11)
RDW: 13.5 % (ref 11.5–15.5)
WBC: 7.8 10*3/uL (ref 4.0–10.5)

## 2016-05-03 MED ORDER — CLINDAMYCIN HCL 150 MG PO CAPS: 150.0000 mg | ORAL_CAPSULE | Freq: Four times a day (QID) | ORAL | 0 refills | Status: DC

## 2016-05-03 NOTE — ED Notes (Signed)
Pt received education/discharge summary. Denies questions. VS stable. Discharging home

## 2016-05-03 NOTE — ED Triage Notes (Signed)
Pt reports swelling Saturday to right side of face; denies pain or tenderness upon palpation; no dental issues. States she feels lightheaded and nauseated. It started on Saturday. States may have had some fevers but unsure. Also states left hand hurts.

## 2016-05-03 NOTE — ED Notes (Signed)
Pt updated on waiting status, pt has no complaints

## 2016-05-03 NOTE — ED Notes (Signed)
Pt here for c/o right face and neck swelling and numbness to right face. States it "feels tight". Has had slight difficulty swallowing.

## 2016-05-03 NOTE — ED Provider Notes (Signed)
[facility] DEPT Provider Note   CSN: [id number] Arrival date & time: 05/03/16  1125     History   Chief Complaint Chief Complaint  Patient presents with  . Facial Swelling    HPI Jessica Lawson is a 50 y.o. female.  HPI Patient is a 50 year old female past medical history of hyperlipidemia and diabetes comes in today complaint lightheadedness dizziness as well as a right-sided facial swelling.  Patient states that she has had lightheadedness and dizziness for some time now admits that she does not eat as she should.  Patient states she really had a cup of coffee this morning no food.  Patient standing in the kitchen when she had sudden onset of lightheadedness and dizziness with no syncope.  Patient states that these episodes are typical of her previous.  Additionally patient is complaining of a right-sided facial swelling with pain.  Patient states this began approximately 2 days ago and has worsened since that time.  Patient denies fevers chills nausea vomiting.  Patient does believe that she chipped  her right rear molar recently. Past Medical History:  Diagnosis Date  . Diabetes mellitus without complication ([place])   . High blood cholesterol   . Obesity   . Tobacco use   . UTI (lower urinary tract infection)     Patient Active Problem List   Diagnosis Date Noted  . Chest pain 03/05/2016  . Acute bronchitis 03/05/2016  . Diabetes mellitus without complication ([place])   . Tobacco use   . Tobacco dependence 01/12/2016  . Environmental allergies 10/26/2015  . Hypercholesterolemia with hypertriglyceridemia 09/20/2015  . Hyperlipidemia 09/05/2015  . Right shoulder pain 09/05/2015  . Mouth ulcers 09/05/2015    Past Surgical History:  Procedure Laterality Date  . CESAREAN SECTION      OB History    No data available       Home Medications    Prior to Admission medications   Medication Sig Start Date End Date Taking? Authorizing Provider  acetaminophen (TYLENOL)  325 MG tablet Take 325 mg by mouth every 6 (six) hours as needed for mild pain.   Yes Historical Provider, MD  lisinopril (ZESTRIL) 2.5 MG tablet Take 1 tablet (2.5 mg total) by mouth daily. 04/20/16  Yes Micheline Chapman, NP  metFORMIN (GLUCOPHAGE) 500 MG tablet Take 1 tablet (500 mg total) by mouth 2 (two) times daily with a meal. Reported on 09/03/2015 04/20/16  Yes Micheline Chapman, NP  Multiple Vitamins-Minerals (MULTIVITAMIN WITH MINERALS) tablet Take 1 tablet by mouth daily.   Yes Historical Provider, MD  omega-3 acid ethyl esters (LOVAZA) 1 g capsule Take 2 capsules (2 g total) by mouth 2 (two) times daily. Patient taking differently: Take 2 g by mouth every evening.  01/07/16  Yes Dorena Dew, FNP  pravastatin (PRAVACHOL) 40 MG tablet Take 1 tablet (40 mg total) by mouth daily. 04/20/16  Yes Micheline Chapman, NP  cetirizine (ZYRTEC) 10 MG chewable tablet Chew 1 tablet (10 mg total) by mouth daily. Patient not taking: Reported on 05/03/2016 10/26/15   Dorena Dew, FNP  clindamycin (CLEOCIN) 150 MG capsule Take 1 capsule (150 mg total) by mouth every 6 (six) hours. 05/03/16   Chapman Moss, MD  rosuvastatin (CRESTOR) 10 MG tablet Take 1 tablet (10 mg total) by mouth daily. Patient not taking: Reported on 05/03/2016 03/06/16   Kelvin Cellar, MD    Family History Family History  Problem Relation Age of Onset  . Hyperlipidemia Other   .  Hypertension Other   . Diabetes Other     Social History Social History  Substance Use Topics  . Smoking status: Current Every Day Smoker    Packs/day: 0.00    Types: Cigarettes  . Smokeless tobacco: Never Used  . Alcohol use No     Allergies   Asa [aspirin]; Iohexol; Sulfa antibiotics; Penicillins; and Statins   Review of Systems Review of Systems  Constitutional: Negative for chills, fatigue and fever.  HENT: Positive for dental problem and facial swelling. Negative for sore throat, trouble swallowing and voice change.   Respiratory:  Negative for chest tightness and shortness of breath.   Cardiovascular: Negative for chest pain and leg swelling.  Gastrointestinal: Negative for abdominal pain.  Neurological: Positive for dizziness and light-headedness. Negative for speech difficulty, weakness and numbness.  All other systems reviewed and are negative.    Physical Exam Updated Vital Signs BP 123/82 (BP Location: Right Arm)   Pulse (!) 55   Temp 97.7 F (36.5 C) (Oral)   Resp 19   LMP 08/22/2011   SpO2 99%   Physical Exam  Constitutional: She appears well-developed and well-nourished. No distress.  HENT:  Head: Normocephalic and atraumatic.  As in MDM  Eyes: Conjunctivae are normal.  Neck: Neck supple. No tracheal deviation present. No thyromegaly present.  Cardiovascular: Normal rate and regular rhythm.   No murmur heard. Pulmonary/Chest: Effort normal and breath sounds normal. No stridor. No respiratory distress.  Abdominal: Soft. There is no tenderness.  Musculoskeletal: She exhibits no edema.  Neurological: She is alert.  Skin: Skin is warm and dry.  Psychiatric: She has a normal mood and affect.  Nursing note and vitals reviewed.    ED Treatments / Results  Labs (all labs ordered are listed, but only abnormal results are displayed) Labs Reviewed  BASIC METABOLIC PANEL - Abnormal; Notable for the following:       Result Value   Glucose, Bld 101 (*)    All other components within normal limits  CBC WITH DIFFERENTIAL/PLATELET - Abnormal; Notable for the following:    RBC 5.36 (*)    Hemoglobin 15.3 (*)    All other components within normal limits    EKG  EKG Interpretation None       Radiology No results found.  Procedures Procedures (including critical care time)  Medications Ordered in ED Medications - No data to display   Initial Impression / Assessment and Plan / ED Course  I have reviewed the triage vital signs and the nursing notes.  Pertinent labs & imaging results that  were available during my care of the patient were reviewed by me and considered in my medical decision making (see chart for details).  Clinical Course    Patient is a 50 year old female past medical history of hyperlipidemia and diabetes comes in today complaint lightheadedness dizziness as well as a right-sided facial swelling.  Patient states that she has had lightheadedness and dizziness for some time now admits that she does not eat as she should.  Patient states she really had a cup of coffee this morning no food.  Patient standing in the kitchen when she had sudden onset of lightheadedness and dizziness with no syncope.  Patient states that these episodes are typical of her previous.  Additionally patient is complaining of a right-sided facial swelling with pain.  Patient states this began approximately 2 days ago and has worsened since that time.  Patient denies fevers chills nausea vomiting.  Patient does  believe that she chipped  her right rear molar recently.  Physical exam: Patient clear to auscultation bilaterally heart tones normal abdomen soft nontender.  Examination of patient's teeth shows several repaired dental caries with a possible chip of the upper right rear molar.  Patient is tenderness palpation over the right side of the face.  No erythema or warmth noted.  Laboratory workup largely within normal limits.  Believe patient to have early dental abscess formation.  Will prescribe patient appropriate antibiotic. Pt given appropriate f/u and return precautions. Pt voiced understanding and is agreeable to discharge at this time.     Final Clinical Impressions(s) / ED Diagnoses   Final diagnoses:  Facial swelling    New Prescriptions Discharge Medication List as of 05/03/2016  5:42 PM    START taking these medications   Details  clindamycin (CLEOCIN) 150 MG capsule Take 1 capsule (150 mg total) by mouth every 6 (six) hours., Starting Tue 05/03/2016, Print         Chapman Moss, MD 05/03/16 2317    Forde Dandy, MD 05/04/16 [id number]

## 2016-05-03 NOTE — ED Notes (Signed)
PA did medical screening. Pt to be moved back to lobby for further workup.

## 2016-05-03 NOTE — ED Provider Notes (Signed)
I saw and evaluated the patient, reviewed the resident's note and I agree with the findings and plan.   EKG Interpretation None      50 year old female who presents with tingling and swelling over the left face. Onset earlier today and reports associating mild lightheadedness.  no difficulty breathing, difficulty swallowing, sore throat, fevers. Afebrile hemodynamically stable. With subtle soft tissue swelling over the right jaw over the parotid gland with mild tenderness. Question parotitis. Does intermittently endorse some right upper molar dental pain with associating fractured tooth. Possible early dental abscess as well. We'll treat with limited drops and antibiotics and dental f/u outpatient. No concern for deep neck soft tissue infection.    Forde Dandy, MD 05/03/16 [phone]

## 2016-06-07 ENCOUNTER — Ambulatory Visit (AMBULATORY_SURGERY_CENTER): Payer: Self-pay

## 2016-06-07 VITALS — Ht 59.0 in | Wt 164.0 lb

## 2016-06-07 DIAGNOSIS — Z1211 Encounter for screening for malignant neoplasm of colon: Secondary | ICD-10-CM

## 2016-06-07 MED ORDER — NA SULFATE-K SULFATE-MG SULF 17.5-3.13-1.6 GM/177ML PO SOLN: ORAL | 0 refills | Status: DC

## 2016-06-07 NOTE — Progress Notes (Signed)
Pt came into the office today for her pre-visit prior to her colonoscopy with Dr. Hilarie Fredrickson on 06/21/16. After reviewing medication and medical history and consent form with pt, she changed her mind! She did not want to do the colon at this time. Colon on 06/21/16 will be cx'd and the pt will call back later to reschedule.

## 2016-06-09 LAB — HM DIABETES EYE EXAM

## 2016-06-17 ENCOUNTER — Other Ambulatory Visit: Payer: Self-pay | Admitting: Family Medicine

## 2016-06-17 DIAGNOSIS — N6001 Solitary cyst of right breast: Secondary | ICD-10-CM

## 2016-06-21 ENCOUNTER — Encounter: Payer: Medicaid Other | Admitting: Internal Medicine

## 2016-06-23 ENCOUNTER — Encounter (HOSPITAL_COMMUNITY): Payer: Self-pay | Admitting: Emergency Medicine

## 2016-06-23 ENCOUNTER — Emergency Department (HOSPITAL_COMMUNITY)
Admission: EM | Admit: 2016-06-23 | Discharge: 2016-06-23 | Disposition: A | Discharge status: Home / Self Care | Payer: Medicaid Other | Attending: Emergency Medicine | Admitting: Emergency Medicine

## 2016-06-23 ENCOUNTER — Emergency Department (HOSPITAL_COMMUNITY): Payer: Medicaid Other

## 2016-06-23 DIAGNOSIS — M545 Low back pain, unspecified: Secondary | ICD-10-CM

## 2016-06-23 DIAGNOSIS — R109 Unspecified abdominal pain: Secondary | ICD-10-CM | POA: Insufficient documentation

## 2016-06-23 DIAGNOSIS — F17210 Nicotine dependence, cigarettes, uncomplicated: Secondary | ICD-10-CM | POA: Diagnosis not present

## 2016-06-23 DIAGNOSIS — E119 Type 2 diabetes mellitus without complications: Secondary | ICD-10-CM | POA: Diagnosis not present

## 2016-06-23 DIAGNOSIS — I10 Essential (primary) hypertension: Secondary | ICD-10-CM | POA: Insufficient documentation

## 2016-06-23 DIAGNOSIS — M549 Dorsalgia, unspecified: Secondary | ICD-10-CM | POA: Diagnosis present

## 2016-06-23 DIAGNOSIS — Z7984 Long term (current) use of oral hypoglycemic drugs: Secondary | ICD-10-CM | POA: Diagnosis not present

## 2016-06-23 LAB — URINALYSIS, ROUTINE W REFLEX MICROSCOPIC
BILIRUBIN URINE: NEGATIVE
Glucose, UA: NEGATIVE mg/dL
KETONES UR: NEGATIVE mg/dL
Leukocytes, UA: NEGATIVE
NITRITE: NEGATIVE
PROTEIN: NEGATIVE mg/dL
SPECIFIC GRAVITY, URINE: 1.018 (ref 1.005–1.030)
pH: 5.5 (ref 5.0–8.0)

## 2016-06-23 LAB — CBC WITH DIFFERENTIAL/PLATELET
BASOS ABS: 0 10*3/uL (ref 0.0–0.1)
BASOS PCT: 0 %
EOS ABS: 0.1 10*3/uL (ref 0.0–0.7)
Eosinophils Relative: 1 %
HCT: 40.3 % (ref 36.0–46.0)
HEMOGLOBIN: 13.9 g/dL (ref 12.0–15.0)
LYMPHS ABS: 3.5 10*3/uL (ref 0.7–4.0)
Lymphocytes Relative: 46 %
MCH: 28.4 pg (ref 26.0–34.0)
MCHC: 34.5 g/dL (ref 30.0–36.0)
MCV: 82.2 fL (ref 78.0–100.0)
Monocytes Absolute: 0.4 10*3/uL (ref 0.1–1.0)
Monocytes Relative: 6 %
NEUTROS PCT: 47 %
Neutro Abs: 3.5 10*3/uL (ref 1.7–7.7)
Platelets: 314 10*3/uL (ref 150–400)
RBC: 4.9 MIL/uL (ref 3.87–5.11)
RDW: 13.3 % (ref 11.5–15.5)
WBC: 7.4 10*3/uL (ref 4.0–10.5)

## 2016-06-23 LAB — COMPREHENSIVE METABOLIC PANEL
ALBUMIN: 3.9 g/dL (ref 3.5–5.0)
ALK PHOS: 76 U/L (ref 38–126)
ALT: 27 U/L (ref 14–54)
AST: 22 U/L (ref 15–41)
Anion gap: 8 (ref 5–15)
BUN: 9 mg/dL (ref 6–20)
CALCIUM: 9.4 mg/dL (ref 8.9–10.3)
CHLORIDE: 106 mmol/L (ref 101–111)
CO2: 27 mmol/L (ref 22–32)
CREATININE: 0.76 mg/dL (ref 0.44–1.00)
GFR calc Af Amer: >60 mL/min (ref >60)
GFR calc non Af Amer: >60 mL/min (ref >60)
GLUCOSE: 109 mg/dL — AB (ref 65–99)
Potassium: 3.5 mmol/L (ref 3.5–5.1)
SODIUM: 141 mmol/L (ref 135–145)
Total Bilirubin: 0.4 mg/dL (ref 0.3–1.2)
Total Protein: 6.9 g/dL (ref 6.5–8.1)

## 2016-06-23 LAB — URINE MICROSCOPIC-ADD ON
BACTERIA UA: NONE SEEN
WBC, UA: NONE SEEN WBC/hpf (ref 0–5)

## 2016-06-23 LAB — LIPASE, BLOOD: Lipase: 25 U/L (ref 11–51)

## 2016-06-23 MED ORDER — METHOCARBAMOL 500 MG PO TABS: 500.0000 mg | ORAL_TABLET | Freq: Two times a day (BID) | ORAL | 0 refills | Status: DC | PRN

## 2016-06-23 MED ORDER — ACETAMINOPHEN 325 MG PO TABS: 650.0000 mg | ORAL_TABLET | Freq: Four times a day (QID) | ORAL | 0 refills | Status: DC | PRN

## 2016-06-23 NOTE — ED Triage Notes (Addendum)
Pt from home with c/o lower bilateral back pain x 3 days worse with movement.  Pt reports the pain on the left side is radiating to her left upper back.  Pt states her urine is lighter than normal.  Hx of degenerative disc. Pt ambulatory to room, NAD, A&O.

## 2016-06-23 NOTE — ED Notes (Signed)
Declined W/C at D/C and was escorted to lobby by RN. 

## 2016-06-23 NOTE — ED Provider Notes (Signed)
[facility] DEPT Provider Note   CSN: [id number] Arrival date & time: 06/23/16  1125     History   Chief Complaint Chief Complaint  Patient presents with  . Back Pain    HPI Jessica Lawson is a 50 y.o. female.   Back Pain   This is a new problem. The current episode started more than 2 days ago. The problem occurs constantly. The pain is associated with no known injury. The quality of the pain is described as cramping and aching. The pain does not radiate. The pain is moderate. The symptoms are aggravated by bending and twisting. Pertinent negatives include no abdominal pain.    Past Medical History:  Diagnosis Date  . Diabetes mellitus without complication ([place])   . High blood cholesterol   . Hypertension   . Obesity   . Pain of cheek    and right side neck  . Tobacco use   . UTI (lower urinary tract infection)   . Wrist pain    left wrist    Patient Active Problem List   Diagnosis Date Noted  . Chest pain 03/05/2016  . Acute bronchitis 03/05/2016  . Diabetes mellitus without complication ([place])   . Tobacco use   . Tobacco dependence 01/12/2016  . Environmental allergies 10/26/2015  . Hypercholesterolemia with hypertriglyceridemia 09/20/2015  . Hyperlipidemia 09/05/2015  . Right shoulder pain 09/05/2015  . Mouth ulcers 09/05/2015    Past Surgical History:  Procedure Laterality Date  . CESAREAN SECTION     3 c-sections    OB History    No data available       Home Medications    Prior to Admission medications   Medication Sig Start Date End Date Taking? Authorizing Provider  acetaminophen (TYLENOL) 325 MG tablet Take 2 tablets (650 mg total) by mouth every 6 (six) hours as needed. 06/23/16   Merrily Pew, MD  cetirizine (ZYRTEC) 10 MG chewable tablet Chew 1 tablet (10 mg total) by mouth daily. Patient not taking: Reported on 06/07/2016 10/26/15   Dorena Dew, FNP  clindamycin (CLEOCIN) 150 MG capsule Take 1 capsule (150 mg total) by mouth  every 6 (six) hours. 05/03/16   Chapman Moss, MD  lisinopril (ZESTRIL) 2.5 MG tablet Take 1 tablet (2.5 mg total) by mouth daily. 04/20/16   Micheline Chapman, NP  loratadine (CLARITIN) 10 MG tablet Take 10 mg by mouth daily.    Historical Provider, MD  metFORMIN (GLUCOPHAGE) 500 MG tablet Take 1 tablet (500 mg total) by mouth 2 (two) times daily with a meal. Reported on 09/03/2015 04/20/16   Micheline Chapman, NP  methocarbamol (ROBAXIN) 500 MG tablet Take 1 tablet (500 mg total) by mouth 2 (two) times daily as needed for muscle spasms. 06/23/16   Merrily Pew, MD  Multiple Vitamins-Minerals (MULTIVITAMIN WITH MINERALS) tablet Take 1 tablet by mouth daily.    Historical Provider, MD  Na Sulfate-K Sulfate-Mg Sulf (SUPREP BOWEL PREP KIT) 17.5-3.13-1.6 GM/180ML SOLN Suprep as directed / no substitutions 06/07/16   Jerene Bears, MD  omega-3 acid ethyl esters (LOVAZA) 1 g capsule Take 2 capsules (2 g total) by mouth 2 (two) times daily. Patient taking differently: Take 2 g by mouth every evening.  01/07/16   Dorena Dew, FNP  pravastatin (PRAVACHOL) 40 MG tablet Take 1 tablet (40 mg total) by mouth daily. 04/20/16   Micheline Chapman, NP  rosuvastatin (CRESTOR) 10 MG tablet Take 1 tablet (10 mg total) by mouth daily. Patient  not taking: Reported on 06/07/2016 03/06/16   Kelvin Cellar, MD    Family History Family History  Problem Relation Age of Onset  . Hyperlipidemia Other   . Hypertension Other   . Diabetes Other   . Diabetes Mother   . Lung disease Father   . Diabetes Sister   . Diabetes Sister   . Hypertension Sister     Social History Social History  Substance Use Topics  . Smoking status: Current Every Day Smoker    Packs/day: 1.00    Types: Cigarettes  . Smokeless tobacco: Never Used  . Alcohol use No     Allergies   Asa [aspirin]; Iohexol; Penicillins; Sulfa antibiotics; and Statins   Review of Systems Review of Systems  Constitutional: Negative for activity change.    Gastrointestinal: Negative for abdominal pain.  Musculoskeletal: Negative for arthralgias and back pain.  All other systems reviewed and are negative.    Physical Exam Updated Vital Signs BP 146/71 (BP Location: Left Arm)   Pulse (!) 57   Temp 97.9 F (36.6 C) (Oral)   Resp 18   LMP 08/22/2011   SpO2 97%   Physical Exam  Constitutional: She is oriented to person, place, and time. She appears well-developed and well-nourished.  HENT:  Head: Normocephalic and atraumatic.  Eyes: Conjunctivae and EOM are normal.  Neck: Normal range of motion.  Cardiovascular: Normal rate and regular rhythm.   Pulmonary/Chest: No stridor. No respiratory distress. She has no wheezes.  Abdominal: Soft. She exhibits no distension. There is no tenderness.  Musculoskeletal: Normal range of motion. She exhibits tenderness (mild bilateral lower back). She exhibits no edema or deformity.  Neurological: She is alert and oriented to person, place, and time. No cranial nerve deficit.  Nursing note and vitals reviewed.    ED Treatments / Results  Labs (all labs ordered are listed, but only abnormal results are displayed) Labs Reviewed  URINALYSIS, [place] (NOT AT [facility]) - Abnormal; Notable for the following:       Result Value   Hgb urine dipstick MODERATE (*)    All other components within normal limits  COMPREHENSIVE METABOLIC PANEL - Abnormal; Notable for the following:    Glucose, Bld 109 (*)    All other components within normal limits  URINE MICROSCOPIC-ADD ON - Abnormal; Notable for the following:    Squamous Epithelial / LPF 0-5 (*)    All other components within normal limits  CBC WITH DIFFERENTIAL/PLATELET  LIPASE, BLOOD    EKG  EKG Interpretation None       Radiology Ct Renal Stone Study  Result Date: 06/23/2016 CLINICAL DATA:  Initial evaluation for acute bilateral flank pain for 2 days. History of renal stones. EXAM: CT ABDOMEN AND PELVIS WITHOUT CONTRAST  TECHNIQUE: Multidetector CT imaging of the abdomen and pelvis was performed following the standard protocol without IV contrast. COMPARISON:  Prior CT from 02/22/2014 FINDINGS: Lower chest: Hazy atelectatic changes seen dependently within the visualized lung bases. Visualized lungs are otherwise clear. Hepatobiliary: The liver demonstrates a normal unenhanced appearance. Gallbladder within normal limits. No biliary dilatation. Pancreas: Pancreas within normal limits. Spleen: Spleen within normal limits. Adrenals/Urinary Tract: Adrenal glands are normal. Kidneys equal in size without evidence for nephrolithiasis or hydronephrosis. No radiopaque calculi seen along the course of either renal collecting system. There is no hydroureter. Bladder decompressed without acute abnormality. No layering stones seen within the bladder lumen. Stomach/Bowel: Stomach within normal limits. No evidence for bowel obstruction. No  abnormal wall thickening or inflammatory fat stranding seen about the bowels. Appendix within normal limits. Vascular/Lymphatic: Intra-abdominal aorta of normal caliber. No adenopathy. Reproductive: Uterus and ovaries within normal limits. Other: No free intraperitoneal air.  No free fluid. Musculoskeletal: No acute osseous abnormality. No worrisome lytic or blastic osseous lesions. IMPRESSION: 1. No CT evidence for nephrolithiasis or obstructive uropathy. 2. No other acute intra-abdominal or pelvic process identified. Electronically Signed   By: Jeannine Boga M.D.   On: 06/23/2016 14:27    Procedures Procedures (including critical care time)  Medications Ordered in ED Medications - No data to display   Initial Impression / Assessment and Plan / ED Course  I have reviewed the triage vital signs and the nursing notes.  Pertinent labs & imaging results that were available during my care of the patient were reviewed by me and considered in my medical decision making (see chart for  details).  Clinical Course     Likely msk. No e/o stone, infection, shingles or othercauses for symptoms will treat conservatively with close PCP follow up.   Final Clinical Impressions(s) / ED Diagnoses   Final diagnoses:  Bilateral low back pain without sciatica, unspecified chronicity    New Prescriptions Discharge Medication List as of 06/23/2016  2:41 PM    START taking these medications   Details  methocarbamol (ROBAXIN) 500 MG tablet Take 1 tablet (500 mg total) by mouth 2 (two) times daily as needed for muscle spasms., Starting Thu 06/23/2016, Print         Merrily Pew, MD 06/23/16 2123

## 2016-06-27 ENCOUNTER — Ambulatory Visit
Admission: RE | Admit: 2016-06-27 | Discharge: 2016-06-27 | Disposition: A | Discharge status: Home / Self Care | Payer: Medicaid Other | Source: Ambulatory Visit | Attending: Family Medicine | Admitting: Family Medicine

## 2016-06-27 DIAGNOSIS — N6001 Solitary cyst of right breast: Secondary | ICD-10-CM

## 2016-07-26 ENCOUNTER — Encounter (HOSPITAL_COMMUNITY): Payer: Self-pay | Admitting: Emergency Medicine

## 2016-07-26 ENCOUNTER — Emergency Department (HOSPITAL_COMMUNITY): Payer: Medicaid Other

## 2016-07-26 ENCOUNTER — Emergency Department (HOSPITAL_COMMUNITY)
Admission: EM | Admit: 2016-07-26 | Discharge: 2016-07-26 | Disposition: A | Discharge status: Home / Self Care | Payer: Medicaid Other | Attending: Emergency Medicine | Admitting: Emergency Medicine

## 2016-07-26 DIAGNOSIS — R072 Precordial pain: Secondary | ICD-10-CM | POA: Diagnosis present

## 2016-07-26 DIAGNOSIS — E119 Type 2 diabetes mellitus without complications: Secondary | ICD-10-CM | POA: Insufficient documentation

## 2016-07-26 DIAGNOSIS — Z7984 Long term (current) use of oral hypoglycemic drugs: Secondary | ICD-10-CM | POA: Diagnosis not present

## 2016-07-26 DIAGNOSIS — I10 Essential (primary) hypertension: Secondary | ICD-10-CM | POA: Diagnosis not present

## 2016-07-26 DIAGNOSIS — Z79899 Other long term (current) drug therapy: Secondary | ICD-10-CM | POA: Diagnosis not present

## 2016-07-26 DIAGNOSIS — F17210 Nicotine dependence, cigarettes, uncomplicated: Secondary | ICD-10-CM | POA: Insufficient documentation

## 2016-07-26 DIAGNOSIS — R0789 Other chest pain: Secondary | ICD-10-CM | POA: Diagnosis not present

## 2016-07-26 DIAGNOSIS — K0889 Other specified disorders of teeth and supporting structures: Secondary | ICD-10-CM

## 2016-07-26 DIAGNOSIS — K029 Dental caries, unspecified: Secondary | ICD-10-CM | POA: Insufficient documentation

## 2016-07-26 LAB — CBC
HCT: 41.6 % (ref 36.0–46.0)
HEMOGLOBIN: 14.6 g/dL (ref 12.0–15.0)
MCH: 28.7 pg (ref 26.0–34.0)
MCHC: 35.1 g/dL (ref 30.0–36.0)
MCV: 81.9 fL (ref 78.0–100.0)
Platelets: 304 10*3/uL (ref 150–400)
RBC: 5.08 MIL/uL (ref 3.87–5.11)
RDW: 13.2 % (ref 11.5–15.5)
WBC: 7.3 10*3/uL (ref 4.0–10.5)

## 2016-07-26 LAB — I-STAT TROPONIN, ED
TROPONIN I, POC: 0 ng/mL (ref 0.00–0.08)
TROPONIN I, POC: 0 ng/mL (ref 0.00–0.08)

## 2016-07-26 LAB — BASIC METABOLIC PANEL
ANION GAP: 8 (ref 5–15)
BUN: 11 mg/dL (ref 6–20)
CALCIUM: 9.5 mg/dL (ref 8.9–10.3)
CO2: 25 mmol/L (ref 22–32)
Chloride: 108 mmol/L (ref 101–111)
Creatinine, Ser: 0.74 mg/dL (ref 0.44–1.00)
Glucose, Bld: 110 mg/dL — ABNORMAL HIGH (ref 65–99)
Potassium: 4 mmol/L (ref 3.5–5.1)
Sodium: 141 mmol/L (ref 135–145)

## 2016-07-26 MED ORDER — ONDANSETRON 4 MG PO TBDP
4.0000 mg | ORAL_TABLET | Freq: Once | ORAL | Status: DC
  Filled 2016-07-26: qty 1

## 2016-07-26 MED ORDER — ONDANSETRON HCL 4 MG/2ML IJ SOLN: 4.0000 mg | Freq: Once | INTRAMUSCULAR | Status: DC

## 2016-07-26 MED ORDER — CLINDAMYCIN HCL 150 MG PO CAPS: 150.0000 mg | ORAL_CAPSULE | Freq: Three times a day (TID) | ORAL | 0 refills | Status: DC

## 2016-07-26 NOTE — ED Triage Notes (Signed)
Per EMS: pt sts some lightheadedness and htn this am; pt sts is not taking RX for htn; pt now improved to SBP 170; pt sts feeling better; pt sts increased stress recently

## 2016-07-26 NOTE — ED Provider Notes (Signed)
[facility] DEPT Provider Note   CSN: [id number] Arrival date & time: 07/26/16  1123     History   Chief Complaint Chief Complaint  Patient presents with  . Hypertension    HPI Jessica Lawson is a 50 y.o. female who presents with chest pain. Past medical history significant for DM, HTN, HLD, tobacco use, anxiety. She states that she was sitting at her computer today when she felt an acute onset of palpitations. She then started to get lightheaded and very anxious. She states she felt anxious because she had a job interview today. She then started to experience substernal chest pressure. It is constant, mild, nonradiating. Her husband called EMS. EMS noted her blood pressure to be in the systolic 132. Recheck was 170/80. She has been mildly hypertensive here in the ED. She states that she is supposed to be on medication for her blood pressure however she does not take it because she feels that her blood pressures are always normal when she goes to her family doctor. She also endorses some right sided neck pain and "head tightness" on the right side. She reports she has a fractured tooth on the right upper side which may be contributing to the pain. She denies any fever, chills, leg swelling, shortness of breath, cough, abdominal pain, vomiting.   HPI  Past Medical History:  Diagnosis Date  . Diabetes mellitus without complication ([place])   . High blood cholesterol   . Hypertension   . Obesity   . Pain of cheek    and right side neck  . Tobacco use   . UTI (lower urinary tract infection)   . Wrist pain    left wrist    Patient Active Problem List   Diagnosis Date Noted  . Chest pain 03/05/2016  . Acute bronchitis 03/05/2016  . Diabetes mellitus without complication ([place])   . Tobacco use   . Tobacco dependence 01/12/2016  . Environmental allergies 10/26/2015  . Hypercholesterolemia with hypertriglyceridemia 09/20/2015  . Hyperlipidemia 09/05/2015  . Right shoulder pain  09/05/2015  . Mouth ulcers 09/05/2015    Past Surgical History:  Procedure Laterality Date  . CESAREAN SECTION     3 c-sections    OB History    No data available       Home Medications    Prior to Admission medications   Medication Sig Start Date End Date Taking? Authorizing Provider  acetaminophen (TYLENOL) 325 MG tablet Take 2 tablets (650 mg total) by mouth every 6 (six) hours as needed. 06/23/16  Yes Merrily Pew, MD  loratadine (CLARITIN) 10 MG tablet Take 10 mg by mouth daily.   Yes Historical Provider, MD  metFORMIN (GLUCOPHAGE) 500 MG tablet Take 1 tablet (500 mg total) by mouth 2 (two) times daily with a meal. Reported on 09/03/2015 04/20/16  Yes Micheline Chapman, NP  Multiple Vitamins-Minerals (MULTIVITAMIN WITH MINERALS) tablet Take 1 tablet by mouth daily.   Yes Historical Provider, MD  Omega-3 Fatty Acids (FISH OIL) 1000 MG CPDR Take 1,000 mg by mouth daily.   Yes Historical Provider, MD  pravastatin (PRAVACHOL) 40 MG tablet Take 1 tablet (40 mg total) by mouth daily. 04/20/16  Yes Micheline Chapman, NP  cetirizine (ZYRTEC) 10 MG chewable tablet Chew 1 tablet (10 mg total) by mouth daily. Patient not taking: Reported on 07/26/2016 10/26/15   Dorena Dew, FNP  clindamycin (CLEOCIN) 150 MG capsule Take 1 capsule (150 mg total) by mouth every 6 (six) hours. Patient not  taking: Reported on 07/26/2016 05/03/16   Chapman Moss, MD  lisinopril (ZESTRIL) 2.5 MG tablet Take 1 tablet (2.5 mg total) by mouth daily. Patient not taking: Reported on 07/26/2016 04/20/16   Micheline Chapman, NP  methocarbamol (ROBAXIN) 500 MG tablet Take 1 tablet (500 mg total) by mouth 2 (two) times daily as needed for muscle spasms. Patient not taking: Reported on 07/26/2016 06/23/16   Merrily Pew, MD  Na Sulfate-K Sulfate-Mg Sulf (SUPREP BOWEL PREP KIT) 17.5-3.13-1.6 GM/180ML SOLN Suprep as directed / no substitutions Patient not taking: Reported on 07/26/2016 06/07/16   Jerene Bears, MD  omega-3 acid  ethyl esters (LOVAZA) 1 g capsule Take 2 capsules (2 g total) by mouth 2 (two) times daily. Patient not taking: Reported on 07/26/2016 01/07/16   Dorena Dew, FNP  rosuvastatin (CRESTOR) 10 MG tablet Take 1 tablet (10 mg total) by mouth daily. Patient not taking: Reported on 07/26/2016 03/06/16   Kelvin Cellar, MD    Family History Family History  Problem Relation Age of Onset  . Hyperlipidemia Other   . Hypertension Other   . Diabetes Other   . Diabetes Mother   . Lung disease Father   . Diabetes Sister   . Diabetes Sister   . Hypertension Sister     Social History Social History  Substance Use Topics  . Smoking status: Current Every Day Smoker    Packs/day: 1.00    Types: Cigarettes  . Smokeless tobacco: Never Used  . Alcohol use No     Allergies   Asa [aspirin]; Iohexol; Penicillins; Sulfa antibiotics; and Statins   Review of Systems Review of Systems  Constitutional: Negative for chills and fever.  HENT: Positive for dental problem.   Respiratory: Negative for cough and shortness of breath.   Cardiovascular: Positive for chest pain and palpitations. Negative for leg swelling.  Gastrointestinal: Positive for nausea. Negative for abdominal pain and vomiting.  Neurological: Positive for light-headedness. Negative for syncope.  All other systems reviewed and are negative.    Physical Exam Updated Vital Signs BP 147/71 (BP Location: Right Arm)   Pulse 71   Temp 97.6 F (36.4 C) (Oral)   Resp 18   LMP 08/22/2011   SpO2 98%   Physical Exam  Constitutional: She is oriented to person, place, and time. She appears well-developed and well-nourished. No distress.  HENT:  Head: Normocephalic and atraumatic.  Mouth/Throat: No trismus in the jaw. Dental caries present. No dental abscesses.  Eyes: Conjunctivae are normal. Pupils are equal, round, and reactive to light. Right eye exhibits no discharge. Left eye exhibits no discharge. No scleral icterus.  Neck:  Normal range of motion.  Cardiovascular: Normal rate and regular rhythm.  Exam reveals no gallop and no friction rub.   No murmur heard. Pulmonary/Chest: Effort normal. No respiratory distress. She has decreased breath sounds. She has no wheezes. She has no rales. She exhibits no tenderness.  Abdominal: Soft. Bowel sounds are normal. She exhibits no distension and no mass. There is no tenderness. There is no rebound and no guarding. No hernia.  Neurological: She is alert and oriented to person, place, and time.  Normal gait  Skin: Skin is warm and dry.  Psychiatric: She has a normal mood and affect. Her behavior is normal.  Nursing note and vitals reviewed.    ED Treatments / Results  Labs (all labs ordered are listed, but only abnormal results are displayed) Labs Reviewed  BASIC METABOLIC PANEL - Abnormal; Notable for  the following:       Result Value   Glucose, Bld 110 (*)    All other components within normal limits  CBC  I-STAT TROPOININ, ED  I-STAT TROPOININ, ED    EKG  EKG Interpretation  Date/Time:  Tuesday July 26 2016 11:23:44 EST Ventricular Rate:  71 PR Interval:  146 QRS Duration: 82 QT Interval:  402 QTC Calculation: 436 R Axis:   49 Text Interpretation:  Normal sinus rhythm Nonspecific T wave abnormality Abnormal ECG Confirmed by Hazle Coca [id number]) on 07/26/2016 11:37:41 AM       Radiology No results found.  Procedures Procedures (including critical care time)  Medications Ordered in ED Medications - No data to display   Initial Impression / Assessment and Plan / ED Course  I have reviewed the triage vital signs and the nursing notes.  Pertinent labs & imaging results that were available during my care of the patient were reviewed by me and considered in my medical decision making (see chart for details).  Clinical Course    50 year old female with atypical CP most likely from panic attack. Chest pain work up is reassuring. EKG is NSR. CXR is  negative. Initial and second troponin is 0. Labs are unremarkable. No significant past or family hx of cardiac disease. HEART score is 3.   Dental pain is most likely cause of her right sided headache and ear pain. No evidence of abscess. Rx for PCN given and advised to take if she develops swelling and to follow up with dentist for definitive care.   Shared visit with Dr. Ralene Bathe. Patient is NAD, non-toxic, with stable VS. Patient is informed of clinical course, understands medical decision making process, and agrees with plan. Opportunity for questions provided and all questions answered. Return precautions given.   Final Clinical Impressions(s) / ED Diagnoses   Final diagnoses:  Atypical chest pain  Pain, dental    New Prescriptions Discharge Medication List as of 07/26/2016  3:21 PM       Recardo Evangelist, PA-C 07/30/16 1116    Quintella Reichert, MD 07/31/16 1515

## 2016-07-26 NOTE — Discharge Instructions (Signed)
Please follow up with your dentist for definitive care of your tooth Take Clindamycin if you develop swelling and worsening pain

## 2016-08-09 ENCOUNTER — Emergency Department (HOSPITAL_COMMUNITY)
Admission: EM | Admit: 2016-08-09 | Discharge: 2016-08-09 | Disposition: A | Discharge status: Home / Self Care | Payer: Medicaid Other | Attending: Emergency Medicine | Admitting: Emergency Medicine

## 2016-08-09 ENCOUNTER — Emergency Department (HOSPITAL_COMMUNITY): Payer: Medicaid Other

## 2016-08-09 ENCOUNTER — Encounter (HOSPITAL_COMMUNITY): Payer: Self-pay | Admitting: Neurology

## 2016-08-09 DIAGNOSIS — Z79899 Other long term (current) drug therapy: Secondary | ICD-10-CM | POA: Diagnosis not present

## 2016-08-09 DIAGNOSIS — F17210 Nicotine dependence, cigarettes, uncomplicated: Secondary | ICD-10-CM | POA: Diagnosis not present

## 2016-08-09 DIAGNOSIS — J069 Acute upper respiratory infection, unspecified: Secondary | ICD-10-CM | POA: Diagnosis not present

## 2016-08-09 DIAGNOSIS — Z7984 Long term (current) use of oral hypoglycemic drugs: Secondary | ICD-10-CM | POA: Insufficient documentation

## 2016-08-09 DIAGNOSIS — E119 Type 2 diabetes mellitus without complications: Secondary | ICD-10-CM | POA: Diagnosis not present

## 2016-08-09 DIAGNOSIS — R059 Cough, unspecified: Secondary | ICD-10-CM

## 2016-08-09 DIAGNOSIS — I10 Essential (primary) hypertension: Secondary | ICD-10-CM | POA: Diagnosis not present

## 2016-08-09 DIAGNOSIS — R040 Epistaxis: Secondary | ICD-10-CM

## 2016-08-09 DIAGNOSIS — R05 Cough: Secondary | ICD-10-CM | POA: Diagnosis present

## 2016-08-09 HISTORY — DX: Calculus of kidney: N20.0

## 2016-08-09 HISTORY — DX: Reserved for inherently not codable concepts without codable children: IMO0001

## 2016-08-09 HISTORY — DX: Dorsalgia, unspecified: M54.9

## 2016-08-09 LAB — BASIC METABOLIC PANEL
ANION GAP: 8 (ref 5–15)
BUN: 11 mg/dL (ref 6–20)
CALCIUM: 9.4 mg/dL (ref 8.9–10.3)
CO2: 26 mmol/L (ref 22–32)
Chloride: 106 mmol/L (ref 101–111)
Creatinine, Ser: 0.83 mg/dL (ref 0.44–1.00)
GLUCOSE: 118 mg/dL — AB (ref 65–99)
POTASSIUM: 4.1 mmol/L (ref 3.5–5.1)
SODIUM: 140 mmol/L (ref 135–145)

## 2016-08-09 LAB — CBC WITH DIFFERENTIAL/PLATELET
BASOS ABS: 0 10*3/uL (ref 0.0–0.1)
BASOS PCT: 0 %
EOS ABS: 0.1 10*3/uL (ref 0.0–0.7)
Eosinophils Relative: 1 %
HEMATOCRIT: 40.6 % (ref 36.0–46.0)
HEMOGLOBIN: 14 g/dL (ref 12.0–15.0)
Lymphocytes Relative: 28 %
Lymphs Abs: 2.8 10*3/uL (ref 0.7–4.0)
MCH: 28.4 pg (ref 26.0–34.0)
MCHC: 34.5 g/dL (ref 30.0–36.0)
MCV: 82.4 fL (ref 78.0–100.0)
MONOS PCT: 8 %
Monocytes Absolute: 0.8 10*3/uL (ref 0.1–1.0)
NEUTROS ABS: 6.2 10*3/uL (ref 1.7–7.7)
NEUTROS PCT: 63 %
Platelets: 315 10*3/uL (ref 150–400)
RBC: 4.93 MIL/uL (ref 3.87–5.11)
RDW: 13.2 % (ref 11.5–15.5)
WBC: 9.9 10*3/uL (ref 4.0–10.5)

## 2016-08-09 LAB — TROPONIN I

## 2016-08-09 LAB — D-DIMER, QUANTITATIVE (NOT AT ARMC): D DIMER QUANT: 0.28 ug{FEU}/mL (ref 0.00–0.50)

## 2016-08-09 MED ORDER — BENZONATATE 100 MG PO CAPS: 100.0000 mg | ORAL_CAPSULE | Freq: Three times a day (TID) | ORAL | 0 refills | Status: DC | PRN

## 2016-08-09 MED ORDER — ALBUTEROL SULFATE HFA 108 (90 BASE) MCG/ACT IN AERS: 2.0000 | INHALATION_SPRAY | RESPIRATORY_TRACT | 0 refills | Status: DC | PRN

## 2016-08-09 MED ORDER — IPRATROPIUM-ALBUTEROL 0.5-2.5 (3) MG/3ML IN SOLN
3.0000 mL | Freq: Once | RESPIRATORY_TRACT | Status: AC
  Administered 2016-08-09: 3 mL via RESPIRATORY_TRACT
  Filled 2016-08-09: qty 3

## 2016-08-09 NOTE — ED Provider Notes (Signed)
[facility] DEPT Provider Note   CSN: [id number] Arrival date & time: 08/09/16  0900     History   Chief Complaint Chief Complaint  Patient presents with  . Hemoptysis  . Chest Pain    HPI Jessica Lawson is a 50 y.o. female.  HPI  Pt was seen at 0915.   Per pt, c/o gradual onset and persistence of constant runny/stuffy nose, sinus congestion, and cough for the past 3 days. States this morning when she was coughing, her nose began to bleed, and then she "was coughing up blood." Has been associated with anterior chest pain and generalized back pain when coughing.  Endorses hx of "DDD in my back" and feels the back pain is similar. Pt has been taking OTC "cold medicine" without improvement in her symptoms. Denies fevers, no rash, no SOB, no N/V/D, no abd pain.    Past Medical History:  Diagnosis Date  . Back pain   . Diabetes mellitus without complication ([place])   . High blood cholesterol   . Hypertension   . Kidney stone   . Normal cardiac stress test 02/2016   low risk  . Obesity   . Pain of cheek    and right side neck  . Tobacco use   . UTI (lower urinary tract infection)   . Wrist pain    left wrist    Patient Active Problem List   Diagnosis Date Noted  . Chest pain 03/05/2016  . Acute bronchitis 03/05/2016  . Diabetes mellitus without complication ([place])   . Tobacco use   . Tobacco dependence 01/12/2016  . Environmental allergies 10/26/2015  . Hypercholesterolemia with hypertriglyceridemia 09/20/2015  . Hyperlipidemia 09/05/2015  . Right shoulder pain 09/05/2015  . Mouth ulcers 09/05/2015    Past Surgical History:  Procedure Laterality Date  . CESAREAN SECTION     3 c-sections    OB History    No data available       Home Medications    Prior to Admission medications   Medication Sig Start Date End Date Taking? Authorizing Provider  acetaminophen (TYLENOL) 325 MG tablet Take 2 tablets (650 mg total) by mouth every 6 (six) hours as needed.  06/23/16   Merrily Pew, MD  clindamycin (CLEOCIN) 150 MG capsule Take 1 capsule (150 mg total) by mouth 3 (three) times daily. 07/26/16   Recardo Evangelist, PA-C  loratadine (CLARITIN) 10 MG tablet Take 10 mg by mouth daily.    Historical Provider, MD  metFORMIN (GLUCOPHAGE) 500 MG tablet Take 1 tablet (500 mg total) by mouth 2 (two) times daily with a meal. Reported on 09/03/2015 04/20/16   Micheline Chapman, NP  Multiple Vitamins-Minerals (MULTIVITAMIN WITH MINERALS) tablet Take 1 tablet by mouth daily.    Historical Provider, MD  Omega-3 Fatty Acids (FISH OIL) 1000 MG CPDR Take 1,000 mg by mouth daily.    Historical Provider, MD  pravastatin (PRAVACHOL) 40 MG tablet Take 1 tablet (40 mg total) by mouth daily. 04/20/16   Micheline Chapman, NP    Family History Family History  Problem Relation Age of Onset  . Hyperlipidemia Other   . Hypertension Other   . Diabetes Other   . Diabetes Mother   . Lung disease Father   . Diabetes Sister   . Diabetes Sister   . Hypertension Sister     Social History Social History  Substance Use Topics  . Smoking status: Current Every Day Smoker    Packs/day: 1.00  Types: Cigarettes  . Smokeless tobacco: Never Used  . Alcohol use No     Allergies   Asa [aspirin]; Iohexol; Penicillins; Sulfa antibiotics; and Statins   Review of Systems Review of Systems ROS: Statement: All systems negative except as marked or noted in the HPI; Constitutional: Negative for fever and chills. ; ; Eyes: Negative for eye pain, redness and discharge. ; ; ENMT: +nosebleed. Negative for ear pain, hoarseness, nasal congestion, sinus pressure and sore throat. ; ; Cardiovascular: Negative for palpitations, diaphoresis, dyspnea and peripheral edema. ; ; Respiratory: +cough. Negative for wheezing and stridor. ; ; Gastrointestinal: Negative for nausea, vomiting, diarrhea, abdominal pain, blood in stool, hematemesis, jaundice and rectal bleeding. . ; ; Genitourinary: Negative for  dysuria, flank pain and hematuria. ; ; Musculoskeletal: +chest wall pain, back pain. Negative for neck pain. Negative for swelling and trauma.; ; Skin: Negative for pruritus, rash, abrasions, blisters, bruising and skin lesion.; ; Neuro: Negative for headache, lightheadedness and neck stiffness. Negative for weakness, altered level of consciousness, altered mental status, extremity weakness, paresthesias, involuntary movement, seizure and syncope.       Physical Exam Updated Vital Signs BP 123/63   Pulse 76   Temp 98.5 F (36.9 C) (Oral)   Resp 16   Ht 4\' 11"  (1.499 m)   Wt 162 lb (73.5 kg)   LMP 08/22/2011   SpO2 100%   BMI 32.72 kg/m   Physical Exam 0920: Physical examination:  Nursing notes reviewed; Vital signs and O2 SAT reviewed;  Constitutional: Well developed, Well nourished, Well hydrated, In no acute distress; Head:  Normocephalic, atraumatic; Eyes: EOMI, PERRL, No scleral icterus; ENMT: TM's clear bilat. +edemetous and friable nasal turbinates bilat with clear rhinorrhea. Dried blood in nares, no active epistaxis. Mouth and pharynx without lesions. No tonsillar exudates. No intra-oral edema. No submandibular or sublingual edema. No hoarse voice, no drooling, no stridor. No pain with manipulation of larynx. No trismus. Mouth and pharynx normal, Mucous membranes moist; Neck: Supple, Full range of motion, No lymphadenopathy; Cardiovascular: Regular rate and rhythm, No gallop; Respiratory: Breath sounds clear & equal bilaterally, No wheezes.  Speaking full sentences with ease, Normal respiratory effort/excursion; Chest: Nontender, Movement normal; Abdomen: Soft, Nontender, Nondistended, Normal bowel sounds; Genitourinary: No CVA tenderness; Extremities: Pulses normal, No tenderness, No edema, No calf edema or asymmetry.; Neuro: AA&Ox3, Major CN grossly intact.  Speech clear. No gross focal motor or sensory deficits in extremities.; Skin: Color normal, Warm, Dry.   ED Treatments /  Results  Labs (all labs ordered are listed, but only abnormal results are displayed)   EKG  EKG Interpretation  Date/Time:  Tuesday August 09 2016 09:13:24 EST Ventricular Rate:  80 PR Interval:  126 QRS Duration: 84 QT Interval:  392 QTC Calculation: 452 R Axis:   67 Text Interpretation:  Normal sinus rhythm Nonspecific T wave abnormality When compared with ECG of 07/26/2016 No significant change was found Confirmed by [facility]  MD, Nunzio Cory [phone]) on 08/09/2016 9:32:18 AM       Radiology   Procedures Procedures (including critical care time)  Medications Ordered in ED Medications  ipratropium-albuterol (DUONEB) 0.5-2.5 (3) MG/3ML nebulizer solution 3 mL (not administered)     Initial Impression / Assessment and Plan / ED Course  I have reviewed the triage vital signs and the nursing notes.  Pertinent labs & imaging results that were available during my care of the patient were reviewed by me and considered in my medical decision making (see chart for  details).  MDM Reviewed: previous chart, nursing note and vitals Reviewed previous: labs and ECG Interpretation: labs, ECG and x-ray   Results for orders placed or performed during the hospital encounter of [id number]  Basic metabolic panel  Result Value Ref Range   Sodium 140 135 - 145 mmol/L   Potassium 4.1 3.5 - 5.1 mmol/L   Chloride 106 101 - 111 mmol/L   CO2 26 22 - 32 mmol/L   Glucose, Bld 118 (H) 65 - 99 mg/dL   BUN 11 6 - 20 mg/dL   Creatinine, Ser 0.83 0.44 - 1.00 mg/dL   Calcium 9.4 8.9 - 10.3 mg/dL   GFR calc non Af Amer >60 >60 mL/min   GFR calc Af Amer >60 >60 mL/min   Anion gap 8 5 - 15  D-dimer, quantitative  Result Value Ref Range   D-Dimer, Quant 0.28 0.00 - 0.50 ug/mL-FEU  CBC with Differential  Result Value Ref Range   WBC 9.9 4.0 - 10.5 K/uL   RBC 4.93 3.87 - 5.11 MIL/uL   Hemoglobin 14.0 12.0 - 15.0 g/dL   HCT 40.6 36.0 - 46.0 %   MCV 82.4 78.0 - 100.0 fL   MCH 28.4 26.0 - 34.0 pg     MCHC 34.5 30.0 - 36.0 g/dL   RDW 13.2 11.5 - 15.5 %   Platelets 315 150 - 400 K/uL   Neutrophils Relative % 63 %   Neutro Abs 6.2 1.7 - 7.7 K/uL   Lymphocytes Relative 28 %   Lymphs Abs 2.8 0.7 - 4.0 K/uL   Monocytes Relative 8 %   Monocytes Absolute 0.8 0.1 - 1.0 K/uL   Eosinophils Relative 1 %   Eosinophils Absolute 0.1 0.0 - 0.7 K/uL   Basophils Relative 0 %   Basophils Absolute 0.0 0.0 - 0.1 K/uL  Troponin I  Result Value Ref Range   Troponin I <0.03 <0.03 ng/mL  Troponin I  Result Value Ref Range   Troponin I <0.03 <0.03 ng/mL   Dg Chest 2 View Result Date: 08/09/2016 CLINICAL DATA:  Productive cough for 4 days. EXAM: CHEST  2 VIEW COMPARISON:  PA and lateral chest 07/26/2016 and 08/19/2006. FINDINGS: Lungs are clear. Heart size is normal. No pneumothorax or pleural effusion. No bony abnormality. IMPRESSION: Negative chest. Electronically Signed   By: Inge Rise M.D.   On: 08/09/2016 09:47    1330: Doubt true hemoptysis, as pt was having a nosebleed and "coughing up blood" at the same time (likely post-nasal drip source of blood while coughing).  Doubt PE as cause for symptoms with normal d-dimer and low risk [place].  Doubt ACS as cause for symptoms with normal troponin x2 and unchanged EKG from previous, as well as low risk cardiac stress several months ago. Short neb given, pt feels "better." No epistaxis in ED. Has tol PO well while in the ED without N/V. Pt states she wants to go home now. Tx symptomatically at this time. Dx and testing d/w pt.  Questions answered.  Verb understanding, agreeable to d/c home with outpt f/u.    Final Clinical Impressions(s) / ED Diagnoses   Final diagnoses:  None    New Prescriptions New Prescriptions   No medications on file     Francine Graven, DO 08/12/16 1821

## 2016-08-09 NOTE — ED Notes (Signed)
Pt reports chest soreness with coughing

## 2016-08-09 NOTE — ED Notes (Signed)
Awaiting blood work results, pt notified

## 2016-08-09 NOTE — Discharge Instructions (Signed)
Take over the counter decongestant (safe for high blood pressure), as directed on packaging, for the next week.  Use over the counter normal saline nasal spray, as instructed in the Emergency Department, several times per day for the next 2 weeks.  Take the prescriptions as directed.  Use your albuterol inhaler (2 to 4 puffs) every 4 hours for the next 7 days, then as needed for cough, wheezing, or shortness of breath.  Take over the counter tylenol, as directed on packaging, as needed for discomfort. Call your regular medical doctor today to schedule a follow up appointment within the next 3 days.  Return to the Emergency Department immediately sooner if worsening.

## 2016-08-09 NOTE — ED Notes (Signed)
Patient transported to X-ray 

## 2016-08-09 NOTE — ED Notes (Signed)
Wait till noon on second troponin

## 2016-08-09 NOTE — ED Triage Notes (Addendum)
Pt reports has been having cp, cough and this morning started coughing up blood and having a nosebleed. Is concerned about her cp b/c she is a "heart" patient and pain going into her back.

## 2016-08-22 ENCOUNTER — Ambulatory Visit (INDEPENDENT_AMBULATORY_CARE_PROVIDER_SITE_OTHER): Payer: Medicaid Other | Admitting: Family Medicine

## 2016-08-22 ENCOUNTER — Encounter: Payer: Self-pay | Admitting: Family Medicine

## 2016-08-22 VITALS — BP 134/81 | HR 70 | Temp 97.8°F | Resp 16 | Ht 59.0 in | Wt 165.0 lb

## 2016-08-22 DIAGNOSIS — E119 Type 2 diabetes mellitus without complications: Secondary | ICD-10-CM

## 2016-08-22 DIAGNOSIS — E785 Hyperlipidemia, unspecified: Secondary | ICD-10-CM

## 2016-08-22 DIAGNOSIS — K5900 Constipation, unspecified: Secondary | ICD-10-CM | POA: Diagnosis not present

## 2016-08-22 DIAGNOSIS — G629 Polyneuropathy, unspecified: Secondary | ICD-10-CM

## 2016-08-22 LAB — LIPID PANEL
CHOL/HDL RATIO: 8.5 ratio — AB (ref <5.0)
Cholesterol: 332 mg/dL — ABNORMAL HIGH (ref <200)
HDL: 39 mg/dL — AB (ref >50)
LDL CALC: 242 mg/dL — AB (ref <100)
TRIGLYCERIDES: 257 mg/dL — AB (ref <150)
VLDL: 51 mg/dL — ABNORMAL HIGH (ref <30)

## 2016-08-22 LAB — POCT GLYCOSYLATED HEMOGLOBIN (HGB A1C): Hemoglobin A1C: 5.9

## 2016-08-22 MED ORDER — GABAPENTIN 100 MG PO CAPS: 100.0000 mg | ORAL_CAPSULE | Freq: Two times a day (BID) | ORAL | 2 refills | Status: DC

## 2016-08-22 MED ORDER — DOCUSATE SODIUM 100 MG PO CAPS: 100.0000 mg | ORAL_CAPSULE | Freq: Every day | ORAL | 1 refills | Status: DC

## 2016-08-22 NOTE — Progress Notes (Signed)
Subjective:    Patient ID: Jessica Lawson, female    DOB: [birth year], 51 y.o.   MRN: [id number]  HPI [name], a 51 year old female that presents for a follow up of hyperlipidemia and pre-diabetes. Jessica Lawson states that she has been taking Pravastatin 40 mg daily. . She says that she has not been following a low fat, carbohydrate diet.   She also has a history of prediabetes. Previous hemoglobin a1c was 6.0,  patient has been unable to take Metformin 500 mg BID due to running out of medication. She maintains that she has been primarily taking medication at night. Patient endorses paraesthesias to lower extremities.     Patient denies foot ulcerations, increase appetite, nausea, polydipsia, polyuria, visual disturbances and weight loss.  Evaluation to date has been included: hemoglobin A1C.   Past Medical History:  Diagnosis Date  . Back pain   . Diabetes mellitus without complication ([place])   . High blood cholesterol   . Hypertension   . Kidney stone   . Normal cardiac stress test 02/2016   low risk  . Obesity   . Pain of cheek    and right side neck  . Tobacco use   . UTI (lower urinary tract infection)   . Wrist pain    left wrist   Allergies  Allergen Reactions  . Asa [Aspirin] Anaphylaxis, Hives and Swelling  . Iohexol Other (See Comments)     Desc: pt complains of difficulty swallowing and thickened tongue/ throat closes up   . Penicillins Hives, Swelling and Other (See Comments)    Thrush   . Sulfa Antibiotics Anaphylaxis, Hives, Swelling and Other (See Comments)  . Statins Hives, Swelling and Other (See Comments)    Thrush    Immunization History  Administered Date(s) Administered  . Pneumococcal Polysaccharide-23 09/03/2015   Past Surgical History:  Procedure Laterality Date  . CESAREAN SECTION     3 c-sections   Review of Systems  Constitutional: Negative.  Negative for fever and unexpected weight change.  Eyes: Negative.   Respiratory:  Negative.   Cardiovascular: Negative.  Negative for palpitations and leg swelling.  Gastrointestinal: Positive for constipation.  Endocrine: Negative.  Negative for polydipsia, polyphagia and polyuria.  Genitourinary: Negative.   Musculoskeletal: Negative.   Skin: Negative.   Allergic/Immunologic: Negative.   Neurological: Positive for numbness.  Hematological: Negative.   Psychiatric/Behavioral: Negative.       Objective:   Physical Exam  Constitutional: She is oriented to person, place, and time. She appears well-developed and well-nourished.  HENT:  Head: Normocephalic and atraumatic.  Right Ear: External ear normal.  Left Ear: External ear normal.  Mouth/Throat: Oropharynx is clear and moist.  Eyes: Conjunctivae and EOM are normal. Pupils are equal, round, and reactive to light.  Neck: Normal range of motion. Neck supple.  Cardiovascular: Normal rate, normal heart sounds and intact distal pulses.   Pulmonary/Chest: Effort normal and breath sounds normal.  Abdominal: Soft. Bowel sounds are normal.  Musculoskeletal: Normal range of motion.  Neurological: She is alert and oriented to person, place, and time. She has normal reflexes.  Skin: Skin is warm and dry.  Psychiatric: She has a normal mood and affect. Her behavior is normal. Judgment and thought content normal.       BP 134/81 (BP Location: Left Arm, Patient Position: Sitting, Cuff Size: Normal)   Pulse 70   Temp 97.8 F (36.6 C) (Oral)   Resp 16   Ht 4'  11" (1.499 m)   Wt 165 lb (74.8 kg)   LMP 08/22/2011   SpO2 100%   BMI 33.33 kg/m  Assessment & Plan:   1. Hyperlipidemia, unspecified hyperlipidemia type - Lipid Panel  2. Diabetes mellitus without complication ([place]) Recommend a lowfat, low carbohydrate diet divided over 5-6 small meals, increase water intake to 6-8 glasses, and 150 minutes per week of cardiovascular exercise.   - HgB A1c  3. Constipation, unspecified constipation type  - docusate  sodium (COLACE) 100 MG capsule; Take 1 capsule (100 mg total) by mouth daily.  Dispense: 30 capsule; Refill: 1  5. Neuropathy ([place]) Will start a trial of Gabapentin for neuropathy - gabapentin (NEURONTIN) 100 MG capsule; Take 1 capsule (100 mg total) by mouth 2 (two) times daily.  Dispense: 60 capsule; Refill: 2    Routine Health Maintenance:  Discussed previous laboratory results at length Will repeat pap smear in 3 months Previously sent referral for mammogram Will need an opthalmology evaluation.    RTC: 3 months for follow up of hyperlipidemia and prediabetes  Teren Zurcher M, FNP    The patient was given clear instructions to go to ER or return to medical center if symptoms do not improve, worsen or new problems develop. The patient verbalized understanding. Will notify patient with laboratory results.

## 2016-08-22 NOTE — Patient Instructions (Addendum)
Diabetes and Foot Care Diabetes may cause you to have problems because of poor blood supply (circulation) to your feet and legs. This may cause the skin on your feet to become thinner, break easier, and heal more slowly. Your skin may become dry, and the skin may peel and crack. You may also have nerve damage in your legs and feet causing decreased feeling in them. You may not notice minor injuries to your feet that could lead to infections or more serious problems. Taking care of your feet is one of the most important things you can do for yourself. Follow these instructions at home:  Wear shoes at all times, even in the house. Do not go barefoot. Bare feet are easily injured.  Check your feet daily for blisters, cuts, and redness. If you cannot see the bottom of your feet, use a mirror or ask someone for help.  Wash your feet with warm water (do not use hot water) and mild soap. Then pat your feet and the areas between your toes until they are completely dry. Do not soak your feet as this can dry your skin.  Apply a moisturizing lotion or petroleum jelly (that does not contain alcohol and is unscented) to the skin on your feet and to dry, brittle toenails. Do not apply lotion between your toes.  Trim your toenails straight across. Do not dig under them or around the cuticle. File the edges of your nails with an emery board or nail file.  Do not cut corns or calluses or try to remove them with medicine.  Wear clean socks or stockings every day. Make sure they are not too tight. Do not wear knee-high stockings since they may decrease blood flow to your legs.  Wear shoes that fit properly and have enough cushioning. To break in new shoes, wear them for just a few hours a day. This prevents you from injuring your feet. Always look in your shoes before you put them on to be sure there are no objects inside.  Do not cross your legs. This may decrease the blood flow to your feet.  If you find a  minor scrape, cut, or break in the skin on your feet, keep it and the skin around it clean and dry. These areas may be cleansed with mild soap and water. Do not cleanse the area with peroxide, alcohol, or iodine.  When you remove an adhesive bandage, be sure not to damage the skin around it.  If you have a wound, look at it several times a day to make sure it is healing.  Do not use heating pads or hot water bottles. They may burn your skin. If you have lost feeling in your feet or legs, you may not know it is happening until it is too late.  Make sure your health care provider performs a complete foot exam at least annually or more often if you have foot problems. Report any cuts, sores, or bruises to your health care provider immediately. Contact a health care provider if:  You have an injury that is not healing.  You have cuts or breaks in the skin.  You have an ingrown nail.  You notice redness on your legs or feet.  You feel burning or tingling in your legs or feet.  You have pain or cramps in your legs and feet.  Your legs or feet are numb.  Your feet always feel cold. Get help right away if:  There is increasing   redness, swelling, or pain in or around a wound.  There is a red line that goes up your leg.  Pus is coming from a wound.  You develop a fever or as directed by your health care provider.  You notice a bad smell coming from an ulcer or wound. This information is not intended to replace advice given to you by your health care provider. Make sure you discuss any questions you have with your health care provider. Document Released: 07/29/2000 Document Revised: 01/07/2016 Document Reviewed: 01/08/2013 Elsevier Interactive Patient Education  2017 [employer]  Dyslipidemia Dyslipidemia is an imbalance of waxy, fat-like substances (lipids) in the blood. The body needs lipids in small amounts. Dyslipidemia often involves a high level of cholesterol or  triglycerides, which are types of lipids. Common forms of dyslipidemia include:  High levels of bad cholesterol (LDL cholesterol). LDL is the type of cholesterol that causes fatty deposits (plaques) to build up in the blood vessels that carry blood away from your heart (arteries).  Low levels of good cholesterol (HDL cholesterol). HDL cholesterol is the type of cholesterol that protects against heart disease. High levels of HDL remove the LDL buildup from arteries.  High levels of triglycerides. Triglycerides are a fatty substance in the blood that is linked to a buildup of plaques in the arteries. You can develop dyslipidemia because of the genes you are born with (primary dyslipidemia) or changes that occur during your life (secondary dyslipidemia), or as a side effect of certain medical treatments. What are the causes? Primary dyslipidemia is caused by changes (mutations) in genes that are passed down through families (inherited). These mutations cause several types of dyslipidemia. Mutations can result in disorders that make the body produce too much LDL cholesterol or triglycerides, or not enough HDL cholesterol. These disorders may lead to heart disease, arterial disease, or stroke at an early age. Causes of secondary dyslipidemia include certain lifestyle choices and diseases that lead to dyslipidemia, such as:  Eating a diet that is high in animal fat.  Not getting enough activity or exercise (having a sedentary lifestyle).  Having diabetes, kidney disease, liver disease, or thyroid disease.  Drinking large amounts of alcohol.  Using certain types of drugs. What increases the risk? You may be at greater risk for dyslipidemia if you are an older man or if you are a woman who has gone through menopause. Other risk factors include:  Having a family history of dyslipidemia.  Taking certain medicines, including birth control pills, steroids, some diuretics, beta-blockers, and some  medicines forHIV.  Smoking cigarettes.  Eating a high-fat diet.  Drinking large amounts of alcohol.  Having certain medical conditions such as diabetes, polycystic ovary syndrome (PCOS), pregnancy, kidney disease, liver disease, or hypothyroidism.  Not exercising regularly.  Being overweight or obese with too much belly fat. What are the signs or symptoms? Dyslipidemia does not usually cause any symptoms. Very high lipid levels can cause fatty bumps under the skin (xanthomas) or a white or gray ring around the black center (pupil) of the eye. Very high triglyceride levels can cause inflammation of the pancreas (pancreatitis). How is this diagnosed? Your health care provider may diagnose dyslipidemia based on a routine blood test (fasting blood test). Because most people do not have symptoms of the condition, this blood testing (lipid profile) is done on adults age 72 and older and is repeated every 5 years. This test checks:  Total cholesterol. This is a measure of the total amount of  cholesterol in your blood, including LDL cholesterol, HDL cholesterol, and triglycerides. A healthy number is below 200.  LDL cholesterol. The target number for LDL cholesterol is different for each person, depending on individual risk factors. For most people, a number below 100 is healthy. Ask your health care provider what your LDL cholesterol number should be.  HDL cholesterol. An HDL level of 60 or higher is best because it helps to protect against heart disease. A number below 38 for men or below 64 for women increases the risk for heart disease.  Triglycerides. A healthy triglyceride number is below 150. If your lipid profile is abnormal, your health care provider may do other blood tests to get more information about your condition. How is this treated? Treatment depends on the type of dyslipidemia that you have and your other risk factors for heart disease and stroke. Your health care provider will  have a target range for your lipid levels based on this information. For many people, treatment starts with lifestyle changes, such as diet and exercise. Your health care provider may recommend that you:  Get regular exercise.  Make changes to your diet.  Quit smoking if you smoke. If diet changes and exercise do not help you reach your goals, your health care provider may also prescribe medicine to lower lipids. The most commonly prescribed type of medicine lowers your LDL cholesterol (statin drug). If you have a high triglyceride level, your provider may prescribe another type of drug (fibrate) or an omega-3 fish oil supplement, or both. Follow these instructions at home:  Take over-the-counter and prescription medicines only as told by your health care provider. This includes supplements.  Get regular exercise. Start an aerobic exercise and strength training program as told by your health care provider. Ask your health care provider what activities are safe for you. Your health care provider may recommend:  30 minutes of aerobic activity 4-6 days a week. Brisk walking is an example of aerobic activity.  Strength training 2 days a week.  Eat a healthy diet as told by your health care provider. This can help you reach and maintain a healthy weight, lower your LDL cholesterol, and raise your HDL cholesterol. It may help to work with a diet and nutrition specialist (dietitian) to make a plan that is right for you. Your dietitian or health care provider may recommend:  Limiting your calories, if you are overweight.  Eating more fruits, vegetables, whole grains, fish, and lean meats.  Limiting saturated fat, trans fat, and cholesterol.  Follow instructions from your health care provider or dietitian about eating or drinking restrictions.  Limit alcohol intake to no more than one drink per day for nonpregnant women and two drinks per day for men. One drink equals 12 oz of beer, 5 oz of wine,  or 1 oz of hard liquor.  Do not use any products that contain nicotine or tobacco, such as cigarettes and e-cigarettes. If you need help quitting, ask your health care provider.  Keep all follow-up visits as told by your health care provider. This is important. Contact a health care provider if:  You are having trouble sticking to your exercise or diet plan.  You are struggling to quit smoking or control your use of alcohol. Summary  Dyslipidemia is an imbalance of waxy, fat-like substances (lipids) in the blood. The body needs lipids in small amounts. Dyslipidemia often involves a high level of cholesterol or triglycerides, which are types of lipids.  Treatment depends  on the type of dyslipidemia that you have and your other risk factors for heart disease and stroke.  For many people, treatment starts with lifestyle changes, such as diet and exercise. Your health care provider may also prescribe medicine to lower lipids. This information is not intended to replace advice given to you by your health care provider. Make sure you discuss any questions you have with your health care provider. Document Released: 08/06/2013 Document Revised: 03/28/2016 Document Reviewed: 03/28/2016 Elsevier Interactive Patient Education  2017 [employer].

## 2016-08-23 LAB — POCT URINALYSIS DIP (DEVICE)
BILIRUBIN URINE: NEGATIVE
Glucose, UA: NEGATIVE mg/dL
Ketones, ur: NEGATIVE mg/dL
Leukocytes, UA: NEGATIVE
NITRITE: NEGATIVE
PH: 5.5 (ref 5.0–8.0)
PROTEIN: NEGATIVE mg/dL
Specific Gravity, Urine: 1.025 (ref 1.005–1.030)
Urobilinogen, UA: 0.2 mg/dL (ref 0.0–1.0)

## 2016-08-24 ENCOUNTER — Other Ambulatory Visit: Payer: Self-pay | Admitting: Family Medicine

## 2016-08-24 DIAGNOSIS — E785 Hyperlipidemia, unspecified: Secondary | ICD-10-CM

## 2016-08-24 MED ORDER — PRAVASTATIN SODIUM 80 MG PO TABS: 40.0000 mg | ORAL_TABLET | Freq: Every day | ORAL | 1 refills | Status: DC

## 2016-08-24 NOTE — Progress Notes (Signed)
Called and spoke with patient, advised to increase cholesterol medication to 80mg  daily and eat low cholesterol diet. Asked patient to keep follow up appointment. Thanks!

## 2016-10-31 ENCOUNTER — Ambulatory Visit (INDEPENDENT_AMBULATORY_CARE_PROVIDER_SITE_OTHER): Payer: Medicaid Other | Admitting: Family Medicine

## 2016-10-31 ENCOUNTER — Encounter: Payer: Self-pay | Admitting: Family Medicine

## 2016-10-31 VITALS — BP 136/64 | HR 83 | Temp 97.8°F | Resp 16 | Ht 59.0 in | Wt 171.0 lb

## 2016-10-31 DIAGNOSIS — L298 Other pruritus: Secondary | ICD-10-CM | POA: Diagnosis not present

## 2016-10-31 DIAGNOSIS — B373 Candidiasis of vulva and vagina: Secondary | ICD-10-CM | POA: Insufficient documentation

## 2016-10-31 DIAGNOSIS — E119 Type 2 diabetes mellitus without complications: Secondary | ICD-10-CM | POA: Diagnosis not present

## 2016-10-31 DIAGNOSIS — B3731 Acute candidiasis of vulva and vagina: Secondary | ICD-10-CM

## 2016-10-31 DIAGNOSIS — N898 Other specified noninflammatory disorders of vagina: Secondary | ICD-10-CM | POA: Diagnosis not present

## 2016-10-31 LAB — POCT URINALYSIS DIP (DEVICE)
Bilirubin Urine: NEGATIVE
GLUCOSE, UA: NEGATIVE mg/dL
KETONES UR: NEGATIVE mg/dL
Leukocytes, UA: NEGATIVE
Nitrite: NEGATIVE
PH: 5.5 (ref 5.0–8.0)
Protein, ur: NEGATIVE mg/dL
Urobilinogen, UA: 0.2 mg/dL (ref 0.0–1.0)

## 2016-10-31 LAB — POCT WET PREP (WET MOUNT)
CLUE CELLS WET PREP WHIFF POC: NEGATIVE
Trichomonas Wet Prep HPF POC: ABSENT

## 2016-10-31 LAB — POCT GLYCOSYLATED HEMOGLOBIN (HGB A1C): HEMOGLOBIN A1C: 5.9

## 2016-10-31 MED ORDER — MICONAZOLE NITRATE 2 % VA CREA: 1.0000 | TOPICAL_CREAM | Freq: Every day | VAGINAL | 0 refills | Status: DC

## 2016-10-31 MED ORDER — FLUCONAZOLE 150 MG PO TABS: 150.0000 mg | ORAL_TABLET | Freq: Once | ORAL | 0 refills | Status: AC

## 2016-10-31 NOTE — Progress Notes (Signed)
Subjective:    Patient ID: Jessica Lawson, female    DOB: Jan 14, 1966, 51 y.o.   MRN: [id number]  Vaginal Itching  The patient's primary symptoms include genital itching and a genital odor. The patient's pertinent negatives include no genital lesions, genital rash, missed menses, pelvic pain, vaginal bleeding or vaginal discharge. This is a new problem. The current episode started in the past 7 days. The problem occurs constantly. The problem has been unchanged. The patient is experiencing no pain. She is not pregnant. Pertinent negatives include no abdominal pain, anorexia, back pain, chills, constipation, diarrhea, discolored urine, dysuria, fever, flank pain, frequency, headaches, hematuria, joint pain, joint swelling, nausea, painful intercourse, rash, sore throat, urgency or vomiting. Nothing aggravates the symptoms. She is sexually active. No, her partner does not have an STD. She uses nothing for contraception. Her menstrual history has been regular. There is no history of an abdominal surgery, a Cesarean section, a gynecological surgery, herpes simplex, menorrhagia, metrorrhagia, miscarriage, ovarian cysts, perineal abscess, PID, an STD, a terminated pregnancy or vaginosis.   Past Medical History:  Diagnosis Date  . Back pain   . Diabetes mellitus without complication ([place])   . High blood cholesterol   . Hypertension   . Kidney stone   . Normal cardiac stress test 02/2016   low risk  . Obesity   . Pain of cheek    and right side neck  . Tobacco use   . UTI (lower urinary tract infection)   . Wrist pain    left wrist   Social History   Social History  . Marital status: Single    Spouse name: N/A  . Number of children: N/A  . Years of education: N/A   Occupational History  . Not on file.   Social History Main Topics  . Smoking status: Former Smoker    Packs/day: 1.00    Types: Cigarettes    Quit date: 07/26/2016  . Smokeless tobacco: Never Used  . Alcohol use No  .  Drug use: No  . Sexual activity: Not on file   Other Topics Concern  . Not on file   Social History Narrative  . No narrative on file   Allergies  Allergen Reactions  . Asa [Aspirin] Anaphylaxis, Hives and Swelling  . Iohexol Other (See Comments)     Desc: pt complains of difficulty swallowing and thickened tongue/ throat closes up   . Penicillins Hives, Swelling and Other (See Comments)    Thrush   . Sulfa Antibiotics Anaphylaxis, Hives, Swelling and Other (See Comments)     Review of Systems  Constitutional: Negative for chills and fever.  HENT: Negative for sore throat.   Gastrointestinal: Negative for abdominal pain, anorexia, constipation, diarrhea, nausea and vomiting.  Genitourinary: Negative for dysuria, flank pain, frequency, hematuria, menorrhagia, missed menses, pelvic pain, urgency and vaginal discharge.  Musculoskeletal: Negative for back pain and joint pain.  Skin: Negative for rash.  Neurological: Negative for headaches.       Objective:   Physical Exam  Constitutional: She appears well-developed and well-nourished.  HENT:  Head: Normocephalic and atraumatic.  Right Ear: External ear normal.  Left Ear: External ear normal.  Mouth/Throat: Oropharynx is clear and moist.  Eyes: Conjunctivae and EOM are normal. Pupils are equal, round, and reactive to light.  Neck: Normal range of motion.  Cardiovascular: Normal rate, regular rhythm and normal heart sounds.   Pulmonary/Chest: Effort normal.  Abdominal: Soft. Bowel sounds are normal.  Genitourinary: There  is no rash or tenderness on the right labia. There is no rash or tenderness on the left labia. Vaginal discharge (white) found.  Skin: Skin is warm and dry.      BP 136/64 (BP Location: Left Arm, Patient Position: Sitting, Cuff Size: Large)   Pulse 83   Temp 97.8 F (36.6 C) (Oral)   Resp 16   Ht 4\' 11"  (1.499 m)   Wt 171 lb (77.6 kg)   LMP 08/22/2011   SpO2 98%   BMI 34.54 kg/m  Assessment &  Plan:  1. Vaginal itching 3-5 yeast per HPF - POCT Wet Prep [facility])  2. Vaginal yeast infection - fluconazole (DIFLUCAN) 150 MG tablet; Take 1 tablet (150 mg total) by mouth once.  Dispense: 2 tablet; Refill: 0 - miconazole (MONISTAT 7 SIMPLY CURE) 2 % vaginal cream; Place 1 Applicatorful vaginally at bedtime.  Dispense: 45 g; Refill: 0  3. Vaginal discharge - POCT Wet Prep [facility])  4. Diabetes mellitus without complication ([place]) Reviewed hemoglobin a1C 5.9, will continue Metformin 500 mg BID. Recommend a lowfat, low carbohydrate diet divided over 5-6 small meals, increase water intake to 6-8 glasses, and 150 minutes per week of cardiovascular exercise.   - HgB A1c   RTC: As previously scheduled   [name] OCN, MSN, FNP-C   The patient was given clear instructions to go to ER or return to medical center if symptoms do not improve, worsen or new problems develop. The patient verbalized understanding.

## 2016-11-21 ENCOUNTER — Encounter: Payer: Self-pay | Admitting: Family Medicine

## 2016-11-21 ENCOUNTER — Ambulatory Visit (INDEPENDENT_AMBULATORY_CARE_PROVIDER_SITE_OTHER): Payer: Medicaid Other | Admitting: Family Medicine

## 2016-11-21 VITALS — BP 135/55 | HR 62 | Temp 98.2°F | Resp 16 | Ht 59.0 in | Wt 174.0 lb

## 2016-11-21 DIAGNOSIS — E785 Hyperlipidemia, unspecified: Secondary | ICD-10-CM | POA: Diagnosis not present

## 2016-11-21 DIAGNOSIS — F17200 Nicotine dependence, unspecified, uncomplicated: Secondary | ICD-10-CM | POA: Diagnosis not present

## 2016-11-21 DIAGNOSIS — Z9109 Other allergy status, other than to drugs and biological substances: Secondary | ICD-10-CM | POA: Diagnosis not present

## 2016-11-21 LAB — LIPID PANEL
CHOLESTEROL: 356 mg/dL — AB (ref <200)
HDL: 38 mg/dL — AB (ref >50)
LDL CALC: 254 mg/dL — AB (ref <100)
TRIGLYCERIDES: 319 mg/dL — AB (ref <150)
Total CHOL/HDL Ratio: 9.4 Ratio — ABNORMAL HIGH (ref <5.0)
VLDL: 64 mg/dL — ABNORMAL HIGH (ref <30)

## 2016-11-21 MED ORDER — FISH OIL 1000 MG PO CPDR: 1000.0000 mg | DELAYED_RELEASE_CAPSULE | Freq: Every day | ORAL | 11 refills | Status: DC

## 2016-11-21 MED ORDER — LEVOCETIRIZINE DIHYDROCHLORIDE 5 MG PO TABS: 5.0000 mg | ORAL_TABLET | Freq: Every evening | ORAL | 11 refills | Status: DC

## 2016-11-21 MED ORDER — PRAVASTATIN SODIUM 80 MG PO TABS: 80.0000 mg | ORAL_TABLET | Freq: Every day | ORAL | 2 refills | Status: DC

## 2016-11-21 MED ORDER — OLOPATADINE HCL 0.2 % OP SOLN: 1.0000 [drp] | Freq: Every day | OPHTHALMIC | 1 refills | Status: DC

## 2016-11-21 NOTE — Progress Notes (Signed)
Subjective:    Patient ID: Jessica Lawson, female    DOB: 10-Nov-1965, 51 y.o.   MRN: [id number]  Hyperlipidemia  This is a chronic problem. She has no history of chronic renal disease, hypothyroidism, liver disease, obesity or nephrotic syndrome. Compliance problems include adherence to exercise and adherence to diet.  Risk factors for coronary artery disease include dyslipidemia, obesity, post-menopausal and a sedentary lifestyle.  Eye Problem   Both (Eye itching) eyes are affected.This is a recurrent (Patient has a history of allergic rhinitis. She has been taking Claritin 10 mg daily without sustained relief. ) problem. The problem occurs daily. She does not wear contacts. Associated symptoms include eye redness and itching. She has tried nothing for the symptoms.   Past Medical History:  Diagnosis Date  . Back pain   . Diabetes mellitus without complication ([place])   . High blood cholesterol   . Hypertension   . Kidney stone   . Normal cardiac stress test 02/2016   low risk  . Obesity   . Pain of cheek    and right side neck  . Tobacco use   . UTI (lower urinary tract infection)   . Wrist pain    left wrist   Social History   Social History  . Marital status: Single    Spouse name: N/A  . Number of children: N/A  . Years of education: N/A   Occupational History  . Not on file.   Social History Main Topics  . Smoking status: Former Smoker    Packs/day: 1.00    Types: Cigarettes    Quit date: 07/26/2016  . Smokeless tobacco: Never Used  . Alcohol use No  . Drug use: No  . Sexual activity: Not on file   Other Topics Concern  . Not on file   Social History Narrative  . No narrative on file   Review of Systems  Constitutional: Negative.   HENT: Positive for sneezing. Negative for sore throat, tinnitus and trouble swallowing.   Eyes: Positive for redness and itching.  Cardiovascular: Negative.   Gastrointestinal: Negative.   Endocrine: Negative.    Genitourinary: Negative.   Musculoskeletal: Negative.   Allergic/Immunologic: Negative.   Neurological: Negative.   Hematological: Negative.   Psychiatric/Behavioral: Negative.        Objective:   Physical Exam  Constitutional: She is oriented to person, place, and time. She appears well-developed and well-nourished.  HENT:  Head: Normocephalic and atraumatic.  Right Ear: External ear normal.  Left Ear: External ear normal.  Nose: Nose normal.  Mouth/Throat: Oropharynx is clear and moist.  TM cloudy bilaterally  Eyes: Pupils are equal, round, and reactive to light.  Neck: Normal range of motion. Neck supple.  Cardiovascular: Normal rate, regular rhythm, normal heart sounds and intact distal pulses.   Pulmonary/Chest: Effort normal and breath sounds normal.  Abdominal: Soft. Bowel sounds are normal.  Musculoskeletal: Normal range of motion.  Neurological: She is alert and oriented to person, place, and time.  Skin: Skin is warm and dry.  Psychiatric: She has a normal mood and affect. Her behavior is normal. Judgment and thought content normal.     BP (!) 135/55 (BP Location: Left Arm, Patient Position: Sitting, Cuff Size: Normal)   Pulse 62   Temp 98.2 F (36.8 C) (Oral)   Resp 16   Ht 4\' 11"  (1.499 m)   Wt 174 lb (78.9 kg)   LMP 08/22/2011   SpO2 99%   BMI 35.14 kg/m  Assessment & Plan:  1. Hyperlipidemia, unspecified hyperlipidemia type The patient is asked to make an attempt to improve diet and exercise patterns to aid in medical management of this problem.Recommend a lowfat, low carbohydrate diet divided over 5-6 small meals, increase water intake to 6-8 glasses, and 150 minutes per week of cardiovascular exercise.    -A low fat, low cholesterol is discussed with the patient, and a written copy is given to her. - Omega-3 Fatty Acids (FISH OIL) 1000 MG CPDR; Take 1,000 mg by mouth daily.  Dispense: 30 capsule; Refill: 11 - pravastatin (PRAVACHOL) 80 MG tablet; Take 1  tablet (80 mg total) by mouth daily.  Dispense: 90 tablet; Refill: 2 -Lipid panel   2. Environmental allergies - levocetirizine (XYZAL) 5 MG tablet; Take 1 tablet (5 mg total) by mouth every evening.  Dispense: 30 tablet; Refill: 11 - Olopatadine HCl 0.2 % SOLN; Apply 1 drop to eye daily.  Dispense: 1 Bottle; Refill: 1    RTC: 6 months for hyperlipidemia   [name]  MSN, FNP-C [facility] [address] [place],  [address] [phone]

## 2016-11-21 NOTE — Patient Instructions (Addendum)
Continue Pravastatin 80 mg daily.  Continue daily fish oil tablet Recommend a lowfat, low carbohydrate diet divided over 5-6 small meals, increase water intake to 6-8 glasses, and 150 minutes per week of cardiovascular exercise.   Fat and Cholesterol Restricted Diet Getting too much fat and cholesterol in your diet may cause health problems. Following this diet helps keep your fat and cholesterol at normal levels. This can keep you from getting sick. What types of fat should I choose?  Choose monosaturated and polyunsaturated fats. These are found in foods such as olive oil, canola oil, flaxseeds, walnuts, almonds, and seeds.  Eat more omega-3 fats. Good choices include salmon, mackerel, sardines, tuna, flaxseed oil, and ground flaxseeds.  Limit saturated fats. These are in animal products such as meats, butter, and cream. They can also be in plant products such as palm oil, palm kernel oil, and coconut oil.  Avoid foods with partially hydrogenated oils in them. These contain trans fats. Examples of foods that have trans fats are stick margarine, some tub margarines, cookies, crackers, and other baked goods. What general guidelines do I need to follow?  Check food labels. Look for the words "trans fat" and "saturated fat."  When preparing a meal:  Fill half of your plate with vegetables and green salads.  Fill one fourth of your plate with whole grains. Look for the word "whole" as the first word in the ingredient list.  Fill one fourth of your plate with lean protein foods.  Eat more foods that have fiber, like apples, carrots, beans, peas, and barley.  Eat more home-cooked foods. Eat less at restaurants and buffets.  Limit or avoid alcohol.  Limit foods high in starch and sugar.  Limit fried foods.  Cook foods without frying them. Baking, boiling, grilling, and broiling are all great options.  Lose weight if you are overweight. Losing even a small amount of weight can help  your overall health. It can also help prevent diseases such as diabetes and heart disease. What foods can I eat? Grains  Whole grains, such as whole wheat or whole grain breads, crackers, cereals, and pasta. Unsweetened oatmeal, bulgur, barley, quinoa, or brown rice. Corn or whole wheat flour tortillas. Vegetables  Fresh or frozen vegetables (raw, steamed, roasted, or grilled). Green salads. Fruits  All fresh, canned (in natural juice), or frozen fruits. Meat and Other Protein Products  Ground beef (85% or leaner), grass-fed beef, or beef trimmed of fat. Skinless chicken or [place]. Ground chicken or [place]. Pork trimmed of fat. All fish and seafood. Eggs. Dried beans, peas, or lentils. Unsalted nuts or seeds. Unsalted canned or dry beans. Dairy  Low-fat dairy products, such as skim or 1% milk, 2% or reduced-fat cheeses, low-fat ricotta or cottage cheese, or plain low-fat yogurt. Fats and Oils  Tub margarines without trans fats. Light or reduced-fat mayonnaise and salad dressings. Avocado. Olive, canola, sesame, or safflower oils. Natural peanut or almond butter (choose ones without added sugar and oil). The items listed above may not be a complete list of recommended foods or beverages. Contact your dietitian for more options.  What foods are not recommended? Grains  White bread. White pasta. White rice. Cornbread. Bagels, pastries, and croissants. Crackers that contain trans fat. Vegetables  White potatoes. Corn. Creamed or fried vegetables. Vegetables in a cheese sauce. Fruits  Dried fruits. Canned fruit in light or heavy syrup. Fruit juice. Meat and Other Protein Products  Fatty cuts of meat. Ribs, chicken wings, bacon, sausage, bologna,  salami, chitterlings, fatback, hot dogs, bratwurst, and packaged luncheon meats. Liver and organ meats. Dairy  Whole or 2% milk, cream, half-and-half, and cream cheese. Whole milk cheeses. Whole-fat or sweetened yogurt. Full-fat cheeses. Nondairy creamers  and whipped toppings. Processed cheese, cheese spreads, or cheese curds. Sweets and Desserts  Corn syrup, sugars, honey, and molasses. Candy. Jam and jelly. Syrup. Sweetened cereals. Cookies, pies, cakes, donuts, muffins, and ice cream. Fats and Oils  Butter, stick margarine, lard, shortening, ghee, or bacon fat. Coconut, palm kernel, or palm oils. Beverages  Alcohol. Sweetened drinks (such as sodas, lemonade, and fruit drinks or punches). The items listed above may not be a complete list of foods and beverages to avoid. Contact your dietitian for more information.  This information is not intended to replace advice given to you by your health care provider. Make sure you discuss any questions you have with your health care provider. Document Released: 01/31/2012 Document Revised: 04/07/2016 Document Reviewed: 10/31/2013 Elsevier Interactive Patient Education  2017 [employer]  Dyslipidemia Dyslipidemia is an imbalance of waxy, fat-like substances (lipids) in the blood. The body needs lipids in small amounts. Dyslipidemia often involves a high level of cholesterol or triglycerides, which are types of lipids. Common forms of dyslipidemia include:  High levels of bad cholesterol (LDL cholesterol). LDL is the type of cholesterol that causes fatty deposits (plaques) to build up in the blood vessels that carry blood away from your heart (arteries).  Low levels of good cholesterol (HDL cholesterol). HDL cholesterol is the type of cholesterol that protects against heart disease. High levels of HDL remove the LDL buildup from arteries.  High levels of triglycerides. Triglycerides are a fatty substance in the blood that is linked to a buildup of plaques in the arteries. You can develop dyslipidemia because of the genes you are born with (primary dyslipidemia) or changes that occur during your life (secondary dyslipidemia), or as a side effect of certain medical treatments. What are the  causes? Primary dyslipidemia is caused by changes (mutations) in genes that are passed down through families (inherited). These mutations cause several types of dyslipidemia. Mutations can result in disorders that make the body produce too much LDL cholesterol or triglycerides, or not enough HDL cholesterol. These disorders may lead to heart disease, arterial disease, or stroke at an early age. Causes of secondary dyslipidemia include certain lifestyle choices and diseases that lead to dyslipidemia, such as:  Eating a diet that is high in animal fat.  Not getting enough activity or exercise (having a sedentary lifestyle).  Having diabetes, kidney disease, liver disease, or thyroid disease.  Drinking large amounts of alcohol.  Using certain types of drugs. What increases the risk? You may be at greater risk for dyslipidemia if you are an older man or if you are a woman who has gone through menopause. Other risk factors include:  Having a family history of dyslipidemia.  Taking certain medicines, including birth control pills, steroids, some diuretics, beta-blockers, and some medicines forHIV.  Smoking cigarettes.  Eating a high-fat diet.  Drinking large amounts of alcohol.  Having certain medical conditions such as diabetes, polycystic ovary syndrome (PCOS), pregnancy, kidney disease, liver disease, or hypothyroidism.  Not exercising regularly.  Being overweight or obese with too much belly fat. What are the signs or symptoms? Dyslipidemia does not usually cause any symptoms. Very high lipid levels can cause fatty bumps under the skin (xanthomas) or a white or gray ring around the black center (pupil) of the eye.  Very high triglyceride levels can cause inflammation of the pancreas (pancreatitis). How is this diagnosed? Your health care provider may diagnose dyslipidemia based on a routine blood test (fasting blood test). Because most people do not have symptoms of the condition,  this blood testing (lipid profile) is done on adults age 63 and older and is repeated every 5 years. This test checks:  Total cholesterol. This is a measure of the total amount of cholesterol in your blood, including LDL cholesterol, HDL cholesterol, and triglycerides. A healthy number is below 200.  LDL cholesterol. The target number for LDL cholesterol is different for each person, depending on individual risk factors. For most people, a number below 100 is healthy. Ask your health care provider what your LDL cholesterol number should be.  HDL cholesterol. An HDL level of 60 or higher is best because it helps to protect against heart disease. A number below 58 for men or below 20 for women increases the risk for heart disease.  Triglycerides. A healthy triglyceride number is below 150. If your lipid profile is abnormal, your health care provider may do other blood tests to get more information about your condition. How is this treated? Treatment depends on the type of dyslipidemia that you have and your other risk factors for heart disease and stroke. Your health care provider will have a target range for your lipid levels based on this information. For many people, treatment starts with lifestyle changes, such as diet and exercise. Your health care provider may recommend that you:  Get regular exercise.  Make changes to your diet.  Quit smoking if you smoke. If diet changes and exercise do not help you reach your goals, your health care provider may also prescribe medicine to lower lipids. The most commonly prescribed type of medicine lowers your LDL cholesterol (statin drug). If you have a high triglyceride level, your provider may prescribe another type of drug (fibrate) or an omega-3 fish oil supplement, or both. Follow these instructions at home:  Take over-the-counter and prescription medicines only as told by your health care provider. This includes supplements.  Get regular  exercise. Start an aerobic exercise and strength training program as told by your health care provider. Ask your health care provider what activities are safe for you. Your health care provider may recommend:  30 minutes of aerobic activity 4-6 days a week. Brisk walking is an example of aerobic activity.  Strength training 2 days a week.  Eat a healthy diet as told by your health care provider. This can help you reach and maintain a healthy weight, lower your LDL cholesterol, and raise your HDL cholesterol. It may help to work with a diet and nutrition specialist (dietitian) to make a plan that is right for you. Your dietitian or health care provider may recommend:  Limiting your calories, if you are overweight.  Eating more fruits, vegetables, whole grains, fish, and lean meats.  Limiting saturated fat, trans fat, and cholesterol.  Follow instructions from your health care provider or dietitian about eating or drinking restrictions.  Limit alcohol intake to no more than one drink per day for nonpregnant women and two drinks per day for men. One drink equals 12 oz of beer, 5 oz of wine, or 1 oz of hard liquor.  Do not use any products that contain nicotine or tobacco, such as cigarettes and e-cigarettes. If you need help quitting, ask your health care provider.  Keep all follow-up visits as told by your health  care provider. This is important. Contact a health care provider if:  You are having trouble sticking to your exercise or diet plan.  You are struggling to quit smoking or control your use of alcohol. Summary  Dyslipidemia is an imbalance of waxy, fat-like substances (lipids) in the blood. The body needs lipids in small amounts. Dyslipidemia often involves a high level of cholesterol or triglycerides, which are types of lipids.  Treatment depends on the type of dyslipidemia that you have and your other risk factors for heart disease and stroke.  For many people, treatment  starts with lifestyle changes, such as diet and exercise. Your health care provider may also prescribe medicine to lower lipids. This information is not intended to replace advice given to you by your health care provider. Make sure you discuss any questions you have with your health care provider. Document Released: 08/06/2013 Document Revised: 03/28/2016 Document Reviewed: 03/28/2016 Elsevier Interactive Patient Education  2017 [employer].

## 2016-12-15 ENCOUNTER — Ambulatory Visit (INDEPENDENT_AMBULATORY_CARE_PROVIDER_SITE_OTHER): Payer: Medicaid Other | Admitting: Family Medicine

## 2016-12-15 ENCOUNTER — Encounter: Payer: Self-pay | Admitting: Family Medicine

## 2016-12-15 ENCOUNTER — Ambulatory Visit (HOSPITAL_COMMUNITY)
Admission: RE | Admit: 2016-12-15 | Discharge: 2016-12-15 | Disposition: A | Discharge status: Home / Self Care | Payer: Medicaid Other | Source: Ambulatory Visit | Attending: Family Medicine | Admitting: Family Medicine

## 2016-12-15 VITALS — BP 142/78 | HR 68 | Temp 97.8°F | Resp 16 | Ht 59.0 in | Wt 174.0 lb

## 2016-12-15 DIAGNOSIS — Z9109 Other allergy status, other than to drugs and biological substances: Secondary | ICD-10-CM | POA: Diagnosis not present

## 2016-12-15 DIAGNOSIS — R079 Chest pain, unspecified: Secondary | ICD-10-CM

## 2016-12-15 DIAGNOSIS — R0602 Shortness of breath: Secondary | ICD-10-CM | POA: Insufficient documentation

## 2016-12-15 DIAGNOSIS — B009 Herpesviral infection, unspecified: Secondary | ICD-10-CM | POA: Diagnosis not present

## 2016-12-15 LAB — CBC WITH DIFFERENTIAL/PLATELET
Basophils Absolute: 0 cells/uL (ref 0–200)
Basophils Relative: 0 %
EOS ABS: 130 {cells}/uL (ref 15–500)
Eosinophils Relative: 2 %
HCT: 41.2 % (ref 35.0–45.0)
HEMOGLOBIN: 14 g/dL (ref 11.7–15.5)
LYMPHS ABS: 3965 {cells}/uL — AB (ref 850–3900)
Lymphocytes Relative: 61 %
MCH: 28.5 pg (ref 27.0–33.0)
MCHC: 34 g/dL (ref 32.0–36.0)
MCV: 83.9 fL (ref 80.0–100.0)
MONO ABS: 520 {cells}/uL (ref 200–950)
MPV: 9.5 fL (ref 7.5–12.5)
Monocytes Relative: 8 %
Neutro Abs: 1885 cells/uL (ref 1500–7800)
Neutrophils Relative %: 29 %
Platelets: 327 10*3/uL (ref 140–400)
RBC: 4.91 MIL/uL (ref 3.80–5.10)
RDW: 14.4 % (ref 11.0–15.0)
WBC: 6.5 10*3/uL (ref 3.8–10.8)

## 2016-12-15 LAB — COMPLETE METABOLIC PANEL WITH GFR
ALBUMIN: 4.4 g/dL (ref 3.6–5.1)
ALK PHOS: 62 U/L (ref 33–130)
ALT: 29 U/L (ref 6–29)
AST: 23 U/L (ref 10–35)
BUN: 12 mg/dL (ref 7–25)
CALCIUM: 9.5 mg/dL (ref 8.6–10.4)
CHLORIDE: 106 mmol/L (ref 98–110)
CO2: 23 mmol/L (ref 20–31)
Creat: 0.82 mg/dL (ref 0.50–1.05)
GFR, Est Non African American: 84 mL/min (ref >=60)
Glucose, Bld: 98 mg/dL (ref 65–99)
POTASSIUM: 4.2 mmol/L (ref 3.5–5.3)
Sodium: 142 mmol/L (ref 135–146)
Total Bilirubin: 0.4 mg/dL (ref 0.2–1.2)
Total Protein: 7.8 g/dL (ref 6.1–8.1)

## 2016-12-15 LAB — TSH: TSH: 0.94 mIU/L

## 2016-12-15 MED ORDER — ALBUTEROL SULFATE HFA 108 (90 BASE) MCG/ACT IN AERS: 2.0000 | INHALATION_SPRAY | RESPIRATORY_TRACT | 2 refills | Status: DC | PRN

## 2016-12-15 MED ORDER — CETIRIZINE HCL 10 MG PO TABS: 10.0000 mg | ORAL_TABLET | Freq: Every day | ORAL | 11 refills | Status: DC

## 2016-12-15 MED ORDER — VALACYCLOVIR HCL 1 G PO TABS: 1000.0000 mg | ORAL_TABLET | Freq: Two times a day (BID) | ORAL | 0 refills | Status: DC

## 2016-12-15 NOTE — Progress Notes (Signed)
Ms. Lacresia Darwish, a 51 year old female with a history prediabetes and hyperlipidemia presents complaining of periodic shortness of breath and wheezing. She also endorses some chest pain and chest tightness. She does not have a history of asthma. She is a former smoker.    Shortness of Breath  This is a new problem. The current episode started in the past 7 days. The problem occurs constantly. The problem has been unchanged. Associated symptoms include chest pain, rhinorrhea, a sore throat, sputum production and wheezing. Pertinent negatives include no fever, headaches, leg swelling, neck pain or vomiting. Nothing aggravates the symptoms. The patient has no known risk factors for DVT/PE. She has tried nothing for the symptoms. There is no history of allergies, aspirin allergies, asthma, bronchiolitis, CAD, chronic lung disease, COPD, DVT, PE, pneumonia or a recent surgery.     Past Medical History:  Diagnosis Date  . Back pain   . Diabetes mellitus without complication ([place])   . High blood cholesterol   . Hypertension   . Kidney stone   . Normal cardiac stress test 02/2016   low risk  . Obesity   . Pain of cheek    and right side neck  . Tobacco use   . UTI (lower urinary tract infection)   . Wrist pain    left wrist   Social History   Social History  . Marital status: Single    Spouse name: N/A  . Number of children: N/A  . Years of education: N/A   Occupational History  . Not on file.   Social History Main Topics  . Smoking status: Former Smoker    Packs/day: 1.00    Types: Cigarettes    Quit date: 07/26/2016  . Smokeless tobacco: Never Used  . Alcohol use No  . Drug use: No  . Sexual activity: Not on file   Other Topics Concern  . Not on file   Social History Narrative  . No narrative on file    Review of Systems  Constitutional: Negative for chills, diaphoresis, fever and malaise/fatigue.  HENT: Positive for rhinorrhea and sore throat.   Eyes: Negative for  blurred vision and photophobia.  Respiratory: Positive for sputum production, shortness of breath and wheezing.   Cardiovascular: Positive for chest pain. Negative for leg swelling.  Gastrointestinal: Negative.  Negative for blood in stool, nausea and vomiting.  Genitourinary: Negative.   Musculoskeletal: Negative for back pain, falls, joint pain, myalgias and neck pain.  Neurological: Positive for tingling (around lips, lower extremities and buttocks) and sensory change. Negative for dizziness, speech change, focal weakness, seizures, loss of consciousness, weakness and headaches.  Endo/Heme/Allergies: Positive for environmental allergies.  Psychiatric/Behavioral: Negative.  Negative for depression, substance abuse and suicidal ideas.   Physical Exam  Constitutional: She is oriented to person, place, and time. No distress.  HENT:  Head: Normocephalic.  Right Ear: External ear normal.  Mouth/Throat: Oropharyngeal exudate (clear) present.  Eyes: Conjunctivae and EOM are normal. Pupils are equal, round, and reactive to light. Right eye exhibits no discharge. Left eye exhibits no discharge. No scleral icterus.  Neck: Normal range of motion. Neck supple.  Cardiovascular: Normal rate, regular rhythm, normal heart sounds and intact distal pulses.   Pulmonary/Chest: Effort normal and breath sounds normal.  Abdominal: Soft. Bowel sounds are normal.  Musculoskeletal: Normal range of motion.  Neurological: She is alert and oriented to person, place, and time. Gait normal.  Skin: Skin is warm and dry. She is not diaphoretic.  Psychiatric: Mood, memory, affect and judgment normal.    Plan   1. Chest pain, unspecified type There was no wheezing on auscultation.  - EKG 12-Lead - CBC with Differential - COMPLETE METABOLIC PANEL WITH GFR - TSH  2. Shortness of breath - DG Chest 2 View; Future - albuterol (PROVENTIL HFA;VENTOLIN HFA) 108 (90 Base) MCG/ACT inhaler; Inhale 2 puffs into the lungs  every 4 (four) hours as needed for wheezing or shortness of breath.  Dispense: 1 Inhaler; Refill: 2  3. Environmental allergies - cetirizine (ZYRTEC) 10 MG tablet; Take 1 tablet (10 mg total) by mouth daily.  Dispense: 30 tablet; Refill: 11  4. Herpes simplex type 1 infection Discussed prodromal symptoms with patient. She continues to have periodic tingling to lips, thighs and buttocks.  Previous  - valACYclovir (VALTREX) 1000 MG tablet; Take 1 tablet (1,000 mg total) by mouth 2 (two) times daily.  Dispense: 20 tablet; Refill: 0   RTC: Will follow up by phone with results.    [name]  MSN, FNP-C [facility] [address] [place], [place] [address] [phone]

## 2017-02-22 ENCOUNTER — Ambulatory Visit: Payer: Medicaid Other | Admitting: Family Medicine

## 2017-03-03 ENCOUNTER — Encounter: Payer: Self-pay | Admitting: Family Medicine

## 2017-03-03 ENCOUNTER — Ambulatory Visit (INDEPENDENT_AMBULATORY_CARE_PROVIDER_SITE_OTHER): Payer: Medicaid Other | Admitting: Family Medicine

## 2017-03-03 VITALS — BP 136/80 | HR 82 | Temp 98.3°F | Resp 16 | Ht 59.0 in | Wt 182.0 lb

## 2017-03-03 DIAGNOSIS — K219 Gastro-esophageal reflux disease without esophagitis: Secondary | ICD-10-CM

## 2017-03-03 DIAGNOSIS — F419 Anxiety disorder, unspecified: Secondary | ICD-10-CM | POA: Diagnosis not present

## 2017-03-03 DIAGNOSIS — E785 Hyperlipidemia, unspecified: Secondary | ICD-10-CM | POA: Diagnosis not present

## 2017-03-03 DIAGNOSIS — R7303 Prediabetes: Secondary | ICD-10-CM

## 2017-03-03 MED ORDER — OMEPRAZOLE 20 MG PO CPDR: 20.0000 mg | DELAYED_RELEASE_CAPSULE | Freq: Every day | ORAL | 3 refills | Status: DC

## 2017-03-03 MED ORDER — BUSPIRONE HCL 5 MG PO TABS: 5.0000 mg | ORAL_TABLET | Freq: Two times a day (BID) | ORAL | 1 refills | Status: DC

## 2017-03-03 MED ORDER — PRAVASTATIN SODIUM 20 MG PO TABS: 20.0000 mg | ORAL_TABLET | Freq: Every day | ORAL | 1 refills | Status: DC

## 2017-03-03 NOTE — Patient Instructions (Addendum)
Hyperlipidemia: Will decrease Pravastatin to 20 mg every evening with dinner.  Prediabetes and weight gain: Recommend a lowfat, low carbohydrate diet divided over 5-6 small meals, increase water intake to 6-8 glasses, and 150 minutes per week of cardiovascular exercise. I also sent a referral to diabetes and nutrition.  GERD: Start trial of Omeprazole 20 mg prior to eating. Discussed diet to control heartburn at length.  Anxiety: Started trial of Buspar 5 mg twice daily. Will follow up in 1 month.     Buspirone tablets What is this medicine? BUSPIRONE (byoo SPYE rone) is used to treat anxiety disorders. This medicine may be used for other purposes; ask your health care provider or pharmacist if you have questions. COMMON BRAND NAME(S): BuSpar What should I tell my health care provider before I take this medicine? They need to know if you have any of these conditions: -kidney or liver disease -an unusual or allergic reaction to buspirone, other medicines, foods, dyes, or preservatives -pregnant or trying to get pregnant -breast-feeding How should I use this medicine? Take this medicine by mouth with a glass of water. Follow the directions on the prescription label. You may take this medicine with or without food. To ensure that this medicine always works the same way for you, you should take it either always with or always without food. Take your doses at regular intervals. Do not take your medicine more often than directed. Do not stop taking except on the advice of your doctor or health care professional. Talk to your pediatrician regarding the use of this medicine in children. Special care may be needed. Overdosage: If you think you have taken too much of this medicine contact a poison control center or emergency room at once. NOTE: This medicine is only for you. Do not share this medicine with others. What if I miss a dose? If you miss a dose, take it as soon as you can. If it is almost  time for your next dose, take only that dose. Do not take double or extra doses. What may interact with this medicine? Do not take this medicine with any of the following medications: -linezolid -MAOIs like Carbex, Eldepryl, Marplan, Nardil, and Parnate -methylene blue -procarbazine This medicine may also interact with the following medications: -diazepam -digoxin -diltiazem -erythromycin -grapefruit juice -haloperidol -medicines for mental depression or mood problems -medicines for seizures like carbamazepine, phenobarbital and phenytoin -nefazodone -other medications for anxiety -rifampin -ritonavir -some antifungal medicines like itraconazole, ketoconazole, and voriconazole -verapamil -warfarin This list may not describe all possible interactions. Give your health care provider a list of all the medicines, herbs, non-prescription drugs, or dietary supplements you use. Also tell them if you smoke, drink alcohol, or use illegal drugs. Some items may interact with your medicine. What should I watch for while using this medicine? Visit your doctor or health care professional for regular checks on your progress. It may take 1 to 2 weeks before your anxiety gets better. You may get drowsy or dizzy. Do not drive, use machinery, or do anything that needs mental alertness until you know how this drug affects you. Do not stand or sit up quickly, especially if you are an older patient. This reduces the risk of dizzy or fainting spells. Alcohol can make you more drowsy and dizzy. Avoid alcoholic drinks. What side effects may I notice from receiving this medicine? Side effects that you should report to your doctor or health care professional as soon as possible: -blurred vision or other  vision changes -chest pain -confusion -difficulty breathing -feelings of hostility or anger -muscle aches and pains -numbness or tingling in hands or feet -ringing in the ears -skin rash and  itching -vomiting -weakness Side effects that usually do not require medical attention (report to your doctor or health care professional if they continue or are bothersome): -disturbed dreams, nightmares -headache -nausea -restlessness or nervousness -sore throat and nasal congestion -stomach upset This list may not describe all possible side effects. Call your doctor for medical advice about side effects. You may report side effects to FDA at 1-800-FDA-1088. Where should I keep my medicine? Keep out of the reach of children. Store at room temperature below 30 degrees C (86 degrees F). Protect from light. Keep container tightly closed. Throw away any unused medicine after the expiration date. NOTE: This sheet is a summary. It may not cover all possible information. If you have questions about this medicine, talk to your doctor, pharmacist, or health care provider.  2018 Elsevier/Gold Standard (2010-03-11 18:06:11) Living With Anxiety After being diagnosed with an anxiety disorder, you may be relieved to know why you have felt or behaved a certain way. It is natural to also feel overwhelmed about the treatment ahead and what it will mean for your life. With care and support, you can manage this condition and recover from it. How to cope with anxiety Dealing with stress Stress is your body's reaction to life changes and events, both good and bad. Stress can last just a few hours or it can be ongoing. Stress can play a major role in anxiety, so it is important to learn both how to cope with stress and how to think about it differently. Talk with your health care provider or a counselor to learn more about stress reduction. He or she may suggest some stress reduction techniques, such as:  Music therapy. This can include creating or listening to music that you enjoy and that inspires you.  Mindfulness-based meditation. This involves being aware of your normal breaths, rather than trying to  control your breathing. It can be done while sitting or walking.  Centering prayer. This is a kind of meditation that involves focusing on a word, phrase, or sacred image that is meaningful to you and that brings you peace.  Deep breathing. To do this, expand your stomach and inhale slowly through your nose. Hold your breath for 3-5 seconds. Then exhale slowly, allowing your stomach muscles to relax.  Self-talk. This is a skill where you identify thought patterns that lead to anxiety reactions and correct those thoughts.  Muscle relaxation. This involves tensing muscles then relaxing them.  Choose a stress reduction technique that fits your lifestyle and personality. Stress reduction techniques take time and practice. Set aside 5-15 minutes a day to do them. Therapists can offer training in these techniques. The training may be covered by some insurance plans. Other things you can do to manage stress include:  Keeping a stress diary. This can help you learn what triggers your stress and ways to control your response.  Thinking about how you respond to certain situations. You may not be able to control everything, but you can control your reaction.  Making time for activities that help you relax, and not feeling guilty about spending your time in this way.  Therapy combined with coping and stress-reduction skills provides the best chance for successful treatment. Medicines Medicines can help ease symptoms. Medicines for anxiety include:  Anti-anxiety drugs.  Antidepressants.  Beta-blockers.  Medicines may be used as the main treatment for anxiety disorder, along with therapy, or if other treatments are not working. Medicines should be prescribed by a health care provider. Relationships Relationships can play a big part in helping you recover. Try to spend more time connecting with trusted friends and family members. Consider going to couples counseling, taking family education classes,  or going to family therapy. Therapy can help you and others better understand the condition. How to recognize changes in your condition Everyone has a different response to treatment for anxiety. Recovery from anxiety happens when symptoms decrease and stop interfering with your daily activities at home or work. This may mean that you will start to:  Have better concentration and focus.  Sleep better.  Be less irritable.  Have more energy.  Have improved memory.  It is important to recognize when your condition is getting worse. Contact your health care provider if your symptoms interfere with home or work and you do not feel like your condition is improving. Where to find help and support: You can get help and support from these sources:  Self-help groups.  Online and [employer].  A trusted spiritual leader.  Couples counseling.  Family education classes.  Family therapy.  Follow these instructions at home:  Eat a healthy diet that includes plenty of vegetables, fruits, whole grains, low-fat dairy products, and lean protein. Do not eat a lot of foods that are high in solid fats, added sugars, or salt.  Exercise. Most adults should do the following: ? Exercise for at least 150 minutes each week. The exercise should increase your heart rate and make you sweat (moderate-intensity exercise). ? Strengthening exercises at least twice a week.  Cut down on caffeine, tobacco, alcohol, and other potentially harmful substances.  Get the right amount and quality of sleep. Most adults need 7-9 hours of sleep each night.  Make choices that simplify your life.  Take over-the-counter and prescription medicines only as told by your health care provider.  Avoid caffeine, alcohol, and certain over-the-counter cold medicines. These may make you feel worse. Ask your pharmacist which medicines to avoid.  Keep all follow-up visits as told by your health care provider. This is  important. Questions to ask your health care provider  Would I benefit from therapy?  How often should I follow up with a health care provider?  How long do I need to take medicine?  Are there any long-term side effects of my medicine?  Are there any alternatives to taking medicine? Contact a health care provider if:  You have a hard time staying focused or finishing daily tasks.  You spend many hours a day feeling worried about everyday life.  You become exhausted by worry.  You start to have headaches, feel tense, or have nausea.  You urinate more than normal.  You have diarrhea. Get help right away if:  You have a racing heart and shortness of breath.  You have thoughts of hurting yourself or others. If you ever feel like you may hurt yourself or others, or have thoughts about taking your own life, get help right away. You can go to your nearest emergency department or call:  Your local emergency services (911 in the [place]  A suicide crisis helpline, such as the [facility] at 713-801-0025. This is open 24-hours a day.  Summary  Taking steps to deal with stress can help calm you.  Medicines cannot cure anxiety disorders, but they can  help ease symptoms.  Family, friends, and partners can play a big part in helping you recover from an anxiety disorder. This information is not intended to replace advice given to you by your health care provider. Make sure you discuss any questions you have with your health care provider. Document Released: 07/26/2016 Document Revised: 07/26/2016 Document Reviewed: 07/26/2016 Elsevier Interactive Patient Education  [employer].

## 2017-03-03 NOTE — Progress Notes (Signed)
Jessica Lawson, a 51 year old female with a history of prediabetes and hyperlipidemia presents complaining of muscle aches , weight gain, and increased anxiety. [name] has been prescribed Pravastatin 80 mg for markedly elevated triglycerides and increased LDL. She says that she was having muscle aches when taking 80 mg of Atorvastatin. Dose was reduced to 20 mg and muscle aches improved. She has been exercising daily and following a low fat diet. She says that she continues to gain weight with exercising 3 days per week and following a low fat diet. Patient has gained 17 pounds over the past 6 months.    Patient also complains of increased anxiety. 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 She says that she feels increase anxiety since that time.  She has the following symptoms: difficulty concentrating, feelings of losing control, irritable, racing thoughts. She denies current suicidal and homicidal ideation. Past Medical History:  Diagnosis Date  . Back pain   . Diabetes mellitus without complication ([place])   . High blood cholesterol   . Hypertension   . Kidney stone   . Normal cardiac stress test 02/2016   low risk  . Obesity   . Pain of cheek    and right side neck  . Tobacco use   . UTI (lower urinary tract infection)   . Wrist pain    left wrist   Social History   Social History  . Marital status: Single    Spouse name: N/A  . Number of children: N/A  . Years of education: N/A   Occupational History  . Not on file.   Social History Main Topics  . Smoking status: Former Smoker    Packs/day: 1.00    Types: Cigarettes    Quit date: 07/26/2016  . Smokeless tobacco: Never Used  . Alcohol use No  . Drug use: No  . Sexual activity: Not on file   Other Topics Concern  . Not on file   Social History Narrative  . No narrative on file   Allergies  Allergen Reactions  . Asa [Aspirin] Anaphylaxis, Hives and Swelling  . Iohexol Other (See Comments)   Desc: pt complains of difficulty swallowing and thickened tongue/ throat closes up   . Penicillins Hives, Swelling and Other (See Comments)    Thrush   . Sulfa Antibiotics Anaphylaxis, Hives, Swelling and Other (See Comments)   Immunization History  Administered Date(s) Administered  . Pneumococcal Polysaccharide-23 09/03/2015   GAD 7 : Generalized Anxiety Score 03/03/2017  Nervous, Anxious, on Edge 1  Control/stop worrying 1  Worry too much - different things 1  Trouble relaxing 1  Restless 0  Easily annoyed or irritable 1  Afraid - awful might happen 2  Total GAD 7 Score 7  Anxiety Difficulty Not difficult at all     Review of Systems  Constitutional:       Weight gain  HENT: Negative.   Eyes: Negative.   Respiratory: Negative.   Cardiovascular: Negative.   Gastrointestinal: Positive for heartburn.  Genitourinary: Negative.  Negative for dysuria and urgency.  Musculoskeletal: Negative.   Skin: Negative.   Neurological: Negative.   Psychiatric/Behavioral: The patient is nervous/anxious.   Physical Exam  Constitutional: She is well-developed, well-nourished, and in no distress.  HENT:  Head: Normocephalic and atraumatic.  Right Ear: External ear normal.  Left Ear: External ear normal.  Nose: Nose normal.  Mouth/Throat: Oropharynx is clear and moist.  Eyes: Pupils are equal, round, and  reactive to light.  Neck: Normal range of motion.  Cardiovascular: Normal rate, regular rhythm, normal heart sounds and intact distal pulses.   Pulmonary/Chest: Effort normal and breath sounds normal.  Abdominal: Soft. Bowel sounds are normal.  Increased abdominal girth  Neurological: She is alert. Gait normal.  Skin: Skin is warm and dry.  Psychiatric: Memory, affect and judgment normal. Her mood appears anxious. She is not agitated. She expresses homicidal ideation. She expresses no suicidal ideation. She does not have a flat affect.   Plan  1. Hyperlipidemia, unspecified  hyperlipidemia type Will decrease pravastatin to 20 mg with dinner. Will recheck cholesterol panel in 1 month.  - pravastatin (PRAVACHOL) 20 MG tablet; Take 1 tablet (20 mg total) by mouth daily.  Dispense: 90 tablet; Refill: 1  2. Prediabetes Discussed diet and exercise at length. [name] may benefit from diabetes educator. Recommend a lowfat, low carbohydrate diet divided over 5-6 small meals, increase water intake to 6-8 glasses, and 150 minutes per week of cardiovascular exercise. Work harder on portion control and eat largest meal earlier in day.    - Ambulatory referral to diabetic education - Hemoglobin A1c  3. Gastroesophageal reflux disease without esophagitis Will start a trial of omeprazole for heartburn. Advised to take in am prior to eating.  - omeprazole (PRILOSEC) 20 MG capsule; Take 1 capsule (20 mg total) by mouth daily.  Dispense: 30 capsule; Refill: 3  4. Anxiety Will start a trial of Buspar 5 mg twice daily for increased anxiety. Will follow up in 1 month.   - busPIRone (BUSPAR) 5 MG tablet; Take 1 tablet (5 mg total) by mouth 2 (two) times daily.  Dispense: 60 tablet; Refill: 1   RTC: 1 month   [place]  MSN, FNP-C [place] [facility] [place] [address] [place],  [address] [phone]

## 2017-03-04 LAB — HEMOGLOBIN A1C
HEMOGLOBIN A1C: 6.2 % — AB (ref <5.7)
MEAN PLASMA GLUCOSE: 131 mg/dL

## 2017-03-06 ENCOUNTER — Telehealth: Payer: Self-pay

## 2017-03-06 NOTE — Telephone Encounter (Signed)
-----   Message from Jessica Lawson, [place] sent at 03/04/2017  9:50 AM EDT ----- Regarding: lab resutls Please inform Jessica Lawson that hemoglobin a1C has increased to 6.2 from 5.8. Recommend a lowfat, low carbohydrate diet divided over 5-6 small meals, increase water intake to 6-8 glasses, and 150 minutes per week of cardiovascular exercise. Also, make sure that she is consuming largest meal earlier in the day. I have sent a referral to diabetes education for further instruction concerning diet.   Thanks

## 2017-03-06 NOTE — Telephone Encounter (Signed)
Called and spoke with patient. Advised that hgba1c had incrased to 6.2. Stressed the importance of eating a lowfat/ low cholesterol diet over 5 to 6 small meals daily, drinking 6 to 8 glasses of water daily, and exercising 150 minutes weekly. Advised of referral being sent to diabetes education center. Patient verbalized understanding and had no questions at this time. Thanks!

## 2017-04-06 ENCOUNTER — Ambulatory Visit (INDEPENDENT_AMBULATORY_CARE_PROVIDER_SITE_OTHER): Payer: Medicaid Other | Admitting: Family Medicine

## 2017-04-06 ENCOUNTER — Encounter: Payer: Self-pay | Admitting: Family Medicine

## 2017-04-06 VITALS — BP 134/69 | HR 72 | Temp 97.8°F | Resp 16 | Ht 59.0 in | Wt 183.0 lb

## 2017-04-06 DIAGNOSIS — H938X3 Other specified disorders of ear, bilateral: Secondary | ICD-10-CM | POA: Diagnosis not present

## 2017-04-06 DIAGNOSIS — F419 Anxiety disorder, unspecified: Secondary | ICD-10-CM | POA: Diagnosis not present

## 2017-04-06 DIAGNOSIS — E785 Hyperlipidemia, unspecified: Secondary | ICD-10-CM

## 2017-04-06 DIAGNOSIS — J3489 Other specified disorders of nose and nasal sinuses: Secondary | ICD-10-CM | POA: Diagnosis not present

## 2017-04-06 DIAGNOSIS — Z6836 Body mass index (BMI) 36.0-36.9, adult: Secondary | ICD-10-CM | POA: Diagnosis not present

## 2017-04-06 DIAGNOSIS — E6609 Other obesity due to excess calories: Secondary | ICD-10-CM

## 2017-04-06 MED ORDER — CHLORPHEN-PE-ACETAMINOPHEN 4-10-325 MG PO TABS: 1.0000 | ORAL_TABLET | Freq: Four times a day (QID) | ORAL | 0 refills | Status: DC | PRN

## 2017-04-06 NOTE — Patient Instructions (Addendum)
Ear Fullness and sinus pressure: Norel AD every 6 hours as needed for ear pressure and ear fullness. Avoid allergens (fresh cut grass, pets, plants, dust, mold)  Calorie Counting for Weight Loss Calories are units of energy. Your body needs a certain amount of calories from food to keep you going throughout the day. When you eat more calories than your body needs, your body stores the extra calories as fat. When you eat fewer calories than your body needs, your body burns fat to get the energy it needs. Calorie counting means keeping track of how many calories you eat and drink each day. Calorie counting can be helpful if you need to lose weight. If you make sure to eat fewer calories than your body needs, you should lose weight. Ask your health care provider what a healthy weight is for you. For calorie counting to work, you will need to eat the right number of calories in a day in order to lose a healthy amount of weight per week. A dietitian can help you determine how many calories you need in a day and will give you suggestions on how to reach your calorie goal.  A healthy amount of weight to lose per week is usually 1-2 lb (0.5-0.9 kg). This usually means that your daily calorie intake should be reduced by 500-750 calories.  Eating 1,200 - 1,500 calories per day can help most women lose weight.  Eating 1,500 - 1,800 calories per day can help most men lose weight.  What is my plan? My goal is to have __________ calories per day. If I have this many calories per day, I should lose around __________ pounds per week. What do I need to know about calorie counting? In order to meet your daily calorie goal, you will need to:  Find out how many calories are in each food you would like to eat. Try to do this before you eat.  Decide how much of the food you plan to eat.  Write down what you ate and how many calories it had. Doing this is called keeping a food log.  To successfully lose weight, it  is important to balance calorie counting with a healthy lifestyle that includes regular activity. Aim for 150 minutes of moderate exercise (such as walking) or 75 minutes of vigorous exercise (such as running) each week. Where do I find calorie information?  The number of calories in a food can be found on a Nutrition Facts label. If a food does not have a Nutrition Facts label, try to look up the calories online or ask your dietitian for help. Remember that calories are listed per serving. If you choose to have more than one serving of a food, you will have to multiply the calories per serving by the amount of servings you plan to eat. For example, the label on a package of bread might say that a serving size is 1 slice and that there are 90 calories in a serving. If you eat 1 slice, you will have eaten 90 calories. If you eat 2 slices, you will have eaten 180 calories. How do I keep a food log? Immediately after each meal, record the following information in your food log:  What you ate. Don't forget to include toppings, sauces, and other extras on the food.  How much you ate. This can be measured in cups, ounces, or number of items.  How many calories each food and drink had.  The total number of  calories in the meal.  Keep your food log near you, such as in a small notebook in your pocket, or use a mobile app or website. Some programs will calculate calories for you and show you how many calories you have left for the day to meet your goal. What are some calorie counting tips?  Use your calories on foods and drinks that will fill you up and not leave you hungry: ? Some examples of foods that fill you up are nuts and nut butters, vegetables, lean proteins, and high-fiber foods like whole grains. High-fiber foods are foods with more than 5 g fiber per serving. ? Drinks such as sodas, specialty coffee drinks, alcohol, and juices have a lot of calories, yet do not fill you up.  Eat nutritious  foods and avoid empty calories. Empty calories are calories you get from foods or beverages that do not have many vitamins or protein, such as candy, sweets, and soda. It is better to have a nutritious high-calorie food (such as an avocado) than a food with few nutrients (such as a bag of chips).  Know how many calories are in the foods you eat most often. This will help you calculate calorie counts faster.  Pay attention to calories in drinks. Low-calorie drinks include water and unsweetened drinks.  Pay attention to nutrition labels for "low fat" or "fat free" foods. These foods sometimes have the same amount of calories or more calories than the full fat versions. They also often have added sugar, starch, or salt, to make up for flavor that was removed with the fat.  Find a way of tracking calories that works for you. Get creative. Try different apps or programs if writing down calories does not work for you. What are some portion control tips?  Know how many calories are in a serving. This will help you know how many servings of a certain food you can have.  Use a measuring cup to measure serving sizes. You could also try weighing out portions on a kitchen scale. With time, you will be able to estimate serving sizes for some foods.  Take some time to put servings of different foods on your favorite plates, bowls, and cups so you know what a serving looks like.  Try not to eat straight from a bag or box. Doing this can lead to overeating. Put the amount you would like to eat in a cup or on a plate to make sure you are eating the right portion.  Use smaller plates, glasses, and bowls to prevent overeating.  Try not to multitask (for example, watch TV or use your computer) while eating. If it is time to eat, sit down at a table and enjoy your food. This will help you to know when you are full. It will also help you to be aware of what you are eating and how much you are eating. What are tips  for following this plan? Reading food labels  Check the calorie count compared to the serving size. The serving size may be smaller than what you are used to eating.  Check the source of the calories. Make sure the food you are eating is high in vitamins and protein and low in saturated and trans fats. Shopping  Read nutrition labels while you shop. This will help you make healthy decisions before you decide to purchase your food.  Make a grocery list and stick to it. Cooking  Try to cook your favorite foods in  a healthier way. For example, try baking instead of frying.  Use low-fat dairy products. Meal planning  Use more fruits and vegetables. Half of your plate should be fruits and vegetables.  Include lean proteins like poultry and fish. How do I count calories when eating out?  Ask for smaller portion sizes.  Consider sharing an entree and sides instead of getting your own entree.  If you get your own entree, eat only half. Ask for a box at the beginning of your meal and put the rest of your entree in it so you are not tempted to eat it.  If calories are listed on the menu, choose the lower calorie options.  Choose dishes that include vegetables, fruits, whole grains, low-fat dairy products, and lean protein.  Choose items that are boiled, broiled, grilled, or steamed. Stay away from items that are buttered, battered, fried, or served with cream sauce. Items labeled "crispy" are usually fried, unless stated otherwise.  Choose water, low-fat milk, unsweetened iced tea, or other drinks without added sugar. If you want an alcoholic beverage, choose a lower calorie option such as a glass of wine or light beer.  Ask for dressings, sauces, and syrups on the side. These are usually high in calories, so you should limit the amount you eat.  If you want a salad, choose a garden salad and ask for grilled meats. Avoid extra toppings like bacon, cheese, or fried items. Ask for the  dressing on the side, or ask for olive oil and vinegar or lemon to use as dressing.  Estimate how many servings of a food you are given. For example, a serving of cooked rice is  cup or about the size of half a baseball. Knowing serving sizes will help you be aware of how much food you are eating at restaurants. The list below tells you how big or small some common portion sizes are based on everyday objects: ? 1 oz-4 stacked dice. ? 3 oz-1 deck of cards. ? 1 tsp-1 die. ? 1 Tbsp- a ping-pong ball. ? 2 Tbsp-1 ping-pong ball. ?  cup- baseball. ? 1 cup-1 baseball. Summary  Calorie counting means keeping track of how many calories you eat and drink each day. If you eat fewer calories than your body needs, you should lose weight.  A healthy amount of weight to lose per week is usually 1-2 lb (0.5-0.9 kg). This usually means reducing your daily calorie intake by 500-750 calories.  The number of calories in a food can be found on a Nutrition Facts label. If a food does not have a Nutrition Facts label, try to look up the calories online or ask your dietitian for help.  Use your calories on foods and drinks that will fill you up, and not on foods and drinks that will leave you hungry.  Use smaller plates, glasses, and bowls to prevent overeating. This information is not intended to replace advice given to you by your health care provider. Make sure you discuss any questions you have with your health care provider. Document Released: 08/01/2005 Document Revised: 07/01/2016 Document Reviewed: 07/01/2016 Elsevier Interactive Patient Education  2017 [employer]. Exercising to [employer] Exercising can help you to lose weight. In order to lose weight through exercise, you need to do vigorous-intensity exercise. You can tell that you are exercising with vigorous intensity if you are breathing very hard and fast and cannot hold a conversation while exercising. Moderate-intensity exercise helps to  maintain your current weight.  You can tell that you are exercising at a moderate level if you have a higher heart rate and faster breathing, but you are still able to hold a conversation. How often should I exercise? Choose an activity that you enjoy and set realistic goals. Your health care provider can help you to make an activity plan that works for you. Exercise regularly as directed by your health care provider. This may include:  Doing resistance training twice each week, such as: ? Push-ups. ? Sit-ups. ? Lifting weights. ? Using resistance bands.  Doing a given intensity of exercise for a given amount of time. Choose from these options: ? 150 minutes of moderate-intensity exercise every week. ? 75 minutes of vigorous-intensity exercise every week. ? A mix of moderate-intensity and vigorous-intensity exercise every week.  Children, pregnant women, people who are out of shape, people who are overweight, and older adults may need to consult a health care provider for individual recommendations. If you have any sort of medical condition, be sure to consult your health care provider before starting a new exercise program. What are some activities that can help me to lose weight?  Walking at a rate of at least 4.5 miles an hour.  Jogging or running at a rate of 5 miles per hour.  Biking at a rate of at least 10 miles per hour.  Lap swimming.  Roller-skating or in-line skating.  Cross-country skiing.  Vigorous competitive sports, such as football, basketball, and soccer.  Jumping rope.  Aerobic dancing. How can I be more active in my day-to-day activities?  Use the stairs instead of the elevator.  Take a walk during your lunch break.  If you drive, park your car farther away from work or school.  If you take public transportation, get off one stop early and walk the rest of the way.  Make all of your phone calls while standing up and walking around.  Get up, stretch,  and walk around every 30 minutes throughout the day. What guidelines should I follow while exercising?  Do not exercise so much that you hurt yourself, feel dizzy, or get very short of breath.  Consult your health care provider prior to starting a new exercise program.  Wear comfortable clothes and shoes with good support.  Drink plenty of water while you exercise to prevent dehydration or heat stroke. Body water is lost during exercise and must be replaced.  Work out until you breathe faster and your heart beats faster. This information is not intended to replace advice given to you by your health care provider. Make sure you discuss any questions you have with your health care provider. Document Released: 09/03/2010 Document Revised: 01/07/2016 Document Reviewed: 01/02/2014 Elsevier Interactive Patient Education  [employer].

## 2017-04-06 NOTE — Progress Notes (Signed)
Chief Complaint  Patient presents with  . Follow-up    1 month follow up   . Ear Fullness    both    Jessica Lawson, a 51 year old female with a history of prediabetes and hyperlipidemia presents for 1 month follow up. Jessica Lawson was diagnosed with anxiety 1 month ago. Patient says that anxiety has improved on Buspar. She has been taking medication consistently. She is not having symptoms of anxiety on today.  She denies current suicidal and homicidal ideation.    Patient is complaining about obesity and weight gain. She says that she has been gaining weight since starting pravastatin. She has been exercising 3 days per week and has been eating healthier. She says that this is the largest she has been and she feels uncomfortable. She typically cooks meals at home. She eats 2-3 meals per day. She does not control portions. She typically walks in the neighborhood several times per week.   Patient is also complaining of sinus pressure and fullness to ears bilaterally over the past 5 days. She denies sore throat, fever, fatigue, or coughing.  Past Medical History:  Diagnosis Date  . Back pain   . Diabetes mellitus without complication ([place])   . High blood cholesterol   . Hypertension   . Kidney stone   . Normal cardiac stress test 02/2016   low risk  . Obesity   . Pain of cheek    and right side neck  . Tobacco use   . UTI (lower urinary tract infection)   . Wrist pain    left wrist   Social History   Social History  . Marital status: Single    Spouse name: N/A  . Number of children: N/A  . Years of education: N/A   Occupational History  . Not on file.   Social History Main Topics  . Smoking status: Former Smoker    Packs/day: 1.00    Types: Cigarettes    Quit date: 07/26/2016  . Smokeless tobacco: Never Used  . Alcohol use No  . Drug use: No  . Sexual activity: Not on file   Other Topics Concern  . Not on file   Social History Narrative  . No narrative on file    Immunization History  Administered Date(s) Administered  . Pneumococcal Polysaccharide-23 09/03/2015  Review of Systems  Constitutional: Negative.        Weight gain  HENT: Positive for congestion. Negative for sinus pain and sore throat.        Ear fullness  Eyes: Negative.   Respiratory: Negative.   Cardiovascular: Negative.   Gastrointestinal: Negative.  Negative for constipation and diarrhea.  Genitourinary: Negative.   Musculoskeletal: Negative.   Skin: Negative.   Neurological: Negative.   Endo/Heme/Allergies: Negative.   Psychiatric/Behavioral: Negative.   Physical Exam  Constitutional: She is oriented to person, place, and time and well-developed, well-nourished, and in no distress.  HENT:  Head: Normocephalic and atraumatic.  Right Ear: External ear normal. No drainage or swelling. No decreased hearing is noted.  Left Ear: External ear normal. No drainage or swelling. No decreased hearing is noted.  Nose: Nose normal. Right sinus exhibits no maxillary sinus tenderness and no frontal sinus tenderness. Left sinus exhibits no maxillary sinus tenderness and no frontal sinus tenderness.  Mouth/Throat: Oropharynx is clear and moist.  TM Dull bilaterally  Eyes: Pupils are equal, round, and reactive to light. Conjunctivae and EOM are normal.  Neck: Normal range of motion. Neck  supple.  Cardiovascular: Normal rate, regular rhythm, normal heart sounds and intact distal pulses.   Pulmonary/Chest: Effort normal and breath sounds normal.  Abdominal: Soft. Bowel sounds are normal.  Increased abdominal fat  Musculoskeletal: Normal range of motion.  Neurological: She is alert and oriented to person, place, and time. She has normal reflexes. Gait normal. GCS score is 15.  Skin: Skin is warm and dry.  Psychiatric: Mood, affect and judgment normal.   BP 134/69 (BP Location: Left Arm, Patient Position: Sitting, Cuff Size: Large)   Pulse 72   Temp 97.8 F (36.6 C) (Oral)   Resp 16    Ht 4\' 11"  (1.499 m)   Wt 183 lb (83 kg)   LMP 08/22/2011 Comment: patient states LMP 20 years ago  SpO2 100%   BMI 36.96 kg/m   1. Anxiety Anxiety controlled, will continue Buspar as previously prescribed  2. Hyperlipidemia, unspecified hyperlipidemia type The 10-year ASCVD risk score [name], et al., 2013) is: 10.7%   Values used to calculate the score:     Age: 34 years     Sex: Female     Is Non-Hispanic African American: Yes     Diabetic: Yes     Tobacco smoker: No     Systolic Blood Pressure: 144 mmHg     Is BP treated: No     HDL Cholesterol: 41 mg/dL     Total Cholesterol: 250 mg/dL  - Lipid Panel  3. Sinus pressure - Chlorphen-PE-Acetaminophen (NOREL AD) 4-10-325 MG TABS; Take 1 tablet by mouth every 6 (six) hours as needed.  Dispense: 20 tablet; Refill: 0  4. Sensation of fullness in both ears Continue antihistamine daily. Refrain from using q-tips or objects in ears  5. Class 2 obesity due to excess calories with body mass index (BMI) of 36.0 to 36.9 in adult, unspecified whether serious comorbidity present Recommend a lowfat, low carbohydrate diet divided over 5-6 small meals, increase water intake to 6-8 glasses, and 150 minutes per week of cardiovascular exercise.    RTC: 3 months for chronic conditions. Will follow up by phone with any abnormal laboratory results   [name]  MSN, FNP-C [facility] [address] [place], [place] [address] [phone]

## 2017-04-07 LAB — LIPID PANEL
Cholesterol: 250 mg/dL — ABNORMAL HIGH
HDL: 41 mg/dL — ABNORMAL LOW
LDL Cholesterol: 159 mg/dL — ABNORMAL HIGH
Total CHOL/HDL Ratio: 6.1 ratio — ABNORMAL HIGH
Triglycerides: 249 mg/dL — ABNORMAL HIGH
VLDL: 50 mg/dL — ABNORMAL HIGH

## 2017-04-10 ENCOUNTER — Telehealth: Payer: Self-pay

## 2017-04-10 NOTE — Telephone Encounter (Signed)
-----   Message from Dorena Dew, [place] sent at 04/09/2017  2:07 PM EDT ----- Regarding: lab results Please inform Ms. Nied that cholesterol has improved on statin therapy. Recommend a lowfat, low carbohydrate diet divided over 5-6 small meals, increase water intake to 6-8 glasses, and 150 minutes per week of cardiovascular exercise.   Also, continue 1800 calorie diet as discussed during appointment. Will review weight during scheduled follow up.   Thanks ----- Message ----- From: Interface, [facility] Sent: 04/07/2017   3:27 AM To: Dorena Dew, FNP

## 2017-04-10 NOTE — Telephone Encounter (Signed)
Called and spoke with patient, advised that cholesterol is improved on statin therapy to continue medication as prescribed. Advised to eat lowfat/low carb diet over 5 to 6 small meals daily, increase water intake to 6 to 8 glasses daily, and to exercise 150 minutes weekly of cardio. Asked that patient try to continue 1800 calorie diet that was given during appointment, and asked that patient keeps next follow up. Thanks!

## 2017-05-06 ENCOUNTER — Encounter (HOSPITAL_COMMUNITY): Payer: Self-pay | Admitting: Emergency Medicine

## 2017-05-06 ENCOUNTER — Emergency Department (HOSPITAL_COMMUNITY)
Admission: EM | Admit: 2017-05-06 | Discharge: 2017-05-07 | Disposition: A | Discharge status: Home / Self Care | Payer: 59 | Attending: Emergency Medicine | Admitting: Emergency Medicine

## 2017-05-06 ENCOUNTER — Emergency Department (HOSPITAL_COMMUNITY): Payer: 59

## 2017-05-06 DIAGNOSIS — J069 Acute upper respiratory infection, unspecified: Secondary | ICD-10-CM | POA: Diagnosis not present

## 2017-05-06 DIAGNOSIS — Z87891 Personal history of nicotine dependence: Secondary | ICD-10-CM | POA: Insufficient documentation

## 2017-05-06 DIAGNOSIS — B9789 Other viral agents as the cause of diseases classified elsewhere: Secondary | ICD-10-CM | POA: Diagnosis not present

## 2017-05-06 DIAGNOSIS — L299 Pruritus, unspecified: Secondary | ICD-10-CM

## 2017-05-06 DIAGNOSIS — Z79899 Other long term (current) drug therapy: Secondary | ICD-10-CM | POA: Diagnosis not present

## 2017-05-06 DIAGNOSIS — E119 Type 2 diabetes mellitus without complications: Secondary | ICD-10-CM | POA: Diagnosis not present

## 2017-05-06 DIAGNOSIS — R21 Rash and other nonspecific skin eruption: Secondary | ICD-10-CM | POA: Diagnosis present

## 2017-05-06 DIAGNOSIS — Z7984 Long term (current) use of oral hypoglycemic drugs: Secondary | ICD-10-CM | POA: Insufficient documentation

## 2017-05-06 DIAGNOSIS — Z7902 Long term (current) use of antithrombotics/antiplatelets: Secondary | ICD-10-CM | POA: Insufficient documentation

## 2017-05-06 DIAGNOSIS — I10 Essential (primary) hypertension: Secondary | ICD-10-CM | POA: Diagnosis not present

## 2017-05-06 NOTE — ED Triage Notes (Signed)
Reports having chest congestion for a week.  Started having itching today to upper ext and back.  No rash seen in triage.  Three small "bite" like areas noted to left forearm.  Denies having any sob or cp just c/o productive cough.

## 2017-05-07 DIAGNOSIS — L299 Pruritus, unspecified: Secondary | ICD-10-CM | POA: Diagnosis not present

## 2017-05-07 MED ORDER — PROMETHAZINE-DM 6.25-15 MG/5ML PO SYRP: 5.0000 mL | ORAL_SOLUTION | Freq: Four times a day (QID) | ORAL | 0 refills | Status: DC | PRN

## 2017-05-07 MED ORDER — HYDROXYZINE HCL 25 MG PO TABS: 25.0000 mg | ORAL_TABLET | Freq: Four times a day (QID) | ORAL | 0 refills | Status: DC

## 2017-05-07 MED ORDER — HYDROXYZINE HCL 25 MG PO TABS
25.0000 mg | ORAL_TABLET | Freq: Once | ORAL | Status: AC
  Administered 2017-05-07: 25 mg via ORAL
  Filled 2017-05-07: qty 1

## 2017-05-07 NOTE — ED Provider Notes (Signed)
[facility] DEPT Provider Note   CSN: [id number] Arrival date & time: 05/06/17  2017     History   Chief Complaint Chief Complaint  Patient presents with  . chest congestion  . Rash    HPI Jessica Lawson is a 51 y.o. female with a h/o of DM who presents to the Emergency Department with a chief complaint of pruritus to her bilateral arms and upper chest that began this evening. The patient denies a rash, fever, chills. No itching to her abdomen, back, or legs. She reports no new soaps, lotions, detergents. No new foods, recent travel, or new medications. Last medication initiated was pravastatin 2 months ago. She denies abdominal pain, N/V/D, a h/o of thyroid problems, or risk factors for HIV. No sick contacts. She reports that she treated her symptoms by washing her arms with antibiotic soap which provided some relief from the itching.  She also complaints of chest congestion that began last week, which has improved, but she now feels congested in her nose and complains of a sore throat and a non-productive cough. She denies fever, chills, CP, dyspnea, otalgia, or sneezing. No treatment prior to arrival.   She reports that she is compliant with her metformin, but does not have a glucometer at home to check her blood sugar.    The history is provided by the patient. No language interpreter was used.    Past Medical History:  Diagnosis Date  . Back pain   . Diabetes mellitus without complication ([place])   . High blood cholesterol   . Hypertension   . Kidney stone   . Normal cardiac stress test 02/2016   low risk  . Obesity   . Pain of cheek    and right side neck  . Tobacco use   . UTI (lower urinary tract infection)   . Wrist pain    left wrist    Patient Active Problem List   Diagnosis Date Noted  . Herpes simplex type 1 infection 12/15/2016  . Chest pain 03/05/2016  . Acute bronchitis 03/05/2016  . Environmental allergies 10/26/2015  . Hypercholesterolemia with  hypertriglyceridemia 09/20/2015  . Hyperlipidemia 09/05/2015  . Right shoulder pain 09/05/2015  . Mouth ulcers 09/05/2015    Past Surgical History:  Procedure Laterality Date  . CESAREAN SECTION     3 c-sections    OB History    No data available       Home Medications    Prior to Admission medications   Medication Sig Start Date End Date Taking? Authorizing Provider  acetaminophen (TYLENOL) 325 MG tablet Take 2 tablets (650 mg total) by mouth every 6 (six) hours as needed. 06/23/16   Mesner, Corene Cornea, MD  albuterol (PROVENTIL HFA;VENTOLIN HFA) 108 (90 Base) MCG/ACT inhaler Inhale 2 puffs into the lungs every 4 (four) hours as needed for wheezing or shortness of breath. 12/15/16   Dorena Dew, FNP  busPIRone (BUSPAR) 5 MG tablet Take 1 tablet (5 mg total) by mouth 2 (two) times daily. 03/03/17   Dorena Dew, FNP  cetirizine (ZYRTEC) 10 MG tablet Take 1 tablet (10 mg total) by mouth daily. 12/15/16   Dorena Dew, FNP  Chlorphen-PE-Acetaminophen (NOREL AD) 4-10-325 MG TABS Take 1 tablet by mouth every 6 (six) hours as needed. 04/06/17   Dorena Dew, FNP  docusate sodium (COLACE) 100 MG capsule Take 1 capsule (100 mg total) by mouth daily. 08/22/16   Dorena Dew, FNP  gabapentin (NEURONTIN) 100 MG capsule  Take 1 capsule (100 mg total) by mouth 2 (two) times daily. 08/22/16   Dorena Dew, FNP  hydrOXYzine (ATARAX/VISTARIL) 25 MG tablet Take 1 tablet (25 mg total) by mouth every 6 (six) hours. 05/07/17   Sagal Gayton A, PA-C  metFORMIN (GLUCOPHAGE) 500 MG tablet Take 1 tablet (500 mg total) by mouth 2 (two) times daily with a meal. Reported on 09/03/2015 04/20/16   Micheline Chapman, NP  Multiple Vitamins-Minerals (MULTIVITAMIN WITH MINERALS) tablet Take 1 tablet by mouth daily.    [provider]  Olopatadine HCl 0.2 % SOLN Apply 1 drop to eye daily. Patient not taking: Reported on 03/03/2017 11/21/16   Dorena Dew, FNP  Omega-3 Fatty Acids (FISH OIL)  1000 MG CPDR Take 1,000 mg by mouth daily. 11/21/16   Dorena Dew, FNP  omeprazole (PRILOSEC) 20 MG capsule Take 1 capsule (20 mg total) by mouth daily. 03/03/17   Dorena Dew, FNP  pravastatin (PRAVACHOL) 20 MG tablet Take 1 tablet (20 mg total) by mouth daily. 03/03/17   Dorena Dew, FNP  promethazine-dextromethorphan (PROMETHAZINE-DM) 6.25-15 MG/5ML syrup Take 5 mLs by mouth 4 (four) times daily as needed for cough. 05/07/17   Trenda Corliss A, PA-C  valACYclovir (VALTREX) 1000 MG tablet Take 1 tablet (1,000 mg total) by mouth 2 (two) times daily. 12/15/16   Dorena Dew, FNP    Family History Family History  Problem Relation Age of Onset  . Hyperlipidemia Other   . Hypertension Other   . Diabetes Other   . Diabetes Mother   . Lung disease Father   . Diabetes Sister   . Diabetes Sister   . Hypertension Sister     Social History Social History  Substance Use Topics  . Smoking status: Former Smoker    Packs/day: 1.00    Types: Cigarettes    Quit date: 07/26/2016  . Smokeless tobacco: Never Used  . Alcohol use No     Allergies   Asa [aspirin]; Iohexol; Penicillins; and Sulfa antibiotics   Review of Systems Review of Systems  Constitutional: Negative for activity change, chills and fever.  HENT: Positive for congestion. Negative for ear pain, postnasal drip, sinus pain, sinus pressure and sore throat.   Respiratory: Positive for cough. Negative for shortness of breath.   Cardiovascular: Negative for chest pain.  Gastrointestinal: Negative for abdominal pain, diarrhea, nausea and vomiting.  Musculoskeletal: Negative for back pain.  Skin: Negative for rash.       Pruritus  Allergic/Immunologic: Positive for immunocompromised state.     Physical Exam Updated Vital Signs BP (!) 150/78   Pulse 74   Temp (!) 97.4 F (36.3 C) (Oral)   Resp 18   Ht 4\' 11"  (1.499 m)   Wt 83 kg (183 lb)   LMP 08/22/2011 Comment: patient states LMP 20 years ago  SpO2  100%   BMI 36.96 kg/m   Physical Exam  Constitutional: No distress.  HENT:  Head: Normocephalic.  Right Ear: Tympanic membrane normal.  Left Ear: Tympanic membrane normal.  Nose: Mucosal edema present. Right sinus exhibits no maxillary sinus tenderness and no frontal sinus tenderness. Left sinus exhibits no maxillary sinus tenderness and no frontal sinus tenderness.  Mouth/Throat: Uvula is midline, oropharynx is clear and moist and mucous membranes are normal. No oropharyngeal exudate, posterior oropharyngeal edema or posterior oropharyngeal erythema. No tonsillar exudate.  Eyes: Conjunctivae are normal.  Neck: Neck supple. No thyromegaly present.  Cardiovascular: Normal rate, regular rhythm and  normal heart sounds.  Exam reveals no gallop and no friction rub.   No murmur heard. Pulmonary/Chest: Effort normal and breath sounds normal. No respiratory distress. She has no wheezes. She has no rales. She exhibits no tenderness.  Abdominal: Soft. Bowel sounds are normal. She exhibits no distension and no mass. There is no tenderness. There is no rebound and no guarding. No hernia.  Neurological: She is alert.  Skin: Skin is warm. No rash noted.  Xeroderma noted to the bilateral upper extremities. Several linear marks consistent with scratching. No rash noted. There is some erythema noted over the arms, mostly in a linear pattern consistent with scratching.  Psychiatric: Her behavior is normal.  Nursing note and vitals reviewed.    ED Treatments / Results  Labs (all labs ordered are listed, but only abnormal results are displayed) Labs Reviewed  CBG MONITORING, ED    EKG  EKG Interpretation None       Radiology Dg Chest 2 View  Result Date: 05/06/2017 CLINICAL DATA:  Chest congestion. Productive cough for 1 week. Diabetes. Previous smoker. EXAM: CHEST  2 VIEW COMPARISON:  12/15/2016 FINDINGS: The heart size and mediastinal contours are within normal limits. Both lungs are clear.  The visualized skeletal structures are unremarkable. IMPRESSION: No active cardiopulmonary disease. Electronically Signed   By: Lucienne Capers M.D.   On: 05/06/2017 21:32    Procedures Procedures (including critical care time)  Medications Ordered in ED Medications  hydrOXYzine (ATARAX/VISTARIL) tablet 25 mg (25 mg Oral Given 05/07/17 0105)     Initial Impression / Assessment and Plan / ED Course  I have reviewed the triage vital signs and the nursing notes.  Pertinent labs & imaging results that were available during my care of the patient were reviewed by me and considered in my medical decision making (see chart for details).     51 year old female presenting with multiple complaints. She complains of chest congestion that has since become nasal congestion. Afebrile. Chest x-ray does not demonstrate an acute infiltrate. SaO2 100% on RA. Mild mucosal edema noted on exam. Will provide the patient with symptomatic relief for a URI with cough.  The patient also complains of acute onset pruritus that began earlier tonight the bilateral arms and anterior chest. No rash. She has a history of elevated cholesterol, but no underlying liver disease. She denies abdominal tenderness and her abdominal exam is unremarkable. No history of thyroid disease and her TSH was checked in May 2018. No thyromegaly palpated on physical exam. No risk factors for HIV. Given that the patient's symptoms started earlier this evening, will give her a dose of hydralazine in the ED. Xeroderma noted to the bilateral arms, which appears to be the most likely cause of her symptoms. She reports that she does not use any kind of moisturizer. She also reports that she has diabetes mellitus and has not been able to check her blood sugars at home. CBG 106 today. Discussed with the patient that if her symptoms do not start to improve in the next couple days to follow up with her primary care provider for an additional workup.  Encourage the patient to try an over-the-counter fragrance free moisturizer for her dry skin. Strict return precautions given. No acute distress. Patient is safe for discharge at this time.  Final Clinical Impressions(s) / ED Diagnoses   Final diagnoses:  Pruritus  Viral URI with cough    New Prescriptions Discharge Medication List as of 05/07/2017 12:38 AM  START taking these medications   Details  hydrOXYzine (ATARAX/VISTARIL) 25 MG tablet Take 1 tablet (25 mg total) by mouth every 6 (six) hours., Starting Sun 05/07/2017, Print    promethazine-dextromethorphan (PROMETHAZINE-DM) 6.25-15 MG/5ML syrup Take 5 mLs by mouth 4 (four) times daily as needed for cough., Starting Sun 05/07/2017, Print         Oaklie Durrett A, PA-C 25/49/82 [id number]    Delora Fuel, MD 83/09/40 [phone]

## 2017-05-07 NOTE — Discharge Instructions (Signed)
Atarax may be taken once every 6 hours to help with itching. If you were itching does not improve in the next 2-3 days, please call and schedule a follow-up appointment with your primary care provider.  You may take 5 mL of cough syrup to help with your congestion and cough. Continue to take your cetirizine every day.  If you have new or worsening symptoms, including fever, shortness of breath, vomiting, or abdominal pain, please return to the emergency department for reevaluation.

## 2017-05-08 LAB — CBG MONITORING, ED: Glucose-Capillary: 106 mg/dL — ABNORMAL HIGH (ref 65–99)

## 2017-05-23 ENCOUNTER — Ambulatory Visit: Payer: Medicaid Other | Admitting: Family Medicine

## 2017-07-12 ENCOUNTER — Ambulatory Visit (INDEPENDENT_AMBULATORY_CARE_PROVIDER_SITE_OTHER): Payer: 59 | Admitting: Family Medicine

## 2017-07-12 ENCOUNTER — Other Ambulatory Visit (HOSPITAL_COMMUNITY)
Admission: RE | Admit: 2017-07-12 | Discharge: 2017-07-12 | Disposition: A | Discharge status: Home / Self Care | Payer: Medicaid Other | Source: Ambulatory Visit | Attending: Family Medicine | Admitting: Family Medicine

## 2017-07-12 ENCOUNTER — Encounter: Payer: Self-pay | Admitting: Family Medicine

## 2017-07-12 VITALS — BP 131/59 | HR 67 | Temp 98.1°F | Resp 16 | Ht 59.0 in | Wt 188.0 lb

## 2017-07-12 DIAGNOSIS — R7303 Prediabetes: Secondary | ICD-10-CM | POA: Diagnosis present

## 2017-07-12 DIAGNOSIS — N898 Other specified noninflammatory disorders of vagina: Secondary | ICD-10-CM | POA: Diagnosis present

## 2017-07-12 DIAGNOSIS — E782 Mixed hyperlipidemia: Secondary | ICD-10-CM | POA: Diagnosis not present

## 2017-07-12 DIAGNOSIS — R635 Abnormal weight gain: Secondary | ICD-10-CM | POA: Diagnosis not present

## 2017-07-12 DIAGNOSIS — E119 Type 2 diabetes mellitus without complications: Secondary | ICD-10-CM | POA: Diagnosis not present

## 2017-07-12 LAB — LIPID PANEL
CHOLESTEROL: 270 mg/dL — AB (ref <200)
HDL: 37 mg/dL — ABNORMAL LOW (ref >50)
LDL Cholesterol (Calc): 183 mg/dL (calc) — ABNORMAL HIGH
Non-HDL Cholesterol (Calc): 233 mg/dL (calc) — ABNORMAL HIGH (ref <130)
Total CHOL/HDL Ratio: 7.3 (calc) — ABNORMAL HIGH (ref <5.0)
Triglycerides: 294 mg/dL — ABNORMAL HIGH (ref <150)

## 2017-07-12 LAB — POCT URINALYSIS DIP (DEVICE)
Bilirubin Urine: NEGATIVE
GLUCOSE, UA: NEGATIVE mg/dL
Ketones, ur: NEGATIVE mg/dL
LEUKOCYTES UA: NEGATIVE
NITRITE: NEGATIVE
PROTEIN: NEGATIVE mg/dL
Specific Gravity, Urine: 1.025 (ref 1.005–1.030)
UROBILINOGEN UA: 0.2 mg/dL (ref 0.0–1.0)
pH: 5.5 (ref 5.0–8.0)

## 2017-07-12 LAB — POCT GLYCOSYLATED HEMOGLOBIN (HGB A1C): HEMOGLOBIN A1C: 6.8

## 2017-07-12 NOTE — Progress Notes (Signed)
Chief Complaint  Patient presents with  . Hyperlipidemia  . Vaginal Pain    odor   Jessica Lawson, a 51 year old female with a history of prediabetes and hyperlipidemia presents for a 3 month follow up.   Patient is complaining about obesity and weight gain. She says that she has been gaining weight since starting pravastatin. Ms. Huyett has been exercising infrequently.  She typically cooks meals at home. She eats 2-3 meals per day. She does not control portions. She typically walks in the neighborhood several times per week.   Patient is complaining of vaginal discharge and vaginal odor for greater than 1 week.  She is not currently sexually active and has not recently switched partners.  She states that her vaginal discharge is thick and white and has a fishy odor.  She states that she has not attempted any over-the-counter interventions to alleviate current symptoms.  She denies fatigue, dysuria, dyspareunia, abdominal pain, or nausea.  Past Medical History:  Diagnosis Date  . Back pain   . Diabetes mellitus without complication ([place])   . High blood cholesterol   . Hypertension   . Kidney stone   . Normal cardiac stress test 02/2016   low risk  . Obesity   . Pain of cheek    and right side neck  . Tobacco use   . UTI (lower urinary tract infection)   . Wrist pain    left wrist   Social History   Socioeconomic History  . Marital status: Single    Spouse name: Not on file  . Number of children: Not on file  . Years of education: Not on file  . Highest education level: Not on file  Social Needs  . Financial resource strain: Not on file  . Food insecurity - worry: Not on file  . Food insecurity - inability: Not on file  . Transportation needs - medical: Not on file  . Transportation needs - non-medical: Not on file  Occupational History  . Not on file  Tobacco Use  . Smoking status: Former Smoker    Packs/day: 1.00    Types: Cigarettes    Last attempt to quit:  07/26/2016    Years since quitting: 0.9  . Smokeless tobacco: Never Used  Substance and Sexual Activity  . Alcohol use: No  . Drug use: No  . Sexual activity: Not on file  Other Topics Concern  . Not on file  Social History Narrative  . Not on file   Immunization History  Administered Date(s) Administered  . Pneumococcal Polysaccharide-23 09/03/2015  Review of Systems  Constitutional: Negative.        Weight gain  HENT: Negative for sinus pain and sore throat.   Eyes: Negative.   Respiratory: Negative.   Cardiovascular: Negative.   Gastrointestinal: Negative.  Negative for constipation and diarrhea.  Genitourinary: Negative.   Musculoskeletal: Negative.   Skin: Negative.   Neurological: Negative.   Endo/Heme/Allergies: Negative.   Psychiatric/Behavioral: Negative.   Physical Exam  Constitutional: She is oriented to person, place, and time and well-developed, well-nourished, and in no distress.  HENT:  Head: Normocephalic and atraumatic.  Right Ear: External ear normal. No drainage or swelling. No decreased hearing is noted.  Left Ear: External ear normal. No drainage or swelling. No decreased hearing is noted.  Nose: Nose normal. Right sinus exhibits no maxillary sinus tenderness and no frontal sinus tenderness. Left sinus exhibits no maxillary sinus tenderness and no frontal sinus tenderness.  Mouth/Throat: Oropharynx is clear and moist.  Eyes: Conjunctivae and EOM are normal. Pupils are equal, round, and reactive to light.  Neck: Normal range of motion. Neck supple.  Cardiovascular: Normal rate, regular rhythm, normal heart sounds and intact distal pulses.  Pulmonary/Chest: Effort normal and breath sounds normal.  Abdominal: Soft. Bowel sounds are normal.  Increased abdominal fat  Musculoskeletal: Normal range of motion.  Neurological: She is alert and oriented to person, place, and time. She has normal reflexes. Gait normal. GCS score is 15.  Skin: Skin is warm and  dry.  Psychiatric: Mood, affect and judgment normal.   BP (!) 131/59 (BP Location: Left Arm, Patient Position: Sitting, Cuff Size: Normal)   Pulse 67   Temp 98.1 F (36.7 C) (Oral)   Resp 16   Ht 4\' 11"  (1.499 m)   Wt 188 lb (85.3 kg)   LMP 08/22/2011 Comment: patient states LMP 20 years ago  SpO2 97%   BMI 37.97 kg/m   1. Prediabetes Hemoglobin A1c is increased to 6.8 from 6.2, which is consistent with type 2 diabetes mellitus.  We will continue metformin 500 mg twice daily.  Discussed carbohydrate modify diet at length.  We will also send a referral to diabetes education for further assistance with meal planning. - HgB A1c  2. Hypercholesterolemia with hypertriglyceridemia The 10-year ASCVD risk score [name], et al., 2013) is: 9.8%   Values used to calculate the score:     Age: 51 years     Sex: Female     Is Non-Hispanic African American: Yes     Diabetic: Yes     Tobacco smoker: No     Systolic Blood Pressure: 449 mmHg     Is BP treated: No     HDL Cholesterol: 41 mg/dL     Total Cholesterol: 250 mg/dL - Lipid Panel  3. Vaginal discharge - Cervicovaginal ancillary only  4. Vaginal odor - Cervicovaginal ancillary only  5.  Weight gain Recommend a lowfat, low carbohydrate diet divided over 5-6 small meals, increase water intake to 6-8 glasses, and 150 minutes per week of cardiovascular exercise.  6.  Type 2 diabetes mellitus, controlled We will continue metformin 500 mg twice daily with meals -Ambulatory referral to diabetes education   RTC: 3 months for DMII and hyperlipidemia    [name]  MSN, FNP-C [facility] [address] [place], [place] [address] [phone]

## 2017-07-12 NOTE — Patient Instructions (Signed)
We will follow-up by phone with any abnormal lab results. Due to weight gain, will start the following diet: We will start a carbohydrate modify diet that includes for breakfast 2 slices of bacon and one boiled egg, snack handful of almonds, lunch small salad with low-fat dressing, snack high-protein (TUNA, cottage cheese, protein shake, or full fat [place] yogurt), and dinner meat and full veggies.  Recommend a lowfat, low carbohydrate diet divided over 5-6 small meals, increase water intake to 6-8 glasses, and 150 minutes per week of cardiovascular exercise.

## 2017-07-13 LAB — CERVICOVAGINAL ANCILLARY ONLY
BACTERIAL VAGINITIS: NEGATIVE
Candida vaginitis: POSITIVE — AB
Trichomonas: NEGATIVE

## 2017-07-15 ENCOUNTER — Other Ambulatory Visit: Payer: Self-pay | Admitting: Family Medicine

## 2017-07-15 DIAGNOSIS — E785 Hyperlipidemia, unspecified: Secondary | ICD-10-CM

## 2017-07-15 MED ORDER — PRAVASTATIN SODIUM 40 MG PO TABS: 40.0000 mg | ORAL_TABLET | Freq: Every day | ORAL | 3 refills | Status: DC

## 2017-07-15 NOTE — Progress Notes (Signed)
Meds ordered this encounter  Medications  . pravastatin (PRAVACHOL) 40 MG tablet    Sig: Take 1 tablet (40 mg total) by mouth daily.    Dispense:  90 tablet    Refill:  [place]  MSN, FNP-C [facility] [address] [place], [place] [address] [phone]

## 2017-07-18 ENCOUNTER — Telehealth: Payer: Self-pay

## 2017-07-18 MED ORDER — FLUCONAZOLE 150 MG PO TABS: 150.0000 mg | ORAL_TABLET | Freq: Once | ORAL | 0 refills | Status: AC

## 2017-07-18 NOTE — Telephone Encounter (Signed)
Called and spoke with patient. Advised that cholesterol is 270 and the goal is to be under 200. Advised that we are increasing the pravastatin to 40mg  every evening with dinner. Recommenced that she do a lowfat/ low carbohydrate diet over 5 to 6 small meals daily. Advised to increase water intake to 6 to 8 glasses daily and exercise 150 minutes a week of cardio.   Patient inquired about vaginal swab she provided. I spoke with NP Lavell Anchors, since [place] is out of office today. Kim advised me to tell patient she had a yeast infection and that we will send in Diflucan 150mg  as a single dose. Asked if this doesn't clear up in one week to call back or return to office. Patient verbalized understanding. Thanks!

## 2017-07-18 NOTE — Telephone Encounter (Signed)
-----   Message from Dorena Dew,  sent at 07/15/2017  6:58 AM EST ----- Regarding: lab results Please inform [name] that total cholesterol is 270, goal is <200 and LDL cholesterol is 183, goal is <100. Increased pravastatin to 40 mg every evening with dinner. Recommend a lowfat, low carbohydrate diet divided over 5-6 small meals, increase water intake to 6-8 glasses, and 150 minutes per week of cardiovascular exercise.   Thanks

## 2017-07-27 ENCOUNTER — Ambulatory Visit: Payer: 59 | Admitting: Skilled Nursing Facility1

## 2017-10-13 ENCOUNTER — Other Ambulatory Visit: Payer: Self-pay | Admitting: Family Medicine

## 2017-10-13 DIAGNOSIS — N6310 Unspecified lump in the right breast, unspecified quadrant: Secondary | ICD-10-CM

## 2017-10-23 ENCOUNTER — Ambulatory Visit
Admission: RE | Admit: 2017-10-23 | Discharge: 2017-10-23 | Disposition: A | Discharge status: Home / Self Care | Payer: 59 | Source: Ambulatory Visit | Attending: Family Medicine | Admitting: Family Medicine

## 2017-10-23 ENCOUNTER — Ambulatory Visit: Payer: 59

## 2017-10-23 DIAGNOSIS — N6310 Unspecified lump in the right breast, unspecified quadrant: Secondary | ICD-10-CM

## 2017-10-27 ENCOUNTER — Ambulatory Visit (INDEPENDENT_AMBULATORY_CARE_PROVIDER_SITE_OTHER): Payer: 59 | Admitting: Family Medicine

## 2017-10-27 VITALS — BP 160/86 | HR 87 | Temp 97.7°F | Resp 16 | Ht 59.0 in | Wt 189.0 lb

## 2017-10-27 DIAGNOSIS — R221 Localized swelling, mass and lump, neck: Secondary | ICD-10-CM | POA: Diagnosis not present

## 2017-10-27 DIAGNOSIS — E785 Hyperlipidemia, unspecified: Secondary | ICD-10-CM

## 2017-10-27 DIAGNOSIS — E119 Type 2 diabetes mellitus without complications: Secondary | ICD-10-CM

## 2017-10-27 DIAGNOSIS — I10 Essential (primary) hypertension: Secondary | ICD-10-CM | POA: Diagnosis not present

## 2017-10-27 DIAGNOSIS — E669 Obesity, unspecified: Secondary | ICD-10-CM | POA: Diagnosis not present

## 2017-10-27 LAB — POCT URINALYSIS DIP (DEVICE)
Bilirubin Urine: NEGATIVE
GLUCOSE, UA: NEGATIVE mg/dL
Ketones, ur: NEGATIVE mg/dL
Leukocytes, UA: NEGATIVE
Nitrite: NEGATIVE
Protein, ur: 30 mg/dL — AB
UROBILINOGEN UA: 0.2 mg/dL (ref 0.0–1.0)
pH: 5.5 (ref 5.0–8.0)

## 2017-10-27 MED ORDER — HYDROCHLOROTHIAZIDE 12.5 MG PO TABS: 12.5000 mg | ORAL_TABLET | Freq: Every day | ORAL | 1 refills | Status: DC

## 2017-10-27 MED ORDER — FENOFIBRATE 145 MG PO TABS: 145.0000 mg | ORAL_TABLET | Freq: Every day | ORAL | 11 refills | Status: DC

## 2017-10-27 NOTE — Patient Instructions (Signed)
Ms. Couts, your cholesterol was elevated in November. We discussed that at length. We will discontinue pravastatin due to unwanted side effects. Will start a trial of Tricor 145 mg every evening with dinner.   Your blood pressure is elevated, will start a trial of hydrochlorothiazide. Also,  monitor blood pressure at home. Continue DASH diet. Reminder to go to the ER if any CP, SOB, nausea, dizziness, severe HA, changes vision/speech, left arm numbness and tingling and jaw pain.    Cholesterol Cholesterol is a fat. Your body needs a small amount of cholesterol. Cholesterol (plaque) may build up in your blood vessels (arteries). That makes you more likely to have a heart attack or stroke. You cannot feel your cholesterol level. Having a blood test is the only way to find out if your level is high. Keep your test results. Work with your doctor to keep your cholesterol at a good level. What do the results mean?  Total cholesterol is how much cholesterol is in your blood.  LDL is bad cholesterol. This is the type that can build up. Try to have low LDL.  HDL is good cholesterol. It cleans your blood vessels and carries LDL away. Try to have high HDL.  Triglycerides are fat that the body can store or burn for energy. What are good levels of cholesterol?  Total cholesterol below 200.  LDL below 100 is good for people who have health risks. LDL below 70 is good for people who have very high risks.  HDL above 40 is good. It is best to have HDL of 60 or higher.  Triglycerides below 150. How can I lower my cholesterol? Diet Follow your diet program as told by your doctor.  Choose fish, white meat chicken, or [place] that is roasted or baked. Try not to eat red meat, fried foods, sausage, or lunch meats.  Eat lots of fresh fruits and vegetables.  Choose whole grains, beans, pasta, potatoes, and cereals.  Choose olive oil, corn oil, or canola oil. Only use small amounts.  Try not to eat  butter, mayonnaise, shortening, or palm kernel oils.  Try not to eat foods with trans fats.  Choose low-fat or nonfat dairy foods. ? Drink skim or nonfat milk. ? Eat low-fat or nonfat yogurt and cheeses. ? Try not to drink whole milk or cream. ? Try not to eat ice cream, egg yolks, or full-fat cheeses.  Healthy desserts include angel food cake, ginger snaps, animal crackers, hard candy, popsicles, and low-fat or nonfat frozen yogurt. Try not to eat pastries, cakes, pies, and cookies.  Exercise Follow your exercise program as told by your doctor.  Be more active. Try gardening, walking, and taking the stairs.  Ask your doctor about ways that you can be more active.  Medicine  Take over-the-counter and prescription medicines only as told by your doctor. This information is not intended to replace advice given to you by your health care provider. Make sure you discuss any questions you have with your health care provider. Document Released: 10/28/2008 Document Revised: 03/02/2016 Document Reviewed: 02/11/2016 Elsevier Interactive Patient Education  [employer].

## 2017-10-28 LAB — TSH: TSH: 0.793 u[IU]/mL (ref 0.450–4.500)

## 2017-10-30 ENCOUNTER — Telehealth: Payer: Self-pay

## 2017-10-30 NOTE — Telephone Encounter (Signed)
Called and left a message advising that thyroid panel is within normal range. Advised that we have stopped the pravastatin and to start tricor 145mg  every evening with dinner. Recommended that she eat a low fat/low carb deit over 5 to 6 small meals daily, drink 6 to 8 glasses of water daily and exercise 150 minutes weekly. Thanks~ !

## 2017-10-30 NOTE — Telephone Encounter (Signed)
-----   Message from Jessica Lawson, [place] sent at 10/28/2017 11:50 AM EDT ----- Regarding: lab results Please inform [name] that thyroid panel is within a normal range. Start trial of Tricor 145 mg every evening with dinner. Remind her that pravastatin was discontinued. Recommend a lowfat, low carbohydrate diet divided over 5-6 small meals, increase water intake to 6-8 glasses, and 150 minutes per week of cardiovascular exercise.   [name]  MSN, FNP-C [facility] [address] [place], [place] [address] [phone]

## 2017-10-31 ENCOUNTER — Ambulatory Visit (INDEPENDENT_AMBULATORY_CARE_PROVIDER_SITE_OTHER): Payer: 59

## 2017-10-31 VITALS — BP 138/88 | HR 80

## 2017-10-31 DIAGNOSIS — Z0130 Encounter for examination of blood pressure without abnormal findings: Secondary | ICD-10-CM

## 2017-10-31 NOTE — Progress Notes (Signed)
Patient came in for bp check today. Manually her bp is 138/88 and pulse is 80. I advised her to keep taking hctz as directed and keep next scheduled appointment. Her dental form was filled out and returned to her. Thanks!

## 2017-11-01 ENCOUNTER — Encounter: Payer: Self-pay | Admitting: Family Medicine

## 2017-11-01 ENCOUNTER — Other Ambulatory Visit: Payer: Self-pay | Admitting: Family Medicine

## 2017-11-01 DIAGNOSIS — E669 Obesity, unspecified: Secondary | ICD-10-CM | POA: Insufficient documentation

## 2017-11-01 DIAGNOSIS — I10 Essential (primary) hypertension: Secondary | ICD-10-CM | POA: Insufficient documentation

## 2017-11-01 DIAGNOSIS — E785 Hyperlipidemia, unspecified: Secondary | ICD-10-CM

## 2017-11-01 MED ORDER — FENOFIBRATE 48 MG PO TABS: 48.0000 mg | ORAL_TABLET | Freq: Every day | ORAL | 5 refills | Status: DC

## 2017-11-01 NOTE — Progress Notes (Signed)
Subjective:    Patient ID: Jessica Lawson, female    DOB: 1965-09-08, 52 y.o.   MRN: [id number]  HPI Jessica Lawson, a 52 year old female with a history of hyperlipidemia, obesity, and type 2 diabetes mellitus presents for evaluation of high blood pressure. Patient was scheduled for dental procedure 1 week ago, which was unable to be performed due to elevated blood pressure. Patient is not on antihypertensive medications. She reports a family history of hypertension.She denies dizziness, blurred vision, syncope, chest pain, heart palpitations, or lower extremity edema.    She has a history of hyperlipidemia and has been taking medication consistently. She is complaining weight gain due to taking pravastatin. [name] has not been exercising routinely or following a low fat diet. Body mass index is 38.17 kg/m.Patient's weight has been increasing over the past several months.   [name] also has a history of type 2 diabetes mellitus. Diabetes has been controlled on Metformin 500 mg BID. Most recent hemoglobin a1C is 6.8, which is at goal. Patient currently denies polyuria, polydipsia, or polyphagia.   Past Medical History:  Diagnosis Date  . Back pain   . Diabetes mellitus without complication ([place])   . High blood cholesterol   . Hypertension   . Kidney stone   . Normal cardiac stress test 02/2016   low risk  . Obesity   . Pain of cheek    and right side neck  . Tobacco use   . UTI (lower urinary tract infection)   . Wrist pain    left wrist   Social History   Socioeconomic History  . Marital status: Single    Spouse name: Not on file  . Number of children: Not on file  . Years of education: Not on file  . Highest education level: Not on file  Social Needs  . Financial resource strain: Not on file  . Food insecurity - worry: Not on file  . Food insecurity - inability: Not on file  . Transportation needs - medical: Not on file  . Transportation needs - non-medical: Not  on file  Occupational History  . Not on file  Tobacco Use  . Smoking status: Former Smoker    Packs/day: 1.00    Types: Cigarettes    Last attempt to quit: 07/26/2016    Years since quitting: 1.2  . Smokeless tobacco: Never Used  Substance and Sexual Activity  . Alcohol use: No  . Drug use: No  . Sexual activity: Not on file  Other Topics Concern  . Not on file  Social History Narrative  . Not on file   Immunization History  Administered Date(s) Administered  . Pneumococcal Polysaccharide-23 09/03/2015     Review of Systems  Constitutional: Positive for unexpected weight change (weight gain).  HENT: Negative.   Eyes: Negative.   Respiratory: Negative.   Cardiovascular: Negative.  Negative for chest pain, palpitations and leg swelling.  Gastrointestinal: Negative.   Endocrine: Negative.   Genitourinary: Negative.   Musculoskeletal: Negative.   Skin: Negative.   Allergic/Immunologic: Negative.   Neurological: Negative.   Hematological: Negative.        Objective:   Physical Exam  Constitutional: She is oriented to person, place, and time.  HENT:  Head: Normocephalic and atraumatic.  Right Ear: External ear normal.  Left Ear: External ear normal.  Nose: Nose normal.  Mouth/Throat: Oropharynx is clear and moist.  Eyes: Pupils are equal, round, and reactive to light.  Neck: Normal  range of motion. Neck supple.  Cardiovascular: Normal rate, regular rhythm, normal heart sounds and intact distal pulses.  Pulmonary/Chest: Effort normal and breath sounds normal.  Abdominal: Soft. Bowel sounds are normal.  Musculoskeletal: Normal range of motion.  Neurological: She is alert and oriented to person, place, and time. She has normal reflexes.  Skin: Skin is warm and dry.  Psychiatric: She has a normal mood and affect. Her behavior is normal. Judgment and thought content normal.      BP (!) 160/86 Comment: manually  Pulse 87   Temp 97.7 F (36.5 C) (Oral)   Resp 16    Ht 4\' 11"  (1.499 m)   Wt 189 lb (85.7 kg)   LMP 08/22/2011 Comment: patient states LMP 20 years ago  SpO2 99%   BMI 38.17 kg/m  Assessment & Plan:  1. Essential hypertension Blood pressure is elevated on today. Reviewed dental report and blood pressure was 180/72 during visit. Will start a trial of thiazide diuretic and will return in 1 week for bp check.  Will review renal functioning as results become available.  - hydrochlorothiazide (HYDRODIURIL) 12.5 MG tablet; Take 1 tablet (12.5 mg total) by mouth daily.  Dispense: 90 tablet; Refill: 1 - EKG 12-Lead  2. Hyperlipidemia, unspecified hyperlipidemia type The 10-year ASCVD risk score [name], et al., 2013) is: 24%   Values used to calculate the score:     Age: 61 years     Sex: Female     Is Non-Hispanic African American: Yes     Diabetic: Yes     Tobacco smoker: No     Systolic Blood Pressure: 993 mmHg     Is BP treated: Yes     HDL Cholesterol: 37 mg/dL     Total Cholesterol: 270 mg/dL  - fenofibrate (TRICOR) 145 MG tablet; Take 1 tablet (145 mg total) by mouth daily.  Dispense: 30 tablet; Refill: 11 - Lipid Panel; Future  3. Type 2 diabetes mellitus without complication, without long-term current use of insulin (HCC) Will continue Metformin 500 mg BID.  - POCT urinalysis dip (device)  4. Neck fullness - TSH  5. Obesity (BMI 30-39.9) Body mass index is 38.17 kg/m. The patient is asked to make an attempt to improve diet and exercise patterns to aid in medical management of this problem. Recommend a lowfat, low carbohydrate diet divided over 5-6 small meals, increase water intake to 6-8 glasses, and 150 minutes per week of cardiovascular exercise.      RTC: Will follow up in 1 week for bp check  [name]  MSN, FNP-C [facility] [address] [place], [place] [address] [phone]

## 2017-11-01 NOTE — Progress Notes (Signed)
Meds ordered this encounter  Medications  . fenofibrate (TRICOR) 48 MG tablet    Sig: Take 1 tablet (48 mg total) by mouth daily.    Dispense:  30 tablet    Refill:  Harvey  MSN, FNP-C [facility] [place] [address] [place], [place] [address] [phone]

## 2017-12-03 ENCOUNTER — Other Ambulatory Visit: Payer: Self-pay | Admitting: Family Medicine

## 2017-12-03 DIAGNOSIS — E785 Hyperlipidemia, unspecified: Secondary | ICD-10-CM

## 2017-12-07 ENCOUNTER — Telehealth: Payer: Self-pay

## 2017-12-07 DIAGNOSIS — E785 Hyperlipidemia, unspecified: Secondary | ICD-10-CM

## 2017-12-07 DIAGNOSIS — Z9109 Other allergy status, other than to drugs and biological substances: Secondary | ICD-10-CM

## 2017-12-07 MED ORDER — CETIRIZINE HCL 10 MG PO TABS: 10.0000 mg | ORAL_TABLET | Freq: Every day | ORAL | 3 refills | Status: DC

## 2017-12-07 MED ORDER — OMEGA-3-ACID ETHYL ESTERS 1 G PO CAPS: 1.0000 | ORAL_CAPSULE | Freq: Every day | ORAL | 3 refills | Status: DC

## 2017-12-07 NOTE — Telephone Encounter (Signed)
Refills sent into pharmacy. Thanks!  

## 2018-01-02 ENCOUNTER — Emergency Department (HOSPITAL_COMMUNITY)
Admission: EM | Admit: 2018-01-02 | Discharge: 2018-01-03 | Disposition: A | Discharge status: Home / Self Care | Payer: Medicaid Other | Attending: Emergency Medicine | Admitting: Emergency Medicine

## 2018-01-02 DIAGNOSIS — Z87891 Personal history of nicotine dependence: Secondary | ICD-10-CM | POA: Diagnosis not present

## 2018-01-02 DIAGNOSIS — Z79899 Other long term (current) drug therapy: Secondary | ICD-10-CM | POA: Insufficient documentation

## 2018-01-02 DIAGNOSIS — I10 Essential (primary) hypertension: Secondary | ICD-10-CM | POA: Insufficient documentation

## 2018-01-02 DIAGNOSIS — E119 Type 2 diabetes mellitus without complications: Secondary | ICD-10-CM | POA: Insufficient documentation

## 2018-01-02 DIAGNOSIS — R42 Dizziness and giddiness: Secondary | ICD-10-CM | POA: Diagnosis present

## 2018-01-03 ENCOUNTER — Encounter (HOSPITAL_COMMUNITY): Payer: Self-pay | Admitting: Emergency Medicine

## 2018-01-03 ENCOUNTER — Other Ambulatory Visit: Payer: Self-pay

## 2018-01-03 ENCOUNTER — Emergency Department (HOSPITAL_COMMUNITY): Payer: Medicaid Other

## 2018-01-03 LAB — BASIC METABOLIC PANEL
ANION GAP: 10 (ref 5–15)
BUN: 14 mg/dL (ref 6–20)
CO2: 28 mmol/L (ref 22–32)
CREATININE: 0.83 mg/dL (ref 0.44–1.00)
Calcium: 9.6 mg/dL (ref 8.9–10.3)
Chloride: 100 mmol/L — ABNORMAL LOW (ref 101–111)
GLUCOSE: 172 mg/dL — AB (ref 65–99)
Potassium: 3.2 mmol/L — ABNORMAL LOW (ref 3.5–5.1)
Sodium: 138 mmol/L (ref 135–145)

## 2018-01-03 LAB — CBC
HCT: 42.9 % (ref 36.0–46.0)
HEMOGLOBIN: 14.5 g/dL (ref 12.0–15.0)
MCH: 27.6 pg (ref 26.0–34.0)
MCHC: 33.8 g/dL (ref 30.0–36.0)
MCV: 81.7 fL (ref 78.0–100.0)
PLATELETS: 386 10*3/uL (ref 150–400)
RBC: 5.25 MIL/uL — AB (ref 3.87–5.11)
RDW: 13 % (ref 11.5–15.5)
WBC: 7.2 10*3/uL (ref 4.0–10.5)

## 2018-01-03 LAB — I-STAT BETA HCG BLOOD, ED (MC, WL, AP ONLY): I-stat hCG, quantitative: <5 m[IU]/mL (ref <5)

## 2018-01-03 LAB — I-STAT TROPONIN, ED: TROPONIN I, POC: 0 ng/mL (ref 0.00–0.08)

## 2018-01-03 NOTE — Discharge Instructions (Signed)
Follow up with PCP.  Return for chest pain, shortness of breath, weakness of your arms or legs, slurred speech or trouble swallowing.

## 2018-01-03 NOTE — ED Provider Notes (Signed)
[facility] EMERGENCY DEPARTMENT Provider Note   CSN: [id number] Arrival date & time: 01/02/18  2306     History   Chief Complaint Chief Complaint  Patient presents with  . Hypertension  . Chills    HPI Jessica Lawson is a 52 y.o. female.  59 yoF with a chief complaint of lightheadedness.  This been going on for the past day.  When this happened in the grocery store she checked her blood pressure and noted that it was high.  She then checked it multiple times throughout the day and relies it is been persistently high.  As high as 200/110.  She then decided to come to the emergency department.  Her lightheadedness is come and gone.  She no longer has it.  She denied overt headache.  Denies chest pain or shortness of breath.  Denies unilateral numbness or weakness denies difficulty with swallowing or speech.  Denies lower extremity edema.  The history is provided by the patient.  Illness  This is a new problem. The current episode started 2 days ago. The problem occurs constantly. The problem has not changed since onset.Pertinent negatives include no chest pain, no headaches and no shortness of breath. Nothing aggravates the symptoms. Nothing relieves the symptoms. She has tried nothing for the symptoms. The treatment provided no relief.    Past Medical History:  Diagnosis Date  . Back pain   . Diabetes mellitus without complication ([place])   . High blood cholesterol   . Hypertension   . Kidney stone   . Normal cardiac stress test 02/2016   low risk  . Obesity   . Pain of cheek    and right side neck  . Tobacco use   . UTI (lower urinary tract infection)   . Wrist pain    left wrist    Patient Active Problem List   Diagnosis Date Noted  . Essential hypertension 11/01/2017  . Obesity (BMI 30-39.9) 11/01/2017  . Herpes simplex type 1 infection 12/15/2016  . Chest pain 03/05/2016  . Acute bronchitis 03/05/2016  . Environmental allergies 10/26/2015  .  Hypercholesterolemia with hypertriglyceridemia 09/20/2015  . Hyperlipidemia 09/05/2015  . Right shoulder pain 09/05/2015  . Mouth ulcers 09/05/2015    Past Surgical History:  Procedure Laterality Date  . CESAREAN SECTION     3 c-sections     OB History   None      Home Medications    Prior to Admission medications   Medication Sig Start Date End Date Taking? Authorizing Provider  albuterol (PROVENTIL HFA;VENTOLIN HFA) 108 (90 Base) MCG/ACT inhaler Inhale 2 puffs into the lungs every 4 (four) hours as needed for wheezing or shortness of breath. 12/15/16  Yes Dorena Dew, FNP  cetirizine (ZYRTEC) 10 MG tablet Take 1 tablet (10 mg total) by mouth daily. 12/07/17  Yes Dorena Dew, FNP  fenofibrate (TRICOR) 48 MG tablet Take 1 tablet (48 mg total) by mouth daily. 11/01/17 11/01/18 Yes Dorena Dew, FNP  hydrochlorothiazide (HYDRODIURIL) 12.5 MG tablet Take 1 tablet (12.5 mg total) by mouth daily. 10/27/17  Yes Dorena Dew, FNP  Multiple Vitamins-Minerals (MULTIVITAMIN WITH MINERALS) tablet Take 1 tablet by mouth daily.   Yes [provider]  omega-3 acid ethyl esters (LOVAZA) 1 g capsule Take 1 capsule (1 g total) by mouth daily. 12/07/17  Yes Dorena Dew, FNP  acetaminophen (TYLENOL) 325 MG tablet Take 2 tablets (650 mg total) by mouth every 6 (six) hours  as needed. Patient not taking: Reported on 01/03/2018 06/23/16   Mesner, Corene Cornea, MD  busPIRone (BUSPAR) 5 MG tablet Take 1 tablet (5 mg total) by mouth 2 (two) times daily. Patient not taking: Reported on 01/03/2018 03/03/17   Dorena Dew, FNP  docusate sodium (COLACE) 100 MG capsule Take 1 capsule (100 mg total) by mouth daily. Patient not taking: Reported on 01/03/2018 08/22/16   Dorena Dew, FNP  gabapentin (NEURONTIN) 100 MG capsule Take 1 capsule (100 mg total) by mouth 2 (two) times daily. Patient not taking: Reported on 01/03/2018 08/22/16   Dorena Dew, FNP  hydrOXYzine (ATARAX/VISTARIL)  25 MG tablet Take 1 tablet (25 mg total) by mouth every 6 (six) hours. Patient not taking: Reported on 01/03/2018 05/07/17   McDonald, Maree Erie A, PA-C  metFORMIN (GLUCOPHAGE) 500 MG tablet Take 1 tablet (500 mg total) by mouth 2 (two) times daily with a meal. Reported on 09/03/2015 Patient not taking: Reported on 01/03/2018 04/20/16   Micheline Chapman, NP  valACYclovir (VALTREX) 1000 MG tablet Take 1 tablet (1,000 mg total) by mouth 2 (two) times daily. Patient not taking: Reported on 01/03/2018 12/15/16   Dorena Dew, FNP    Family History Family History  Problem Relation Age of Onset  . Hyperlipidemia Other   . Hypertension Other   . Diabetes Other   . Diabetes Mother   . Lung disease Father   . Diabetes Sister   . Diabetes Sister   . Hypertension Sister     Social History Social History   Tobacco Use  . Smoking status: Former Smoker    Packs/day: 1.00    Types: Cigarettes    Last attempt to quit: 07/26/2016    Years since quitting: 1.4  . Smokeless tobacco: Never Used  Substance Use Topics  . Alcohol use: No  . Drug use: No     Allergies   Asa [aspirin]; Iohexol; Penicillins; and Sulfa antibiotics   Review of Systems Review of Systems  Constitutional: Negative for chills and fever.  HENT: Negative for congestion and rhinorrhea.   Eyes: Negative for redness and visual disturbance.  Respiratory: Negative for shortness of breath and wheezing.   Cardiovascular: Negative for chest pain and palpitations.  Gastrointestinal: Negative for nausea and vomiting.  Genitourinary: Negative for dysuria and urgency.  Musculoskeletal: Negative for arthralgias and myalgias.  Skin: Negative for pallor and wound.  Neurological: Positive for light-headedness. Negative for dizziness and headaches.     Physical Exam Updated Vital Signs BP (!) 198/73 (BP Location: Right Arm)   Pulse 77   Temp 98.2 F (36.8 C) (Oral)   Resp 18   Ht 4\' 11"  (1.499 m)   Wt 83 kg (183 lb)   LMP  08/22/2011 Comment: patient states LMP 20 years ago  SpO2 100%   BMI 36.96 kg/m   Physical Exam  Constitutional: She is oriented to person, place, and time. She appears well-developed and well-nourished. No distress.  HENT:  Head: Normocephalic and atraumatic.  Eyes: Pupils are equal, round, and reactive to light. EOM are normal.  Neck: Normal range of motion. Neck supple.  Cardiovascular: Normal rate and regular rhythm. Exam reveals no gallop and no friction rub.  No murmur heard. Pulmonary/Chest: Effort normal. She has no wheezes. She has no rales.  Abdominal: Soft. She exhibits no distension. There is no tenderness.  Musculoskeletal: She exhibits no edema or tenderness.  Neurological: She is alert and oriented to person, place, and time. She has  normal strength. No cranial nerve deficit or sensory deficit. She displays a negative Romberg sign. Coordination and gait normal. GCS eye subscore is 4. GCS verbal subscore is 5. GCS motor subscore is 6.  Benign neuro exam  Skin: Skin is warm and dry. She is not diaphoretic.  Psychiatric: She has a normal mood and affect. Her behavior is normal.  Nursing note and vitals reviewed.    ED Treatments / Results  Labs (all labs ordered are listed, but only abnormal results are displayed) Labs Reviewed  BASIC METABOLIC PANEL - Abnormal; Notable for the following components:      Result Value   Potassium 3.2 (*)    Chloride 100 (*)    Glucose, Bld 172 (*)    All other components within normal limits  CBC - Abnormal; Notable for the following components:   RBC 5.25 (*)    All other components within normal limits  I-STAT TROPONIN, ED  I-STAT BETA HCG BLOOD, ED ([facility], [facility], [facility] ONLY)    EKG EKG Interpretation  Date/Time:  Wednesday Jan 03 2018 00:15:08 EDT Ventricular Rate:  76 PR Interval:  148 QRS Duration: 86 QT Interval:  390 QTC Calculation: 438 R Axis:   49 Text Interpretation:  Normal sinus rhythm with sinus arrhythmia  Nonspecific T wave abnormality Abnormal ECG No significant change since last tracing Confirmed by Deno Etienne [phone]) on 01/03/2018 2:22:12 AM   Radiology Dg Chest 2 View  Result Date: 01/03/2018 CLINICAL DATA:  Hypertension.  Chest pain and shortness of breath. EXAM: CHEST - 2 VIEW COMPARISON:  05/06/2017 FINDINGS: The cardiomediastinal contours are normal. The lungs are clear. Pulmonary vasculature is normal. No consolidation, pleural effusion, or pneumothorax. No acute osseous abnormalities are seen. Overlying EKG leads in place. IMPRESSION: No acute findings. Electronically Signed   By: Jeb Levering M.D.   On: 01/03/2018 00:38    Procedures Procedures (including critical care time)  Medications Ordered in ED Medications - No data to display   Initial Impression / Assessment and Plan / ED Course  I have reviewed the triage vital signs and the nursing notes.  Pertinent labs & imaging results that were available during my care of the patient were reviewed by me and considered in my medical decision making (see chart for details).     52 yo F with a chief complaint of hypertension.  Her symptoms with this had resolved for some time.  She remains hypertensive.  She had labs performed in triage that were unremarkable.  Troponin negative EKG with no changes.  Chest x-ray reviewed by me with no widened mediastinum no edema.  We will have her follow-up with her PCP.  Patient asymptomatic with no noted s/s of end organ damage.  No chest pain, diaphoresis, nausea or other acs symptoms.  No headache or neurologic complaints,  no unequal pulses, normal pulse ox without rales or sob.  Feel this is unlikely to be a Hypertensive Emergency and recent studies suggest no benefit for inpatient admission.  There are also no studies to my knowledge suggesting that patients with hypertensive urgency have increased risk for end organ disease.The patient will follow up closely with their PCP.  Compliance with  their medication stressed.    Lowell Guitar, Cicero Duck [facility], et al. Characteristics and outcomes of patients presenting with hypertensive urgency in the office setting. JAMA Intern Med. 2016 Jul 1; 176(7): 981-8.   2:22 AM:  I have discussed the diagnosis/risks/treatment options with the patient and family  and believe the pt to be eligible for discharge home to follow-up with PCP. We also discussed returning to the ED immediately if new or worsening sx occur. We discussed the sx which are most concerning (e.g., sudden worsening pain, fever, inability to tolerate by mouth) that necessitate immediate return. Medications administered to the patient during their visit and any new prescriptions provided to the patient are listed below.  Medications given during this visit Medications - No data to display    The patient appears reasonably screen and/or stabilized for discharge and I doubt any other medical condition or other [facility] requiring further screening, evaluation, or treatment in the ED at this time prior to discharge.   Final Clinical Impressions(s) / ED Diagnoses   Final diagnoses:  Essential hypertension    ED Discharge Orders    None       Deno Etienne, DO 01/03/18 0222

## 2018-01-03 NOTE — ED Triage Notes (Signed)
Pt presents with complications of high BP that has been ongoing x 3 days; pt states she takes HCTZ and tricor; took her BP at home (182/91), still hypertensive on arrival to ER; pt denies CP, sob; pt reports earlier today she felt a headache with tingling/numbness down bilat arms but resolved now; pt adds that she may be constipated

## 2018-01-03 NOTE — ED Notes (Signed)
Discharge instructions discussed with Pt. Pt verbalized understanding. Pt stable and ambulatory.    

## 2018-01-03 NOTE — ED Notes (Signed)
ED Provider at bedside. 

## 2018-01-13 ENCOUNTER — Emergency Department (HOSPITAL_COMMUNITY)
Admission: EM | Admit: 2018-01-13 | Discharge: 2018-01-13 | Disposition: A | Discharge status: Home / Self Care | Payer: Medicaid Other | Attending: Emergency Medicine | Admitting: Emergency Medicine

## 2018-01-13 ENCOUNTER — Encounter (HOSPITAL_COMMUNITY): Payer: Self-pay

## 2018-01-13 DIAGNOSIS — Z79899 Other long term (current) drug therapy: Secondary | ICD-10-CM | POA: Diagnosis not present

## 2018-01-13 DIAGNOSIS — I10 Essential (primary) hypertension: Secondary | ICD-10-CM | POA: Diagnosis not present

## 2018-01-13 DIAGNOSIS — Z7984 Long term (current) use of oral hypoglycemic drugs: Secondary | ICD-10-CM | POA: Insufficient documentation

## 2018-01-13 DIAGNOSIS — E119 Type 2 diabetes mellitus without complications: Secondary | ICD-10-CM | POA: Insufficient documentation

## 2018-01-13 DIAGNOSIS — M542 Cervicalgia: Secondary | ICD-10-CM | POA: Insufficient documentation

## 2018-01-13 DIAGNOSIS — Z87891 Personal history of nicotine dependence: Secondary | ICD-10-CM | POA: Diagnosis not present

## 2018-01-13 MED ORDER — METHOCARBAMOL 500 MG PO TABS: 500.0000 mg | ORAL_TABLET | Freq: Three times a day (TID) | ORAL | 0 refills | Status: DC | PRN

## 2018-01-13 MED ORDER — LIDOCAINE 5 % EX PTCH: 1.0000 | MEDICATED_PATCH | CUTANEOUS | 0 refills | Status: DC

## 2018-01-13 NOTE — ED Notes (Signed)
Pt verbalized understanding discharge instructions and denies any further needs or questions at this time. VS stable, ambulatory and steady gait.   

## 2018-01-13 NOTE — ED Triage Notes (Signed)
Patient complains of right sided neck pain , worse with any ROM, states she awoke with same yesterday

## 2018-01-13 NOTE — ED Provider Notes (Signed)
[facility] EMERGENCY DEPARTMENT Provider Note   CSN: [id number] Arrival date & time: 01/13/18  1058     History   Chief Complaint No chief complaint on file.   HPI Jessica Lawson is a 52 y.o. female with a hx of DM, hyperlipidemia, and HTN who presents to the ED with complaints of right-sided neck pain that started yesterday morning when she woke up.  Patient states she is having pain to the right side of her neck this radiates down her right upper extremity to just above the elbow.  She states is a tight/spasm type pain.  She rates it an 8 out of 10 in severity, this is worse with movement of the right upper extremity in the neck.  She tried Tylenol yesterday with some improvement.  Denies injury.  Denies change in activity.  Denies numbness, weakness, paresthesias, fever, or chills.  HPI  Past Medical History:  Diagnosis Date  . Back pain   . Diabetes mellitus without complication ([place])   . High blood cholesterol   . Hypertension   . Kidney stone   . Normal cardiac stress test 02/2016   low risk  . Obesity   . Pain of cheek    and right side neck  . Tobacco use   . UTI (lower urinary tract infection)   . Wrist pain    left wrist    Patient Active Problem List   Diagnosis Date Noted  . Essential hypertension 11/01/2017  . Obesity (BMI 30-39.9) 11/01/2017  . Herpes simplex type 1 infection 12/15/2016  . Chest pain 03/05/2016  . Acute bronchitis 03/05/2016  . Environmental allergies 10/26/2015  . Hypercholesterolemia with hypertriglyceridemia 09/20/2015  . Hyperlipidemia 09/05/2015  . Right shoulder pain 09/05/2015  . Mouth ulcers 09/05/2015    Past Surgical History:  Procedure Laterality Date  . CESAREAN SECTION     3 c-sections     OB History   None      Home Medications    Prior to Admission medications   Medication Sig Start Date End Date Taking? Authorizing Provider  acetaminophen (TYLENOL) 325 MG tablet Take 2 tablets (650 mg  total) by mouth every 6 (six) hours as needed. Patient not taking: Reported on 01/03/2018 06/23/16   Mesner, Corene Cornea, MD  albuterol (PROVENTIL HFA;VENTOLIN HFA) 108 (90 Base) MCG/ACT inhaler Inhale 2 puffs into the lungs every 4 (four) hours as needed for wheezing or shortness of breath. 12/15/16   Dorena Dew, FNP  busPIRone (BUSPAR) 5 MG tablet Take 1 tablet (5 mg total) by mouth 2 (two) times daily. Patient not taking: Reported on 01/03/2018 03/03/17   Dorena Dew, FNP  cetirizine (ZYRTEC) 10 MG tablet Take 1 tablet (10 mg total) by mouth daily. 12/07/17   Dorena Dew, FNP  docusate sodium (COLACE) 100 MG capsule Take 1 capsule (100 mg total) by mouth daily. Patient not taking: Reported on 01/03/2018 08/22/16   Dorena Dew, FNP  fenofibrate (TRICOR) 48 MG tablet Take 1 tablet (48 mg total) by mouth daily. 11/01/17 11/01/18  Dorena Dew, FNP  gabapentin (NEURONTIN) 100 MG capsule Take 1 capsule (100 mg total) by mouth 2 (two) times daily. Patient not taking: Reported on 01/03/2018 08/22/16   Dorena Dew, FNP  hydrochlorothiazide (HYDRODIURIL) 12.5 MG tablet Take 1 tablet (12.5 mg total) by mouth daily. 10/27/17   Dorena Dew, FNP  hydrOXYzine (ATARAX/VISTARIL) 25 MG tablet Take 1 tablet (25 mg total) by mouth every 6 (  six) hours. Patient not taking: Reported on 01/03/2018 05/07/17   McDonald, Maree Erie A, PA-C  metFORMIN (GLUCOPHAGE) 500 MG tablet Take 1 tablet (500 mg total) by mouth 2 (two) times daily with a meal. Reported on 09/03/2015 Patient not taking: Reported on 01/03/2018 04/20/16   Micheline Chapman, NP  Multiple Vitamins-Minerals (MULTIVITAMIN WITH MINERALS) tablet Take 1 tablet by mouth daily.    [provider]  omega-3 acid ethyl esters (LOVAZA) 1 g capsule Take 1 capsule (1 g total) by mouth daily. 12/07/17   Dorena Dew, FNP  valACYclovir (VALTREX) 1000 MG tablet Take 1 tablet (1,000 mg total) by mouth 2 (two) times daily. Patient not taking: Reported  on 01/03/2018 12/15/16   Dorena Dew, FNP    Family History Family History  Problem Relation Age of Onset  . Hyperlipidemia Other   . Hypertension Other   . Diabetes Other   . Diabetes Mother   . Lung disease Father   . Diabetes Sister   . Diabetes Sister   . Hypertension Sister     Social History Social History   Tobacco Use  . Smoking status: Former Smoker    Packs/day: 1.00    Types: Cigarettes    Last attempt to quit: 07/26/2016    Years since quitting: 1.4  . Smokeless tobacco: Never Used  Substance Use Topics  . Alcohol use: No  . Drug use: No     Allergies   Asa [aspirin]; Iohexol; Penicillins; and Sulfa antibiotics   Review of Systems Review of Systems  Constitutional: Negative for chills and fever.  Eyes: Negative for visual disturbance.  Respiratory: Negative for shortness of breath.   Cardiovascular: Negative for chest pain.  Musculoskeletal: Positive for neck pain.  Neurological: Negative for weakness, numbness and headaches.       Negative for paresthesias     Physical Exam Updated Vital Signs BP (!) 165/75 (BP Location: Right Arm)   Pulse 77   Temp 98.1 F (36.7 C) (Oral)   Resp 20   LMP 08/22/2011 Comment: patient states LMP 20 years ago  SpO2 100%   Physical Exam  Constitutional: She appears well-developed and well-nourished. No distress.  HENT:  Head: Normocephalic and atraumatic.  Eyes: Conjunctivae are normal. Right eye exhibits no discharge. Left eye exhibits no discharge.  Neck: Normal range of motion. Neck supple. Muscular tenderness (Right paraspinal muscles are tender to palpation, specifically the trapezius, palpable muscle spasm.) present. No spinous process tenderness present. No neck rigidity. Normal range of motion present.  Cardiovascular: Normal rate and regular rhythm.  2+ symmetric radial pulses.  Musculoskeletal:  No obvious deformities, appreciable swelling, erythema, ecchymosis, or open wounds.  No overlying  warmth. Back: No midline tenderness. Upper extremities: Patient has normal range of motion to bilateral shoulders and elbows.  Her upper extremities are nontender to palpation.   Neurological: She is alert.  Clear speech.  Patient has 5 out of 5 symmetric grip strength.  Sensation grossly intact bilateral upper extremities. Gait intact.   Skin: Skin is warm and dry.  Psychiatric: She has a normal mood and affect. Her behavior is normal. Thought content normal.  Nursing note and vitals reviewed.   ED Treatments / Results  Labs (all labs ordered are listed, but only abnormal results are displayed) Labs Reviewed - No data to display  EKG None  Radiology No results found.  Procedures Procedures (including critical care time)  Medications Ordered in ED Medications - No data to display  Initial Impression / Assessment and Plan / ED Course  I have reviewed the triage vital signs and the nursing notes.  Pertinent labs & imaging results that were available during my care of the patient were reviewed by me and considered in my medical decision making (see chart for details).   Patient presents with right-sided neck pain.  Patient nontoxic-appearing, in no apparent distress, vitals WNL with the exception of elevated blood pressure, do not suspect HTN emergency, patient aware of need for recheck.  Patient with right paraspinal muscle tenderness to palpation with palpable muscle spasm especially in the trapezius region.  She is neurovascularly intact distally.  No evidence of cervical nerve root compression.  Midline tenderness.  No history of injury to raise concern for fracture or dislocation.  Suspect radiculopathy related to muscle tightness.  Will avoid NSAIDs given patient's allergy.  Will give prescription for Robaxin and Lidoderm patches.  Instructed patient not to drive or operate heavy machinery when taking Robaxin. I discussed  treatment plan, need for PCP follow-up, and return  precautions with the patient. Provided opportunity for questions, patient confirmed understanding and is in agreement with plan.   Vitals:   01/13/18 1145 01/13/18 1345  BP: (!) 165/75 (!) 142/84  Pulse: 77 74  Resp: 20 18  Temp: 98.1 F (36.7 C)   SpO2: 100% 100%    Final Clinical Impressions(s) / ED Diagnoses   Final diagnoses:  Neck pain    ED Discharge Orders        Ordered    methocarbamol (ROBAXIN) 500 MG tablet  Every 8 hours PRN     01/13/18 1343    lidocaine (LIDODERM) 5 %  Every 24 hours     01/13/18 1343       Baeleigh Devincent, Glynda Jaeger, PA-C 01/13/18 1600    Isla Pence, MD 01/14/18 [phone]

## 2018-01-13 NOTE — Discharge Instructions (Addendum)
You were seen in the emergency department for pain to your neck.  We suspect this to be related to a muscle spasm.  We are treating this with Robaxin and Lidoderm patches.  - Robaxin is the muscle relaxer I have prescribed, this is meant to help with muscle tightness. Be aware that this medication may make you drowsy therefore the first time you take this it should be at a time you are in an environment where you can rest. Do not drive or operate heavy machinery when taking this medication.  -Lidoderm patches: These are topical patches you may place directly over your area of discomfort daily.  If these are too expensive please asked the pharmacist about the over-the-counter version at these are similar.  You may take Tylenol per over-the-counter dosing with these medications safely.  Additionally we recommend that you apply heat to this area.  Follow-up with your primary care provider in 3 days for reevaluation.  Return to the ER for new or worsening symptoms or any other concerns.  Additionally have your blood pressure rechecked at her primary care appointment as it was elevated in the emergency department today. Vitals:   01/13/18 1145  BP: (!) 165/75  Pulse: 77  Resp: 20  Temp: 98.1 F (36.7 C)  SpO2: 100%

## 2018-01-18 ENCOUNTER — Telehealth: Payer: Self-pay

## 2018-01-19 ENCOUNTER — Other Ambulatory Visit: Payer: Self-pay | Admitting: Family Medicine

## 2018-01-19 DIAGNOSIS — E119 Type 2 diabetes mellitus without complications: Secondary | ICD-10-CM

## 2018-01-19 MED ORDER — METFORMIN HCL 500 MG PO TABS: 500.0000 mg | ORAL_TABLET | Freq: Two times a day (BID) | ORAL | 1 refills | Status: DC

## 2018-01-19 NOTE — Progress Notes (Signed)
Meds ordered this encounter  Medications  . metFORMIN (GLUCOPHAGE) 500 MG tablet    Sig: Take 1 tablet (500 mg total) by mouth 2 (two) times daily with a meal.    Dispense:  180 tablet    Refill:  [place]  MSN, FNP-C [facility] [address] [place], [place] [address] [phone]

## 2018-01-19 NOTE — Telephone Encounter (Signed)
[place], Patient's metformin is not on current list. Can you please advise if she still needs to be taking this and refill?

## 2018-01-29 ENCOUNTER — Encounter: Payer: Self-pay | Admitting: Family Medicine

## 2018-01-29 ENCOUNTER — Ambulatory Visit (INDEPENDENT_AMBULATORY_CARE_PROVIDER_SITE_OTHER): Payer: 59 | Admitting: Family Medicine

## 2018-01-29 VITALS — BP 142/82 | HR 81 | Temp 98.3°F | Resp 16 | Ht 59.0 in | Wt 180.0 lb

## 2018-01-29 DIAGNOSIS — I10 Essential (primary) hypertension: Secondary | ICD-10-CM

## 2018-01-29 DIAGNOSIS — R42 Dizziness and giddiness: Secondary | ICD-10-CM | POA: Diagnosis not present

## 2018-01-29 DIAGNOSIS — E119 Type 2 diabetes mellitus without complications: Secondary | ICD-10-CM | POA: Diagnosis not present

## 2018-01-29 DIAGNOSIS — E785 Hyperlipidemia, unspecified: Secondary | ICD-10-CM | POA: Diagnosis not present

## 2018-01-29 DIAGNOSIS — J301 Allergic rhinitis due to pollen: Secondary | ICD-10-CM | POA: Diagnosis not present

## 2018-01-29 DIAGNOSIS — N898 Other specified noninflammatory disorders of vagina: Secondary | ICD-10-CM | POA: Diagnosis not present

## 2018-01-29 LAB — POCT URINALYSIS DIPSTICK
Bilirubin, UA: NEGATIVE
Glucose, UA: NEGATIVE
Ketones, UA: NEGATIVE
LEUKOCYTES UA: NEGATIVE
NITRITE UA: NEGATIVE
PH UA: 5.5 (ref 5.0–8.0)
PROTEIN UA: POSITIVE — AB
UROBILINOGEN UA: 0.2 U/dL

## 2018-01-29 LAB — POCT GLYCOSYLATED HEMOGLOBIN (HGB A1C): Hemoglobin A1C: 6.8 % — AB (ref 4.0–5.6)

## 2018-01-29 MED ORDER — METFORMIN HCL 500 MG PO TABS: 500.0000 mg | ORAL_TABLET | Freq: Two times a day (BID) | ORAL | 1 refills | Status: DC

## 2018-01-29 MED ORDER — FENOFIBRATE 48 MG PO TABS: 48.0000 mg | ORAL_TABLET | Freq: Every day | ORAL | 5 refills | Status: DC

## 2018-01-29 MED ORDER — FLUTICASONE PROPIONATE 50 MCG/ACT NA SUSP: 2.0000 | Freq: Every day | NASAL | 6 refills | Status: DC

## 2018-01-29 MED ORDER — HYDROCHLOROTHIAZIDE 12.5 MG PO TABS: 12.5000 mg | ORAL_TABLET | Freq: Every day | ORAL | 1 refills | Status: DC

## 2018-01-29 MED ORDER — LORATADINE 10 MG PO TABS: 10.0000 mg | ORAL_TABLET | Freq: Every day | ORAL | 11 refills | Status: DC

## 2018-01-29 NOTE — Patient Instructions (Signed)
Your hemoglobin A1c is 6.8, which is at goal.  We will continue metformin 500 mg twice daily.  Also, discussed carbohydrate modify diet divided over small meals throughout the day at length.  We will follow-up by phone with any abnormal laboratory results   - Continue medication, monitor blood pressure at home. Continue DASH diet. Reminder to go to the ER if any CP, SOB, nausea, dizziness, severe HA, changes vision/speech, left arm numbness and tingling and jaw pain.      We will start loratadine 10 mg at bedtime and Flonase 1 spray to each nare twice daily as discussed.

## 2018-01-29 NOTE — Progress Notes (Signed)
Subjective:    Patient ID: Jessica Lawson, female    DOB: [birth year], 52 y.o.   MRN: [id number]  Jessica Lawson, a 52 year old female with a history of hypertension, hyperlipidemia, type 2 diabetes mellitus presents for follow-up of chronic conditions.  Patient is also complaining of periodic dizziness upon standing and with coughing/sneezing.  Hypertension  Pertinent negatives include no chest pain, headaches, neck pain or palpitations. Risk factors for coronary artery disease include diabetes mellitus, obesity and sedentary lifestyle. There is no history of kidney disease, CAD/MI, heart failure, PVD or retinopathy. There is no history of chronic renal disease, sleep apnea or a thyroid problem.  Hyperlipidemia  The problem is uncontrolled. Recent lipid tests were reviewed and are high. Exacerbating diseases include obesity. She has no history of chronic renal disease, diabetes, hypothyroidism or liver disease. Factors aggravating her hyperlipidemia include fatty foods and smoking. Pertinent negatives include no chest pain. Current antihyperlipidemic treatment includes diet change.  Diabetes  Her disease course has been stable. Hypoglycemia symptoms include dizziness. Pertinent negatives for hypoglycemia include no headaches, mood changes or nervousness/anxiousness. Pertinent negatives for diabetes include no chest pain, no fatigue, no foot paresthesias, no polydipsia, no polyphagia, no polyuria, no visual change and no weakness. Pertinent negatives for diabetic complications include no PVD or retinopathy. Current diabetic treatment includes oral agent (monotherapy). She is compliant with treatment most of the time. She rarely participates in exercise. An ACE inhibitor/angiotensin II receptor blocker is not being taken. She does not see a podiatrist.Eye exam is not current.  Dizziness  Episode onset: Several days ago when transitioning from sitting to standing and with coughing.  Typically lasts a few  seconds. The problem occurs intermittently. Associated symptoms include congestion and coughing. Pertinent negatives include no chest pain, fatigue, fever, headaches, joint swelling, nausea, neck pain, swollen glands, urinary symptoms, vertigo, visual change or weakness. The symptoms are aggravated by exertion and coughing.    Past Medical History:  Diagnosis Date  . Back pain   . Diabetes mellitus without complication ([place])   . High blood cholesterol   . Hypertension   . Kidney stone   . Normal cardiac stress test 02/2016   low risk  . Obesity   . Pain of cheek    and right side neck  . Tobacco use   . UTI (lower urinary tract infection)   . Wrist pain    left wrist   Social History   Socioeconomic History  . Marital status: Single    Spouse name: Not on file  . Number of children: Not on file  . Years of education: Not on file  . Highest education level: Not on file  Occupational History  . Not on file  Social Needs  . Financial resource strain: Not on file  . Food insecurity:    Worry: Not on file    Inability: Not on file  . Transportation needs:    Medical: Not on file    Non-medical: Not on file  Tobacco Use  . Smoking status: Former Smoker    Packs/day: 1.00    Types: Cigarettes    Last attempt to quit: 07/26/2016    Years since quitting: 1.5  . Smokeless tobacco: Never Used  Substance and Sexual Activity  . Alcohol use: No  . Drug use: No  . Sexual activity: Not on file  Lifestyle  . Physical activity:    Days per week: Not on file    Minutes per session: Not on file  .  Stress: Not on file  Relationships  . Social connections:    Talks on phone: Not on file    Gets together: Not on file    Attends religious service: Not on file    Active member of club or organization: Not on file    Attends meetings of clubs or organizations: Not on file    Relationship status: Not on file  . Intimate partner violence:    Fear of current or ex partner: Not on  file    Emotionally abused: Not on file    Physically abused: Not on file    Forced sexual activity: Not on file  Other Topics Concern  . Not on file  Social History Narrative  . Not on file   Immunization History  Administered Date(s) Administered  . Pneumococcal Polysaccharide-23 09/03/2015     Review of Systems  Constitutional: Positive for unexpected weight change (weight gain). Negative for fatigue and fever.  HENT: Positive for congestion.   Eyes: Negative.   Respiratory: Positive for cough.   Cardiovascular: Negative.  Negative for chest pain, palpitations and leg swelling.  Gastrointestinal: Negative.  Negative for nausea.  Endocrine: Negative.  Negative for polydipsia, polyphagia and polyuria.  Genitourinary: Negative.   Musculoskeletal: Negative.  Negative for joint swelling and neck pain.  Skin: Negative.   Allergic/Immunologic: Negative.   Neurological: Positive for dizziness. Negative for vertigo, weakness and headaches.  Hematological: Negative.   Psychiatric/Behavioral: The patient is not nervous/anxious.        Objective:   Physical Exam  Constitutional: She is oriented to person, place, and time.  HENT:  Head: Normocephalic and atraumatic.  Right Ear: External ear normal.  Left Ear: External ear normal.  Nose: Nose normal.  Mouth/Throat: Oropharynx is clear and moist.  Eyes: Pupils are equal, round, and reactive to light.  Neck: Normal range of motion. Neck supple.  Cardiovascular: Normal rate, regular rhythm, normal heart sounds and intact distal pulses.  Pulmonary/Chest: Effort normal and breath sounds normal.  Abdominal: Soft. Bowel sounds are normal.  Musculoskeletal: Normal range of motion.  Neurological: She is alert and oriented to person, place, and time. She has normal reflexes.  Skin: Skin is warm and dry.  Psychiatric: She has a normal mood and affect. Her behavior is normal. Judgment and thought content normal.      BP (!) 142/82 (BP  Location: Left Arm, Patient Position: Sitting, Cuff Size: Large) Comment: manually  Pulse 81   Temp 98.3 F (36.8 C) (Oral)   Resp 16   Ht 4\' 11"  (1.499 m)   Wt 180 lb (81.6 kg)   LMP 08/22/2011 Comment: patient states LMP 20 years ago  SpO2 99%   BMI 36.36 kg/m  Assessment & Plan:  1. Essential hypertension Reviewed orthostatic vitals, no abnormalities noted.  Blood pressure is at goal on current medication regimen, no changes warranted on today.- Continue medication, monitor blood pressure at home. Continue DASH diet. Reminder to go to the ER if any CP, SOB, nausea, dizziness, severe HA, changes vision/speech, left arm numbness and tingling and jaw pain.    - Urinalysis Dipstick - Lipid Panel - Comprehensive metabolic panel - hydrochlorothiazide (HYDRODIURIL) 12.5 MG tablet; Take 1 tablet (12.5 mg total) by mouth daily.  Dispense: 90 tablet; Refill: 1  2. Type 2 diabetes mellitus without complication, without long-term current use of insulin (HCC) Hemoglobin A1c is 6.8, which is at goal.  We will continue metformin 500 mg twice daily.  Patient reminded to  take second dose, she says she often misses dose.  Discussed program and phone as a reminder.  Also recommend carbohydrate modify diet divided over small meals throughout the day - HgB A1c - Lipid Panel - Comprehensive metabolic panel - metFORMIN (GLUCOPHAGE) 500 MG tablet; Take 1 tablet (500 mg total) by mouth 2 (two) times daily with a meal.  Dispense: 180 tablet; Refill: 1  3. Allergic rhinitis due to pollen, unspecified seasonality Will discontinue cetirizine and start Claritin 10 mg at bedtime.  Also fluticasone 2 sprays in each nare daily as needed. - loratadine (CLARITIN) 10 MG tablet; Take 1 tablet (10 mg total) by mouth daily.  Dispense: 30 tablet; Refill: 11 - fluticasone (FLONASE) 50 MCG/ACT nasal spray; Place 2 sprays into both nostrils daily.  Dispense: 16 g; Refill: 6  4. Hyperlipidemia, unspecified hyperlipidemia  type The 10-year ASCVD risk score [name], et al., 2013) is: 26.5%   Values used to calculate the score:     Age: 52 years     Sex: Female     Is Non-Hispanic African American: Yes     Diabetic: Yes     Tobacco smoker: No     Systolic Blood Pressure: 323 mmHg     Is BP treated: Yes     HDL Cholesterol: 37 mg/dL     Total Cholesterol: 270 mg/dL  - fenofibrate (TRICOR) 48 MG tablet; Take 1 tablet (48 mg total) by mouth daily.  Dispense: 30 tablet; Refill: 5  5. Dizziness Vitals, no acute abnormalities noted. - Orthostatic vital signs  6. Vaginal itching Patient complains of periodic vaginal itching, we will follow-up by phone after reviewing results. - Vaginitis/Vaginosis, DNA Probe   RTC: 3 months for chronic conditions   [name]  MSN, FNP-C [facility] [address] [place], [place] [address] [phone]

## 2018-01-30 LAB — LIPID PANEL
CHOL/HDL RATIO: 8.8 ratio — AB (ref 0.0–4.4)
Cholesterol, Total: 335 mg/dL — ABNORMAL HIGH (ref 100–199)
HDL: 38 mg/dL — AB (ref >39)
LDL Calculated: 245 mg/dL — ABNORMAL HIGH (ref 0–99)
Triglycerides: 258 mg/dL — ABNORMAL HIGH (ref 0–149)
VLDL Cholesterol Cal: 52 mg/dL — ABNORMAL HIGH (ref 5–40)

## 2018-01-30 LAB — COMPREHENSIVE METABOLIC PANEL
ALT: 33 IU/L — AB (ref 0–32)
AST: 18 IU/L (ref 0–40)
Albumin/Globulin Ratio: 1.7 (ref 1.2–2.2)
Albumin: 4.8 g/dL (ref 3.5–5.5)
Alkaline Phosphatase: 58 IU/L (ref 39–117)
BUN/Creatinine Ratio: 19 (ref 9–23)
BUN: 17 mg/dL (ref 6–24)
Bilirubin Total: 0.3 mg/dL (ref 0.0–1.2)
CALCIUM: 9.9 mg/dL (ref 8.7–10.2)
CO2: 23 mmol/L (ref 20–29)
CREATININE: 0.9 mg/dL (ref 0.57–1.00)
Chloride: 102 mmol/L (ref 96–106)
GFR, EST AFRICAN AMERICAN: 86 mL/min/{1.73_m2} (ref >59)
GFR, EST NON AFRICAN AMERICAN: 74 mL/min/{1.73_m2} (ref >59)
GLUCOSE: 122 mg/dL — AB (ref 65–99)
Globulin, Total: 2.9 g/dL (ref 1.5–4.5)
Potassium: 3.9 mmol/L (ref 3.5–5.2)
Sodium: 141 mmol/L (ref 134–144)
TOTAL PROTEIN: 7.7 g/dL (ref 6.0–8.5)

## 2018-01-31 LAB — VAGINITIS/VAGINOSIS, DNA PROBE
Candida Species: NEGATIVE
Gardnerella vaginalis: NEGATIVE
Trichomonas vaginosis: NEGATIVE

## 2018-04-28 ENCOUNTER — Other Ambulatory Visit: Payer: Self-pay | Admitting: Family Medicine

## 2018-04-28 DIAGNOSIS — E785 Hyperlipidemia, unspecified: Secondary | ICD-10-CM

## 2018-05-02 ENCOUNTER — Encounter: Payer: Self-pay | Admitting: Family Medicine

## 2018-05-02 ENCOUNTER — Ambulatory Visit (INDEPENDENT_AMBULATORY_CARE_PROVIDER_SITE_OTHER): Payer: Medicaid Other | Admitting: Family Medicine

## 2018-05-02 VITALS — BP 148/86 | HR 70 | Temp 97.5°F | Resp 16 | Ht 59.0 in | Wt 180.0 lb

## 2018-05-02 DIAGNOSIS — I10 Essential (primary) hypertension: Secondary | ICD-10-CM | POA: Diagnosis not present

## 2018-05-02 DIAGNOSIS — E785 Hyperlipidemia, unspecified: Secondary | ICD-10-CM

## 2018-05-02 DIAGNOSIS — Z114 Encounter for screening for human immunodeficiency virus [HIV]: Secondary | ICD-10-CM | POA: Diagnosis not present

## 2018-05-02 DIAGNOSIS — N898 Other specified noninflammatory disorders of vagina: Secondary | ICD-10-CM | POA: Diagnosis not present

## 2018-05-02 DIAGNOSIS — E119 Type 2 diabetes mellitus without complications: Secondary | ICD-10-CM

## 2018-05-02 DIAGNOSIS — E782 Mixed hyperlipidemia: Secondary | ICD-10-CM | POA: Diagnosis not present

## 2018-05-02 LAB — POCT GLYCOSYLATED HEMOGLOBIN (HGB A1C): Hemoglobin A1C: 6 % — AB (ref 4.0–5.6)

## 2018-05-02 LAB — POCT URINALYSIS DIPSTICK
Bilirubin, UA: NEGATIVE
Glucose, UA: NEGATIVE
Ketones, UA: NEGATIVE
Leukocytes, UA: NEGATIVE
Nitrite, UA: NEGATIVE
Protein, UA: NEGATIVE
Spec Grav, UA: 1.025 (ref 1.010–1.025)
Urobilinogen, UA: 0.2 E.U./dL
pH, UA: 5.5 (ref 5.0–8.0)

## 2018-05-02 MED ORDER — OMEGA-3-ACID ETHYL ESTERS 1 G PO CAPS: 1.0000 | ORAL_CAPSULE | Freq: Every day | ORAL | 1 refills | Status: DC

## 2018-05-02 MED ORDER — HYDROCHLOROTHIAZIDE 12.5 MG PO TABS: 12.5000 mg | ORAL_TABLET | Freq: Every day | ORAL | 1 refills | Status: DC

## 2018-05-02 MED ORDER — BLOOD GLUCOSE MONITOR KIT: PACK | 0 refills | Status: DC

## 2018-05-02 MED ORDER — FENOFIBRATE 48 MG PO TABS: 48.0000 mg | ORAL_TABLET | Freq: Every day | ORAL | 5 refills | Status: DC

## 2018-05-02 NOTE — Progress Notes (Signed)
Patient [facility]   Progress Note: General Provider: Lanae Boast, FNP  SUBJECTIVE:   Jessica Lawson is a 52 y.o. female who  has a past medical history of Back pain, Diabetes mellitus without complication ([place]), High blood cholesterol, Hypertension, Kidney stone, Normal cardiac stress test (02/2016), Obesity, Pain of cheek, Tobacco use, UTI (lower urinary tract infection), and Wrist pain.. Patient presents today for Hypertension and Follow-up (pre diabetes )  Patient states that she is trying to lose weight. Walking every day for 30 minutes per day.  She states that she feels that she is getting a yeast infection due to the metformin. Patient states that she is taking metformin once a day. Does not have supplies to check blood glucose  Not following a carb modified diet.  Review of Systems  Constitutional: Negative.   HENT: Negative.   Eyes: Negative.   Respiratory: Negative.   Cardiovascular: Negative.   Gastrointestinal: Negative.   Genitourinary: Negative.        Vaginal irritation  Musculoskeletal: Negative.   Skin: Negative.   Neurological: Negative.   Psychiatric/Behavioral: Negative.      OBJECTIVE: BP (!) 148/86 (BP Location: Left Arm, Patient Position: Sitting, Cuff Size: Normal)   Pulse 70   Temp (!) 97.5 F (36.4 C) (Oral)   Resp 16   Ht 4\' 11"  (1.499 m)   Wt 180 lb (81.6 kg)   LMP 08/22/2011 Comment: patient states LMP 20 years ago  SpO2 99%   BMI 36.36 kg/m   Physical Exam  Constitutional: She is oriented to person, place, and time. She appears well-developed and well-nourished. No distress.  HENT:  Head: Normocephalic and atraumatic.  Eyes: Pupils are equal, round, and reactive to light. Conjunctivae and EOM are normal.  Neck: Normal range of motion.  Cardiovascular: Normal rate, regular rhythm, normal heart sounds and intact distal pulses.  Pulmonary/Chest: Effort normal and breath sounds normal. No  respiratory distress.  Abdominal: Soft. Bowel sounds are normal. She exhibits no distension.  Genitourinary:  Genitourinary Comments: Pt declined exam. Self collected sample  Musculoskeletal: Normal range of motion.  Neurological: She is alert and oriented to person, place, and time.  Skin: Skin is warm and dry.  Psychiatric: She has a normal mood and affect. Her behavior is normal. Thought content normal.  Nursing note and vitals reviewed.   ASSESSMENT/PLAN:  1. Essential hypertension - Urinalysis Dipstick - hydrochlorothiazide (HYDRODIURIL) 12.5 MG tablet; Take 1 tablet (12.5 mg total) by mouth daily.  Dispense: 90 tablet; Refill: 1  2. Type 2 diabetes mellitus without complication, without long-term current use of insulin (HCC)  - HgB A1c - Comprehensive metabolic panel - Lipid Panel - Microalbumin, urine - Ambulatory referral to Ophthalmology  3. Screening for HIV (human immunodeficiency virus) Health maintenance - HIV antibody (with reflex)  4. Mixed hyperlipidemia  - Comprehensive metabolic panel - Lipid Panel  5. Hyperlipidemia, unspecified hyperlipidemia type  - fenofibrate (TRICOR) 48 MG tablet; Take 1 tablet (48 mg total) by mouth daily.  Dispense: 30 tablet; Refill: 5 - omega-3 acid ethyl esters (LOVAZA) 1 g capsule; Take 1 capsule (1 g total) by mouth daily.  Dispense: 90 capsule; Refill: 1  6. Itching in the vaginal area  - Vaginitis/Vaginosis, DNA Probe        The patient was given clear instructions to go to ER or return to medical center if symptoms do not improve, worsen or new problems develop. The patient verbalized understanding and agreed  with plan of care.   [name], FNP-BC [facility] [address] [place], [place] [address] [phone]     This note has been created with Dragon speech recognition software and smart phrase technology. Any transcriptional errors are unintentional.

## 2018-05-02 NOTE — Patient Instructions (Signed)
Hypertension Hypertension is another name for high blood pressure. High blood pressure forces your heart to work harder to pump blood. This can cause problems over time. There are two numbers in a blood pressure reading. There is a top number (systolic) over a bottom number (diastolic). It is best to have a blood pressure below 120/80. Healthy choices can help lower your blood pressure. You may need medicine to help lower your blood pressure if:  Your blood pressure cannot be lowered with healthy choices.  Your blood pressure is higher than 130/80.  Follow these instructions at home: Eating and drinking  If directed, follow the DASH eating plan. This diet includes: ? Filling half of your plate at each meal with fruits and vegetables. ? Filling one quarter of your plate at each meal with whole grains. Whole grains include whole wheat pasta, brown rice, and whole grain bread. ? Eating or drinking low-fat dairy products, such as skim milk or low-fat yogurt. ? Filling one quarter of your plate at each meal with low-fat (lean) proteins. Low-fat proteins include fish, skinless chicken, eggs, beans, and tofu. ? Avoiding fatty meat, cured and processed meat, or chicken with skin. ? Avoiding premade or processed food.  Eat less than 1,500 mg of salt (sodium) a day.  Limit alcohol use to no more than 1 drink a day for nonpregnant women and 2 drinks a day for men. One drink equals 12 oz of beer, 5 oz of wine, or 1 oz of hard liquor. Lifestyle  Work with your doctor to stay at a healthy weight or to lose weight. Ask your doctor what the best weight is for you.  Get at least 30 minutes of exercise that causes your heart to beat faster (aerobic exercise) most days of the week. This may include walking, swimming, or biking.  Get at least 30 minutes of exercise that strengthens your muscles (resistance exercise) at least 3 days a week. This may include lifting weights or pilates.  Do not use any  products that contain nicotine or tobacco. This includes cigarettes and e-cigarettes. If you need help quitting, ask your doctor.  Check your blood pressure at home as told by your doctor.  Keep all follow-up visits as told by your doctor. This is important. Medicines  Take over-the-counter and prescription medicines only as told by your doctor. Follow directions carefully.  Do not skip doses of blood pressure medicine. The medicine does not work as well if you skip doses. Skipping doses also puts you at risk for problems.  Ask your doctor about side effects or reactions to medicines that you should watch for. Contact a doctor if:  You think you are having a reaction to the medicine you are taking.  You have headaches that keep coming back (recurring).  You feel dizzy.  You have swelling in your ankles.  You have trouble with your vision. Get help right away if:  You get a very bad headache.  You start to feel confused.  You feel weak or numb.  You feel faint.  You get very bad pain in your: ? Chest. ? Belly (abdomen).  You throw up (vomit) more than once.  You have trouble breathing. Summary  Hypertension is another name for high blood pressure.  Making healthy choices can help lower blood pressure. If your blood pressure cannot be controlled with healthy choices, you may need to take medicine. This information is not intended to replace advice given to you by your health care   provider. Make sure you discuss any questions you have with your health care provider. Document Released: 01/18/2008 Document Revised: 06/29/2016 Document Reviewed: 06/29/2016 Elsevier Interactive Patient Education  2018 [employer]  DASH Eating Plan DASH stands for "Dietary Approaches to Stop Hypertension." The DASH eating plan is a healthy eating plan that has been shown to reduce high blood pressure (hypertension). It may also reduce your risk for type 2 diabetes, heart disease, and  stroke. The DASH eating plan may also help with weight loss. What are tips for following this plan? General guidelines  Avoid eating more than 2,300 mg (milligrams) of salt (sodium) a day. If you have hypertension, you may need to reduce your sodium intake to 1,500 mg a day.  Limit alcohol intake to no more than 1 drink a day for nonpregnant women and 2 drinks a day for men. One drink equals 12 oz of beer, 5 oz of wine, or 1 oz of hard liquor.  Work with your health care provider to maintain a healthy body weight or to lose weight. Ask what an ideal weight is for you.  Get at least 30 minutes of exercise that causes your heart to beat faster (aerobic exercise) most days of the week. Activities may include walking, swimming, or biking.  Work with your health care provider or diet and nutrition specialist (dietitian) to adjust your eating plan to your individual calorie needs. Reading food labels  Check food labels for the amount of sodium per serving. Choose foods with less than 5 percent of the Daily Value of sodium. Generally, foods with less than 300 mg of sodium per serving fit into this eating plan.  To find whole grains, look for the word "whole" as the first word in the ingredient list. Shopping  Buy products labeled as "low-sodium" or "no salt added."  Buy fresh foods. Avoid canned foods and premade or frozen meals. Cooking  Avoid adding salt when cooking. Use salt-free seasonings or herbs instead of table salt or sea salt. Check with your health care provider or pharmacist before using salt substitutes.  Do not fry foods. Cook foods using healthy methods such as baking, boiling, grilling, and broiling instead.  Cook with heart-healthy oils, such as olive, canola, soybean, or sunflower oil. Meal planning   Eat a balanced diet that includes: ? 5 or more servings of fruits and vegetables each day. At each meal, try to fill half of your plate with fruits and vegetables. ? Up  to 6-8 servings of whole grains each day. ? Less than 6 oz of lean meat, poultry, or fish each day. A 3-oz serving of meat is about the same size as a deck of cards. One egg equals 1 oz. ? 2 servings of low-fat dairy each day. ? A serving of nuts, seeds, or beans 5 times each week. ? Heart-healthy fats. Healthy fats called Omega-3 fatty acids are found in foods such as flaxseeds and coldwater fish, like sardines, salmon, and mackerel.  Limit how much you eat of the following: ? Canned or prepackaged foods. ? Food that is high in trans fat, such as fried foods. ? Food that is high in saturated fat, such as fatty meat. ? Sweets, desserts, sugary drinks, and other foods with added sugar. ? Full-fat dairy products.  Do not salt foods before eating.  Try to eat at least 2 vegetarian meals each week.  Eat more home-cooked food and less restaurant, buffet, and fast food.  When eating at a restaurant,   ask that your food be prepared with less salt or no salt, if possible. What foods are recommended? The items listed may not be a complete list. Talk with your dietitian about what dietary choices are best for you. Grains Whole-grain or whole-wheat bread. Whole-grain or whole-wheat pasta. Brown rice. Oatmeal. Quinoa. Bulgur. Whole-grain and low-sodium cereals. Pita bread. Low-fat, low-sodium crackers. Whole-wheat flour tortillas. Vegetables Fresh or frozen vegetables (raw, steamed, roasted, or grilled). Low-sodium or reduced-sodium tomato and vegetable juice. Low-sodium or reduced-sodium tomato sauce and tomato paste. Low-sodium or reduced-sodium canned vegetables. Fruits All fresh, dried, or frozen fruit. Canned fruit in natural juice (without added sugar). Meat and other protein foods Skinless chicken or turkey. Ground chicken or turkey. Pork with fat trimmed off. Fish and seafood. Egg whites. Dried beans, peas, or lentils. Unsalted nuts, nut butters, and seeds. Unsalted canned beans. Lean cuts of  beef with fat trimmed off. Low-sodium, lean deli meat. Dairy Low-fat (1%) or fat-free (skim) milk. Fat-free, low-fat, or reduced-fat cheeses. Nonfat, low-sodium ricotta or cottage cheese. Low-fat or nonfat yogurt. Low-fat, low-sodium cheese. Fats and oils Soft margarine without trans fats. Vegetable oil. Low-fat, reduced-fat, or light mayonnaise and salad dressings (reduced-sodium). Canola, safflower, olive, soybean, and sunflower oils. Avocado. Seasoning and other foods Herbs. Spices. Seasoning mixes without salt. Unsalted popcorn and pretzels. Fat-free sweets. What foods are not recommended? The items listed may not be a complete list. Talk with your dietitian about what dietary choices are best for you. Grains Baked goods made with fat, such as croissants, muffins, or some breads. Dry pasta or rice meal packs. Vegetables Creamed or fried vegetables. Vegetables in a cheese sauce. Regular canned vegetables (not low-sodium or reduced-sodium). Regular canned tomato sauce and paste (not low-sodium or reduced-sodium). Regular tomato and vegetable juice (not low-sodium or reduced-sodium). Pickles. Olives. Fruits Canned fruit in a light or heavy syrup. Fried fruit. Fruit in cream or butter sauce. Meat and other protein foods Fatty cuts of meat. Ribs. Fried meat. Bacon. Sausage. Bologna and other processed lunch meats. Salami. Fatback. Hotdogs. Bratwurst. Salted nuts and seeds. Canned beans with added salt. Canned or smoked fish. Whole eggs or egg yolks. Chicken or turkey with skin. Dairy Whole or 2% milk, cream, and half-and-half. Whole or full-fat cream cheese. Whole-fat or sweetened yogurt. Full-fat cheese. Nondairy creamers. Whipped toppings. Processed cheese and cheese spreads. Fats and oils Butter. Stick margarine. Lard. Shortening. Ghee. Bacon fat. Tropical oils, such as coconut, palm kernel, or palm oil. Seasoning and other foods Salted popcorn and pretzels. Onion salt, garlic salt, seasoned  salt, table salt, and sea salt. Worcestershire sauce. Tartar sauce. Barbecue sauce. Teriyaki sauce. Soy sauce, including reduced-sodium. Steak sauce. Canned and packaged gravies. Fish sauce. Oyster sauce. Cocktail sauce. Horseradish that you find on the shelf. Ketchup. Mustard. Meat flavorings and tenderizers. Bouillon cubes. Hot sauce and Tabasco sauce. Premade or packaged marinades. Premade or packaged taco seasonings. Relishes. Regular salad dressings. Where to find more information:  National Heart, Lung, and Blood Institute: www.nhlbi.nih.gov  American Heart Association: www.heart.org Summary  The DASH eating plan is a healthy eating plan that has been shown to reduce high blood pressure (hypertension). It may also reduce your risk for type 2 diabetes, heart disease, and stroke.  With the DASH eating plan, you should limit salt (sodium) intake to 2,300 mg a day. If you have hypertension, you may need to reduce your sodium intake to 1,500 mg a day.  When on the DASH eating plan, aim to eat more fresh   more fresh fruits and vegetables, whole grains, lean proteins, low-fat dairy, and heart-healthy fats.  Work with your health care provider or diet and nutrition specialist (dietitian) to adjust your eating plan to your individual calorie needs. This information is not intended to replace advice given to you by your health care provider. Make sure you discuss any questions you have with your health care provider. Document Released: 07/21/2011 Document Revised: 07/25/2016 Document Reviewed: 07/25/2016 Elsevier Interactive Patient Education  [employer].     Why follow it? Research shows. . Those who follow the Mediterranean diet have a reduced risk of heart disease  . The diet is associated with a reduced incidence of Parkinson's and Alzheimer's diseases . People following the diet may have longer life expectancies and lower rates of chronic diseases  . The Dietary Guidelines for Americans  recommends the Mediterranean diet as an eating plan to promote health and prevent disease  What Is the Mediterranean Diet?  . Healthy eating plan based on typical foods and recipes of Mediterranean-style cooking . The diet is primarily a plant based diet; these foods should make up a majority of meals   Starches - Plant based foods should make up a majority of meals - They are an important sources of vitamins, minerals, energy, antioxidants, and fiber - Choose whole grains, foods high in fiber and minimally processed items  - Typical grain sources include wheat, oats, barley, corn, brown rice, bulgar, farro, millet, polenta, couscous  - Various types of beans include chickpeas, lentils, fava beans, black beans, white beans   Fruits  Veggies - Large quantities of antioxidant rich fruits & veggies; 6 or more servings  - Vegetables can be eaten raw or lightly drizzled with oil and cooked  - Vegetables common to the traditional Mediterranean Diet include: artichokes, arugula, beets, broccoli, brussel sprouts, cabbage, carrots, celery, collard greens, cucumbers, eggplant, kale, leeks, lemons, lettuce, mushrooms, okra, onions, peas, peppers, potatoes, pumpkin, radishes, rutabaga, shallots, spinach, sweet potatoes, turnips, zucchini - Fruits common to the Mediterranean Diet include: apples, apricots, avocados, cherries, clementines, dates, figs, grapefruits, grapes, melons, nectarines, oranges, peaches, pears, pomegranates, strawberries, tangerines  Fats - Replace butter and margarine with healthy oils, such as olive oil, canola oil, and tahini  - Limit nuts to no more than a handful a day  - Nuts include walnuts, almonds, pecans, pistachios, pine nuts  - Limit or avoid candied, honey roasted or heavily salted nuts - Olives are central to the [employer] - can be eaten whole or used in a variety of dishes   Meats Protein - Limiting red meat: no more than a few times a month - When eating red  meat: choose lean cuts and keep the portion to the size of deck of cards - Eggs: approx. 0 to 4 times a week  - Fish and lean poultry: at least 2 a week  - Healthy protein sources include, chicken, [place], lean beef, lamb - Increase intake of seafood such as tuna, salmon, trout, mackerel, shrimp, scallops - Avoid or limit high fat processed meats such as sausage and bacon  Dairy - Include moderate amounts of low fat dairy products  - Focus on healthy dairy such as fat free yogurt, skim milk, low or reduced fat cheese - Limit dairy products higher in fat such as whole or 2% milk, cheese, ice cream  Alcohol - Moderate amounts of red wine is ok  - No more than 5 oz daily for women (all ages) and  men older than age 60  - No more than 10 oz of wine daily for men younger than 64  Other - Limit sweets and other desserts  - Use herbs and spices instead of salt to flavor foods  - Herbs and spices common to the traditional Mediterranean Diet include: basil, bay leaves, chives, cloves, cumin, fennel, garlic, lavender, marjoram, mint, oregano, parsley, pepper, rosemary, sage, savory, sumac, tarragon, thyme   It's not just a diet, it's a lifestyle:  . The Mediterranean diet includes lifestyle factors typical of those in the region  . Foods, drinks and meals are best eaten with others and savored . Daily physical activity is important for overall good health . This could be strenuous exercise like running and aerobics . This could also be more leisurely activities such as walking, housework, yard-work, or taking the stairs . Moderation is the key; a balanced and healthy diet accommodates most foods and drinks . Consider portion sizes and frequency of consumption of certain foods   Meal Ideas & Options:  . Breakfast:  o Whole wheat toast or whole wheat English muffins with peanut butter & hard boiled egg o Steel cut oats topped with apples & cinnamon and skim milk  o Fresh fruit: banana, strawberries,  melon, berries, peaches  o Smoothies: strawberries, bananas, greek yogurt, peanut butter o Low fat greek yogurt with blueberries and granola  o Egg white omelet with spinach and mushrooms o Breakfast couscous: whole wheat couscous, apricots, skim milk, cranberries  . Sandwiches:  o Hummus and grilled vegetables (peppers, zucchini, squash) on whole wheat bread   o Grilled chicken on whole wheat pita with lettuce, tomatoes, cucumbers or tzatziki  o Tuna salad on whole wheat bread: tuna salad made with greek yogurt, olives, red peppers, capers, green onions o Garlic rosemary lamb pita: lamb sauted with garlic, rosemary, salt & pepper; add lettuce, cucumber, greek yogurt to pita - flavor with lemon juice and black pepper  . Seafood:  o Mediterranean grilled salmon, seasoned with garlic, basil, parsley, lemon juice and black pepper o Shrimp, lemon, and spinach whole-grain pasta salad made with low fat greek yogurt  o Seared scallops with lemon orzo  o Seared tuna steaks seasoned salt, pepper, coriander topped with tomato mixture of olives, tomatoes, olive oil, minced garlic, parsley, green onions and cappers  . Meats:  o Herbed greek chicken salad with kalamata olives, cucumber, feta  o Red bell peppers stuffed with spinach, bulgur, lean ground beef (or lentils) & topped with feta   o Kebabs: skewers of chicken, tomatoes, onions, zucchini, squash  o [place] burgers: made with red onions, mint, dill, lemon juice, feta cheese topped with roasted red peppers . Vegetarian o Cucumber salad: cucumbers, artichoke hearts, celery, red onion, feta cheese, tossed in olive oil & lemon juice  o Hummus and whole grain pita points with a greek salad (lettuce, tomato, feta, olives, cucumbers, red onion) o Lentil soup with celery, carrots made with vegetable broth, garlic, salt and pepper  o Tabouli salad: parsley, bulgur, mint, scallions, cucumbers, tomato, radishes, lemon juice, olive oil, salt and  pepper.

## 2018-05-03 LAB — LIPID PANEL
Chol/HDL Ratio: 8.7 ratio — ABNORMAL HIGH (ref 0.0–4.4)
Cholesterol, Total: 355 mg/dL — ABNORMAL HIGH (ref 100–199)
HDL: 41 mg/dL (ref >39)
LDL Calculated: 254 mg/dL — ABNORMAL HIGH (ref 0–99)
Triglycerides: 298 mg/dL — ABNORMAL HIGH (ref 0–149)
VLDL Cholesterol Cal: 60 mg/dL — ABNORMAL HIGH (ref 5–40)

## 2018-05-03 LAB — COMPREHENSIVE METABOLIC PANEL
ALT: 29 IU/L (ref 0–32)
AST: 19 IU/L (ref 0–40)
Albumin/Globulin Ratio: 1.7 (ref 1.2–2.2)
Albumin: 4.8 g/dL (ref 3.5–5.5)
Alkaline Phosphatase: 54 IU/L (ref 39–117)
BUN/Creatinine Ratio: 17 (ref 9–23)
BUN: 16 mg/dL (ref 6–24)
Bilirubin Total: 0.3 mg/dL (ref 0.0–1.2)
CO2: 23 mmol/L (ref 20–29)
Calcium: 9.9 mg/dL (ref 8.7–10.2)
Chloride: 102 mmol/L (ref 96–106)
Creatinine, Ser: 0.96 mg/dL (ref 0.57–1.00)
GFR calc Af Amer: 79 mL/min/{1.73_m2} (ref >59)
GFR calc non Af Amer: 68 mL/min/{1.73_m2} (ref >59)
Globulin, Total: 2.8 g/dL (ref 1.5–4.5)
Glucose: 91 mg/dL (ref 65–99)
Potassium: 4.1 mmol/L (ref 3.5–5.2)
Sodium: 143 mmol/L (ref 134–144)
Total Protein: 7.6 g/dL (ref 6.0–8.5)

## 2018-05-03 LAB — HIV ANTIBODY (ROUTINE TESTING W REFLEX): HIV Screen 4th Generation wRfx: NONREACTIVE

## 2018-05-03 LAB — MICROALBUMIN, URINE: Microalbumin, Urine: 16.3 ug/mL

## 2018-05-04 ENCOUNTER — Other Ambulatory Visit: Payer: Self-pay | Admitting: Family Medicine

## 2018-05-04 ENCOUNTER — Telehealth: Payer: Self-pay

## 2018-05-04 LAB — VAGINITIS/VAGINOSIS, DNA PROBE
Candida Species: POSITIVE — AB
Gardnerella vaginalis: NEGATIVE
Trichomonas vaginosis: NEGATIVE

## 2018-05-04 MED ORDER — FLUCONAZOLE 150 MG PO TABS: 150.0000 mg | ORAL_TABLET | Freq: Once | ORAL | 0 refills | Status: AC

## 2018-05-04 NOTE — Telephone Encounter (Signed)
-----   Message from Lanae Boast, [place] sent at 05/04/2018  8:17 AM EDT ----- Patient positive for vaginal yeast infection. Diflucan sent to the pharmacy on file.

## 2018-05-04 NOTE — Progress Notes (Signed)
Diflucan sent

## 2018-05-04 NOTE — Telephone Encounter (Signed)
Called and spoke with patient, advised that she was positive for yeast infection and rx for this has been sent into pharmacy. Advised to take as directed. Thanks!

## 2018-05-18 ENCOUNTER — Emergency Department (HOSPITAL_COMMUNITY): Payer: Medicaid Other

## 2018-05-18 ENCOUNTER — Encounter (HOSPITAL_COMMUNITY): Payer: Self-pay

## 2018-05-18 ENCOUNTER — Emergency Department (HOSPITAL_COMMUNITY)
Admission: EM | Admit: 2018-05-18 | Discharge: 2018-05-18 | Disposition: A | Discharge status: Home / Self Care | Payer: Medicaid Other | Attending: Emergency Medicine | Admitting: Emergency Medicine

## 2018-05-18 DIAGNOSIS — Z743 Need for continuous supervision: Secondary | ICD-10-CM | POA: Diagnosis not present

## 2018-05-18 DIAGNOSIS — R0789 Other chest pain: Secondary | ICD-10-CM | POA: Diagnosis not present

## 2018-05-18 DIAGNOSIS — I10 Essential (primary) hypertension: Secondary | ICD-10-CM | POA: Diagnosis not present

## 2018-05-18 DIAGNOSIS — Z79899 Other long term (current) drug therapy: Secondary | ICD-10-CM | POA: Diagnosis not present

## 2018-05-18 DIAGNOSIS — R079 Chest pain, unspecified: Secondary | ICD-10-CM | POA: Diagnosis not present

## 2018-05-18 DIAGNOSIS — R000 Tachycardia, unspecified: Secondary | ICD-10-CM | POA: Diagnosis not present

## 2018-05-18 DIAGNOSIS — E119 Type 2 diabetes mellitus without complications: Secondary | ICD-10-CM | POA: Insufficient documentation

## 2018-05-18 DIAGNOSIS — Z87891 Personal history of nicotine dependence: Secondary | ICD-10-CM | POA: Insufficient documentation

## 2018-05-18 DIAGNOSIS — R0602 Shortness of breath: Secondary | ICD-10-CM | POA: Diagnosis not present

## 2018-05-18 DIAGNOSIS — Z7984 Long term (current) use of oral hypoglycemic drugs: Secondary | ICD-10-CM | POA: Diagnosis not present

## 2018-05-18 LAB — BASIC METABOLIC PANEL
Anion gap: 11 (ref 5–15)
BUN: 14 mg/dL (ref 6–20)
CHLORIDE: 104 mmol/L (ref 98–111)
CO2: 24 mmol/L (ref 22–32)
CREATININE: 0.89 mg/dL (ref 0.44–1.00)
Calcium: 9.4 mg/dL (ref 8.9–10.3)
GFR calc Af Amer: >60 mL/min (ref >60)
GFR calc non Af Amer: >60 mL/min (ref >60)
Glucose, Bld: 141 mg/dL — ABNORMAL HIGH (ref 70–99)
Potassium: 3.2 mmol/L — ABNORMAL LOW (ref 3.5–5.1)
SODIUM: 139 mmol/L (ref 135–145)

## 2018-05-18 LAB — CBC
HCT: 42.5 % (ref 36.0–46.0)
Hemoglobin: 14.3 g/dL (ref 12.0–15.0)
MCH: 28.1 pg (ref 26.0–34.0)
MCHC: 33.6 g/dL (ref 30.0–36.0)
MCV: 83.7 fL (ref 78.0–100.0)
PLATELETS: 356 10*3/uL (ref 150–400)
RBC: 5.08 MIL/uL (ref 3.87–5.11)
RDW: 13.2 % (ref 11.5–15.5)
WBC: 6.5 10*3/uL (ref 4.0–10.5)

## 2018-05-18 LAB — I-STAT TROPONIN, ED
Troponin i, poc: 0 ng/mL (ref 0.00–0.08)
Troponin i, poc: 0 ng/mL (ref 0.00–0.08)

## 2018-05-18 LAB — I-STAT BETA HCG BLOOD, ED (MC, WL, AP ONLY)

## 2018-05-18 NOTE — ED Notes (Addendum)
Pt states "I feel like something is running up the back of my legs"; EDP notified

## 2018-05-18 NOTE — ED Provider Notes (Signed)
[facility] EMERGENCY DEPARTMENT Provider Note   CSN: [id number] Arrival date & time: 05/18/18  0849     History   Chief Complaint Chief Complaint  Patient presents with  . Chest Pain    HPI Jessica Lawson is a 52 y.o. female.  Patient with history of diabetes, high cholesterol, hypertension on medication, former smoker --presents the emergency department with complaint of chest pressure.  Patient states that she walks about 30 minutes every day.  At the end of her walk today she developed a burning pain in her mid chest with a subsequent pressure in her chest.  Symptoms lasted for about 5 to 10 minutes and improved after she sat down and rested.  She did not have any diaphoresis or vomiting.  No associated shortness of breath, lightheadedness, or syncope.  No abdominal pain.  Pain did not radiate.  No treatments prior to arrival.  Patient checked her blood pressure at home and found it to be high.  This prompted ED visit.  She has not had similar symptoms with exercise recently.  Did take detox cleanse yesterday.  Patient was admitted to the hospital in 2017 and had a normal nuclear stress test. Sister has h/o CHF. Patient denies risk factors for pulmonary embolism including: unilateral leg swelling, history of DVT/PE/other blood clots, use of exogenous hormones, recent immobilizations, recent surgery, recent travel (>4hr segment), malignancy, hemoptysis. The onset of this condition was acute. The course is constant.       Past Medical History:  Diagnosis Date  . Back pain   . Diabetes mellitus without complication ([place])   . High blood cholesterol   . Hypertension   . Kidney stone   . Normal cardiac stress test 02/2016   low risk  . Obesity   . Pain of cheek    and right side neck  . Tobacco use   . UTI (lower urinary tract infection)   . Wrist pain    left wrist    Patient Active Problem List   Diagnosis Date Noted  . Essential hypertension 11/01/2017    . Obesity (BMI 30-39.9) 11/01/2017  . Herpes simplex type 1 infection 12/15/2016  . Chest pain 03/05/2016  . Acute bronchitis 03/05/2016  . Environmental allergies 10/26/2015  . Hypercholesterolemia with hypertriglyceridemia 09/20/2015  . Hyperlipidemia 09/05/2015  . Right shoulder pain 09/05/2015  . Mouth ulcers 09/05/2015    Past Surgical History:  Procedure Laterality Date  . CESAREAN SECTION     3 c-sections     OB History   None      Home Medications    Prior to Admission medications   Medication Sig Start Date End Date Taking? Authorizing Provider  albuterol (PROVENTIL HFA;VENTOLIN HFA) 108 (90 Base) MCG/ACT inhaler Inhale 2 puffs into the lungs every 4 (four) hours as needed for wheezing or shortness of breath. 12/15/16   Dorena Dew, FNP  blood glucose meter kit and supplies KIT Dispense based on patient and insurance preference. Use up to four times daily as directed. (FOR ICD-9 250.00, 250.01). 05/02/18   Lanae Boast, FNP  fenofibrate (TRICOR) 48 MG tablet Take 1 tablet (48 mg total) by mouth daily. 05/02/18 05/02/19  Lanae Boast, FNP  fluticasone (FLONASE) 50 MCG/ACT nasal spray Place 2 sprays into both nostrils daily. 01/29/18   Dorena Dew, FNP  hydrochlorothiazide (HYDRODIURIL) 12.5 MG tablet Take 1 tablet (12.5 mg total) by mouth daily. 05/02/18   Lanae Boast, FNP  loratadine (CLARITIN) 10 MG  tablet Take 1 tablet (10 mg total) by mouth daily. 01/29/18   Dorena Dew, FNP  metFORMIN (GLUCOPHAGE) 500 MG tablet Take 1 tablet (500 mg total) by mouth 2 (two) times daily with a meal. 01/29/18   Dorena Dew, FNP  methocarbamol (ROBAXIN) 500 MG tablet Take 1 tablet (500 mg total) by mouth every 8 (eight) hours as needed. 01/13/18   Petrucelli, Samantha R, PA-C  Multiple Vitamins-Minerals (MULTIVITAMIN WITH MINERALS) tablet Take 1 tablet by mouth daily.    [provider]  omega-3 acid ethyl esters (LOVAZA) 1 g capsule Take 1 capsule (1 g  total) by mouth daily. 05/02/18   Lanae Boast, FNP    Family History Family History  Problem Relation Age of Onset  . Hyperlipidemia Other   . Hypertension Other   . Diabetes Other   . Diabetes Mother   . Lung disease Father   . Diabetes Sister   . Diabetes Sister   . Hypertension Sister     Social History Social History   Tobacco Use  . Smoking status: Former Smoker    Packs/day: 1.00    Types: Cigarettes    Last attempt to quit: 07/26/2016    Years since quitting: 1.8  . Smokeless tobacco: Never Used  Substance Use Topics  . Alcohol use: No  . Drug use: No     Allergies   Asa [aspirin]; Iohexol; Penicillins; Sulfa antibiotics; and Nystatin   Review of Systems Review of Systems  Constitutional: Negative for diaphoresis and fever.  Eyes: Negative for redness.  Respiratory: Negative for cough and shortness of breath.   Cardiovascular: Positive for chest pain. Negative for palpitations and leg swelling.  Gastrointestinal: Negative for abdominal pain, nausea and vomiting.  Genitourinary: Negative for dysuria.  Musculoskeletal: Negative for back pain and neck pain.  Skin: Negative for rash.  Neurological: Negative for syncope and light-headedness.  Psychiatric/Behavioral: The patient is not nervous/anxious.      Physical Exam Updated Vital Signs BP (!) 159/87 (BP Location: Right Arm)   Pulse 94   Temp 98.4 F (36.9 C) (Oral)   Resp 13   Ht '4\' 11"'$  (1.499 m)   Wt 80.3 kg   LMP 08/22/2011 Comment: patient states LMP 20 years ago  SpO2 98%   BMI 35.75 kg/m   Physical Exam  Constitutional: She appears well-developed and well-nourished.  HENT:  Head: Normocephalic and atraumatic.  Mouth/Throat: Mucous membranes are normal. Mucous membranes are not dry.  Eyes: Conjunctivae are normal.  Neck: Trachea normal and normal range of motion. Neck supple. Normal carotid pulses and no JVD present. No muscular tenderness present. Carotid bruit is not present. No  tracheal deviation present.  Cardiovascular: Normal rate, regular rhythm, S1 normal, S2 normal, normal heart sounds and intact distal pulses. Exam reveals no decreased pulses.  No murmur heard. Pulmonary/Chest: Effort normal. No respiratory distress. She has no wheezes. She exhibits no tenderness.  Abdominal: Soft. Normal aorta and bowel sounds are normal. There is no tenderness. There is no rebound and no guarding.  Musculoskeletal: Normal range of motion.       Right lower leg: She exhibits no tenderness and no edema.       Left lower leg: She exhibits no tenderness and no edema.  Neurological: She is alert.  Skin: Skin is warm and dry. She is not diaphoretic. No cyanosis. No pallor.  Psychiatric: She has a normal mood and affect.  Nursing note and vitals reviewed.    ED Treatments /  Results  Labs (all labs ordered are listed, but only abnormal results are displayed) Labs Reviewed  BASIC METABOLIC PANEL - Abnormal; Notable for the following components:      Result Value   Potassium 3.2 (*)    Glucose, Bld 141 (*)    All other components within normal limits  CBC  I-STAT TROPONIN, ED  I-STAT BETA HCG BLOOD, ED ([facility], [facility], [facility] ONLY)  I-STAT TROPONIN, ED    EKG EKG Interpretation  Date/Time:  Friday May 18 2018 09:19:13 EDT Ventricular Rate:  87 PR Interval:    QRS Duration: 91 QT Interval:  480 QTC Calculation: 578 R Axis:   45 Text Interpretation:  Sinus rhythm Abnormal R-wave progression, early transition Borderline T abnormalities, diffuse leads Prolonged QT interval Confirmed by Dene Gentry [phone]) on 05/18/2018 9:48:22 AM Also confirmed by Dene Gentry [phone]), editor Lynder Parents [phone])  on 05/18/2018 11:05:40 AM  ED ECG REPORT   Date: 05/18/2018 13:08  Rate: 76  Rhythm: normal sinus rhythm  QRS Axis: normal  Intervals: normal  ST/T Wave abnormalities: normal  Conduction Disutrbances:none  Narrative Interpretation: early R-wave transition  Old EKG  Reviewed: unchanged  I have personally reviewed the EKG tracing and agree with the computerized printout as noted.   Radiology Dg Chest 2 View  Result Date: 05/18/2018 CLINICAL DATA:  Substernal pressure. Short of breath. Tachycardia. Hypertension. EXAM: CHEST - 2 VIEW COMPARISON:  01/03/2018 FINDINGS: The heart is upper normal in size allowing for AP technique. Lungs are clear. No pneumothorax or pleural effusion. IMPRESSION: No active cardiopulmonary disease. Electronically Signed   By: Marybelle Killings M.D.   On: 05/18/2018 09:35    Procedures Procedures (including critical care time)  Medications Ordered in ED Medications - No data to display   Initial Impression / Assessment and Plan / ED Course  I have reviewed the triage vital signs and the nursing notes.  Pertinent labs & imaging results that were available during my care of the patient were reviewed by me and considered in my medical decision making (see chart for details).     Patient seen and examined. Work-up initiated. No active CP. EKG not yet in MUSE.   Vital signs reviewed and are as follows: BP (!) 159/87 (BP Location: Right Arm)   Pulse 94   Temp 98.4 F (36.9 C) (Oral)   Resp 13   Ht '4\' 11"'$  (1.499 m)   Wt 80.3 kg   LMP 08/22/2011 Comment: patient states LMP 20 years ago  SpO2 98%   BMI 35.75 kg/m   1:12 PM EKGs reviewed. Delta trop pending.   2:32 PM EKG unchanged.  Second troponin is negative.  Discussed with Dr. Francia Greaves.  Encourage patient follow-up with her PCP/cardiologist in the next week for recheck.  She should discontinue strenuous exercise until cleared.  Patient was counseled to return with severe chest pain, especially if the pain is crushing or pressure-like and spreads to the arms, back, neck, or jaw, or if they have sweating, nausea, or shortness of breath with the pain. They were encouraged to call 911 with these symptoms.   They were also told to return if their chest pain gets worse and  does not go away with rest, they have an attack of chest pain lasting longer than usual despite rest and treatment with the medications their caregiver has prescribed, if they wake from sleep with chest pain or shortness of breath, if they feel dizzy or faint, if they have chest pain  not typical of their usual pain, or if they have any other emergent concerns regarding their health.  The patient verbalized understanding and agreed.    Final Clinical Impressions(s) / ED Diagnoses   Final diagnoses:  Atypical chest pain   Patient with a short-lived episode of chest pain and pressure this morning at the end of her daily walk.  She has been able to walk without any pain prior to this.  Troponin negative x2.  EKG nonischemic and unchanged.  No signs of DVT or PE today.  Vital signs are within normal limits.  Chest x-ray is clear.  At this time, do not feel patient requires admission for further work-up however she should follow-up with her primary care doctor and her cardiologist for further management.  She did have a negative nuclear stress test 2 years ago.  Patient seems reliable to return with persistent symptoms or worsening.  ED Discharge Orders    None       Carlisle Cater, Hershal Coria 05/18/18 1434    Valarie Merino, MD 05/19/18 1152

## 2018-05-18 NOTE — ED Notes (Signed)
Patient verbalizes understanding of discharge instructions. Opportunity for questioning and answers were provided. Pt discharged from ED. 

## 2018-05-18 NOTE — Discharge Instructions (Signed)
Please read and follow all provided instructions.  Your diagnoses today include:  1. Atypical chest pain     Tests performed today include:  An EKG of your heart  A chest x-ray  Cardiac enzymes - a blood test for heart muscle damage  Blood counts and electrolytes  Vital signs. See below for your results today.   Medications prescribed:   None  Take any prescribed medications only as directed.  Follow-up instructions: Please follow-up with your primary care provider as soon as you can for further evaluation of your symptoms.   Return instructions:  SEEK IMMEDIATE MEDICAL ATTENTION IF:  You have severe chest pain, especially if the pain is crushing or pressure-like and spreads to the arms, back, neck, or jaw, or if you have sweating, nausea (feeling sick to your stomach), or shortness of breath. THIS IS AN EMERGENCY. Don't wait to see if the pain will go away. Get medical help at once. Call 911 or 0 (operator). DO NOT drive yourself to the hospital.   Your chest pain gets worse and does not go away with rest.   You have an attack of chest pain lasting longer than usual, despite rest and treatment with the medications your caregiver has prescribed.   You wake from sleep with chest pain or shortness of breath.  You feel dizzy or faint.  You have chest pain not typical of your usual pain for which you originally saw your caregiver.   You have any other emergent concerns regarding your health.  Additional Information: Chest pain comes from many different causes. Your caregiver has diagnosed you as having chest pain that is not specific for one problem, but does not require admission.  You are at low risk for an acute heart condition or other serious illness.   Your vital signs today were: BP 121/73    Pulse 76    Temp 98.4 F (36.9 C) (Oral)    Resp (!) 21    Ht 4\' 11"  (1.499 m)    Wt 80.3 kg    LMP 08/22/2011 Comment: patient states LMP 20 years ago   SpO2 96%    BMI 35.75  kg/m  If your blood pressure (BP) was elevated above 135/85 this visit, please have this repeated by your doctor within one month. --------------

## 2018-05-18 NOTE — ED Triage Notes (Addendum)
Pt coming from home via ems; pt walking around house doing light exercise when she began having substernal pressure, non radiating, sob, heart racing; BP 202/110 on ems arrival; hx HTN, has been taking meds as prescribed; initial hr 120; CBG 126, hx diabetes; 98% RA, RR 16, lungs clear w/ ems; no asa given (pt allergic), declined nitro; no cardiac hx , family has cardia hx; per pt CP and SOB have resolved; pot states she has not taken her BP med this AM (hydrochlorathiazide)   170/100 HR 108

## 2018-05-22 ENCOUNTER — Other Ambulatory Visit: Payer: Self-pay | Admitting: Family Medicine

## 2018-05-22 ENCOUNTER — Telehealth: Payer: Self-pay

## 2018-05-22 NOTE — Telephone Encounter (Signed)
Called, no answer. Left a message to remind patient of appointment on 05/23/2018. Thanks!

## 2018-05-23 ENCOUNTER — Encounter: Payer: Self-pay | Admitting: Family Medicine

## 2018-05-23 ENCOUNTER — Ambulatory Visit (INDEPENDENT_AMBULATORY_CARE_PROVIDER_SITE_OTHER): Payer: Medicaid Other | Admitting: Family Medicine

## 2018-05-23 VITALS — BP 156/78 | HR 74 | Temp 98.0°F | Resp 16 | Ht 59.0 in | Wt 175.0 lb

## 2018-05-23 DIAGNOSIS — E785 Hyperlipidemia, unspecified: Secondary | ICD-10-CM

## 2018-05-23 DIAGNOSIS — R079 Chest pain, unspecified: Secondary | ICD-10-CM | POA: Diagnosis not present

## 2018-05-23 DIAGNOSIS — I10 Essential (primary) hypertension: Secondary | ICD-10-CM | POA: Diagnosis not present

## 2018-05-23 DIAGNOSIS — E119 Type 2 diabetes mellitus without complications: Secondary | ICD-10-CM | POA: Diagnosis not present

## 2018-05-23 LAB — POCT URINALYSIS DIPSTICK
Bilirubin, UA: NEGATIVE
Glucose, UA: NEGATIVE
Ketones, UA: NEGATIVE
Leukocytes, UA: NEGATIVE
Nitrite, UA: NEGATIVE
Protein, UA: NEGATIVE
Spec Grav, UA: 1.025 (ref 1.010–1.025)
Urobilinogen, UA: 0.2 E.U./dL
pH, UA: 5.5 (ref 5.0–8.0)

## 2018-05-23 NOTE — Patient Instructions (Signed)

## 2018-05-23 NOTE — Progress Notes (Signed)
Patient [facility]   Progress Note: General Provider: Lanae Boast, FNP  SUBJECTIVE:   Jessica Lawson is a 52 y.o. female who  has a past medical history of Back pain, Diabetes mellitus without complication ([place]), High blood cholesterol, Hypertension, Kidney stone, Normal cardiac stress test (02/2016), Obesity, Pain of cheek, Tobacco use, UTI (lower urinary tract infection), and Wrist pain.. Patient presents today for Follow-up (er follow up for high blood pressure. Needs referal for cardiology ) Patient seen in the ED on 05/18/2018 due to atypical chest pain.  Patient states that she was doing her morning exercises drank a cup of warm water to help her bowels move shortly after she felt pain in her chest.  Patient states that she sat down and stopped her activity and her husband drove her to the ED.  ED provider requested referral to cardiology for further evaluation of atypical chest pain.  Patient presents today for cardiology referral. Review of Systems  Constitutional: Negative.   HENT: Negative.   Eyes: Negative.   Respiratory: Negative.   Cardiovascular: Negative.   Gastrointestinal: Negative.   Genitourinary: Negative.   Musculoskeletal: Negative.   Skin: Negative.   Neurological: Negative.   Psychiatric/Behavioral: Negative.      OBJECTIVE: BP (!) 156/78 (BP Location: Left Arm, Patient Position: Sitting, Cuff Size: Large)   Pulse 74   Temp 98 F (36.7 C) (Oral)   Resp 16   Ht 4\' 11"  (1.499 m)   Wt 175 lb (79.4 kg)   LMP 08/22/2011 Comment: patient states LMP 20 years ago  SpO2 100%   BMI 35.35 kg/m   Physical Exam  Constitutional: She is oriented to person, place, and time. She appears well-developed and well-nourished. No distress.  HENT:  Head: Normocephalic and atraumatic.  Eyes: Pupils are equal, round, and reactive to light. Conjunctivae and EOM are normal.  Neck: Normal range of motion.  Cardiovascular: Normal rate,  regular rhythm, normal heart sounds and intact distal pulses.  Pulmonary/Chest: Effort normal and breath sounds normal. No respiratory distress.  Abdominal: Soft. Bowel sounds are normal. She exhibits no distension.  Musculoskeletal: Normal range of motion.  Neurological: She is alert and oriented to person, place, and time.  Skin: Skin is warm and dry.  Psychiatric: She has a normal mood and affect. Her behavior is normal. Thought content normal.  Nursing note and vitals reviewed.   ASSESSMENT/PLAN:   1. Essential hypertension Patient not currently experiencing chest pain shortness of breath dizziness or leg swelling.  Advised that the atypical chest pain could have been due to indigestion or gas since it occurred after drinking the warm water.  Patient is adamant about being seen by cardiology and his provider will accommodate her wishes.continue with current medications.  Advised not to do strenuous exercise for the next few weeks. Referred to cardiology for further evaluation. - Urinalysis Dipstick - Ambulatory referral to Cardiology  2. Type 2 diabetes mellitus without complication, without long-term current use of insulin ([facility]CC) The current medical regimen is effective;  continue present plan and medications.  - Ambulatory referral to Cardiology  3. Hyperlipidemia, unspecified hyperlipidemia type The current medical regimen is effective;  continue present plan and medications.  - Ambulatory referral to Cardiology  4. Chest pain, unspecified type - Ambulatory referral to Cardiology         The patient was given clear instructions to go to ER or return to medical center if symptoms do not improve, worsen or  new problems develop. The patient verbalized understanding and agreed with plan of care.   Jessica Lawson. Jessica Canary, FNP-BC [facility] [address] [place], [place] [address] [phone]     This note has been created with  Dragon speech recognition software and smart phrase technology. Any transcriptional errors are unintentional.

## 2018-05-28 ENCOUNTER — Other Ambulatory Visit: Payer: Self-pay | Admitting: Family Medicine

## 2018-05-28 DIAGNOSIS — E785 Hyperlipidemia, unspecified: Secondary | ICD-10-CM

## 2018-06-06 ENCOUNTER — Ambulatory Visit (INDEPENDENT_AMBULATORY_CARE_PROVIDER_SITE_OTHER): Payer: Medicaid Other | Admitting: Internal Medicine

## 2018-06-06 ENCOUNTER — Encounter: Payer: Self-pay | Admitting: Internal Medicine

## 2018-06-06 VITALS — BP 110/70 | HR 88 | Ht 59.0 in | Wt 174.2 lb

## 2018-06-06 DIAGNOSIS — R0789 Other chest pain: Secondary | ICD-10-CM | POA: Diagnosis not present

## 2018-06-06 DIAGNOSIS — R0683 Snoring: Secondary | ICD-10-CM

## 2018-06-06 DIAGNOSIS — I10 Essential (primary) hypertension: Secondary | ICD-10-CM | POA: Diagnosis not present

## 2018-06-06 NOTE — Consult Note (Signed)
Cardiology Office Note:    Date:  06/06/2018   ID:  Jessica Lawson, DOB 1966-01-16, MRN [id number]  PCP:  Lanae Boast, [place]  Cardiologist:  Elouise Munroe, MD  Electrophysiologist:  None   Referring MD: Lanae Boast, FNP   Chest pain and hypertension  History of Present Illness:    Jessica Lawson is a 52 y.o. female with a hx of DM2 on oral therapy, hyperlipidemia, hypertension who presents today after an episode of chest pain for which she presented to the ED. She states that she was doing her morning exercises and drank a cup of warm water to help her bowels move. Shortly after she felt pain in her chest which she describes as her chest caving in.  She states that she sat down and stopped her activity and her boyfriend drove her to the ED. Blood glucose was normal at the time. She ruled out for MI in the ED. She reports blood pressures in he 180s-200s during her episode of chest pain.   She has a 34 pk year smoking history but quit at the age of 48. I commended her for this. She does not drink alcohol. Denies recreational drug use. Denies using any herbal supplements. Tried drinking Lipton Detox tea the night before this episode.   She denies dyspnea at rest or with exertion, PND, orthopnea, or leg swelling. Denies syncope or presyncope. Occasional palpitation.   She snores and has never been evaluated for sleep apnea.  She scores 9 points on the Epworth sleepiness scale.  She had a normal myoview stress test and a normal echocardiogram in July of 2017.    Past Medical History:  Diagnosis Date  . Back pain   . Diabetes mellitus without complication ([place])   . High blood cholesterol   . Hypertension   . Kidney stone   . Normal cardiac stress test 02/2016   low risk  . Obesity   . Pain of cheek    and right side neck  . Tobacco use   . UTI (lower urinary tract infection)   . Wrist pain    left wrist    Past Surgical History:  Procedure Laterality Date  . CESAREAN  SECTION     3 c-sections    Current Medications: Current Meds  Medication Sig  . ACCU-CHEK AVIVA PLUS test strip USE UP TO FOUR TIMES DAILY AS DIRECTED.  Marland Kitchen ACCU-CHEK SOFTCLIX LANCETS lancets USE UP TO FOUR TIMES DAILY AS DIRECTED.  Marland Kitchen blood glucose meter kit and supplies KIT Dispense based on patient and insurance preference. Use up to four times daily as directed. (FOR ICD-9 250.00, 250.01).  . fenofibrate (TRICOR) 48 MG tablet Take 1 tablet (48 mg total) by mouth daily.  . hydrochlorothiazide (HYDRODIURIL) 12.5 MG tablet Take 1 tablet (12.5 mg total) by mouth daily.  Marland Kitchen loratadine (CLARITIN) 10 MG tablet Take 1 tablet (10 mg total) by mouth daily. (Patient taking differently: Take 10 mg by mouth daily as needed for allergies. )  . metFORMIN (GLUCOPHAGE) 500 MG tablet Take 1 tablet (500 mg total) by mouth 2 (two) times daily with a meal. (Patient taking differently: Take 500 mg by mouth 1 day or 1 dose. )  . Multiple Vitamins-Minerals (MULTIVITAMIN WITH MINERALS) tablet Take 1 tablet by mouth daily.  Marland Kitchen omega-3 acid ethyl esters (LOVAZA) 1 g capsule Take 1 capsule (1 g total) by mouth daily.     Allergies:   Asa [aspirin]; Iohexol; Penicillins; Sulfa antibiotics; and Nystatin  Social History   Socioeconomic History  . Marital status: Single    Spouse name: Not on file  . Number of children: Not on file  . Years of education: Not on file  . Highest education level: Not on file  Occupational History  . Not on file  Social Needs  . Financial resource strain: Not on file  . Food insecurity:    Worry: Not on file    Inability: Not on file  . Transportation needs:    Medical: Not on file    Non-medical: Not on file  Tobacco Use  . Smoking status: Former Smoker    Packs/day: 1.00    Types: Cigarettes    Last attempt to quit: 07/26/2016    Years since quitting: 1.8  . Smokeless tobacco: Never Used  Substance and Sexual Activity  . Alcohol use: No  . Drug use: No  . Sexual  activity: Not on file  Lifestyle  . Physical activity:    Days per week: Not on file    Minutes per session: Not on file  . Stress: Not on file  Relationships  . Social connections:    Talks on phone: Not on file    Gets together: Not on file    Attends religious service: Not on file    Active member of club or organization: Not on file    Attends meetings of clubs or organizations: Not on file    Relationship status: Not on file  Other Topics Concern  . Not on file  Social History Narrative  . Not on file     Family History: The patient's family history includes Diabetes in her mother, other, sister, and sister; Hyperlipidemia in her other; Hypertension in her other and sister; Lung disease in her father. No strong family history of CAD or sudden death.  ROS:   Please see the history of present illness.    All other systems reviewed and are negative.  EKGs/Labs/Other Studies Reviewed:    The following studies were reviewed today:  EKG:  EKG is ordered today.  The ekg ordered today demonstrates normal sinus rhythm with nonspecific T wave abnormality.  Recent Labs: 10/27/2017: TSH 0.793 05/02/2018: ALT 29 05/18/2018: BUN 14; Creatinine, Ser 0.89; Hemoglobin 14.3; Platelets 356; Potassium 3.2; Sodium 139  Recent Lipid Panel    Component Value Date/Time   CHOL 355 (H) 05/02/2018 1136   TRIG 298 (H) 05/02/2018 1136   HDL 41 05/02/2018 1136   CHOLHDL 8.7 (H) 05/02/2018 1136   CHOLHDL 7.3 (H) 07/12/2017 0950   VLDL 50 (H) 04/06/2017 0902   LDLCALC 254 (H) 05/02/2018 1136   LDLCALC 183 (H) 07/12/2017 0950    Physical Exam:    VS:  BP 110/70   Pulse 88   Ht '4\' 11"'$  (1.499 m)   Wt 174 lb 3.2 oz (79 kg)   LMP 08/22/2011 Comment: patient states LMP 20 years ago  BMI 35.18 kg/m     Wt Readings from Last 3 Encounters:  06/06/18 174 lb 3.2 oz (79 kg)  05/23/18 175 lb (79.4 kg)  05/18/18 177 lb (80.3 kg)     GEN:  Well nourished, well developed in no acute  distress HEENT: Normal NECK: No JVD; No carotid bruits LYMPHATICS: No lymphadenopathy CARDIAC: RRR, no murmurs, rubs, gallops RESPIRATORY:  Clear to auscultation without rales, wheezing or rhonchi  ABDOMEN: Soft, non-tender, non-distended MUSCULOSKELETAL:  No edema; No deformity  SKIN: Warm and dry NEUROLOGIC:  Alert and oriented x  3 PSYCHIATRIC:  Normal affect   ASSESSMENT:    1. Essential hypertension   2. Snoring   3. Atypical chest pain    PLAN:    In order of problems listed above:  She has had recent testing with Myoview stress and echocardiogram in 2017 which were normal. This is reassuring. I have instructed her to contact us if she has recurrent chest pain, but I do not believe further workup is required at this time. She understands and if she has further chest pain, we can consider coronary CTA or repeat stress testing at that time.   She has hypertension, and I would like to get a better sense of her daily blood pressure so we can optimize therapy. We will obtain a 24 hour ambulatory blood pressure monitor to assess daily control. Her epsiode may be related to elevated blood pressure causing chest pain.   For her snoring, we will make a referral to the sleep center for testing. We discussed the pathology of sleep apnea and need to treat if diagnosed.  I will see her back in 3 months to determine next best steps.  Medication Adjustments/Labs and Tests Ordered: Current medicines are reviewed at length with the patient today.  Concerns regarding medicines are outlined above.  Orders Placed This Encounter  Procedures  . Holter monitor - 24 hour  . EKG 12-Lead  . Split night study   No orders of the defined types were placed in this encounter.   Patient Instructions  Medication Instructions:  Your physician recommends that you continue on your current medications as directed. Please refer to the Current Medication list given to you today.  If you need a refill on  your cardiac medications before your next appointment, please call your pharmacy.   Lab work: None ordered   Testing/Procedures: Your physician has recommended that you wear a ambulatory blood pressure monitor   Your physician has recommended that you have a sleep study. This test records several body functions during sleep, including: brain activity, eye movement, oxygen and carbon dioxide blood levels, heart rate and rhythm, breathing rate and rhythm, the flow of air through your mouth and nose, snoring, body muscle movements, and chest and belly movement.    Follow-Up: At [facility], you and your health needs are our priority.  As part of our continuing mission to provide you with exceptional heart care, we have created designated Provider Care Teams.  These Care Teams include your primary Cardiologist (physician) and Advanced Practice Providers (APPs -  Physician Assistants and Nurse Practitioners) who all work together to provide you with the care you need, when you need it. You will need a follow up appointment in 3 months.  Please call our office 2 months in advance to schedule this appointment.  You may see Elouise Munroe, MD or one of the following Advanced Practice Providers on your designated Care Team:   Rosaria Ferries, PA-C . Jory Sims, DNP, ANP  Any Other Special Instructions Will Be Listed Below (If Applicable).       Signed, Elouise Munroe, MD  06/06/2018 5:49 PM    [place]

## 2018-06-06 NOTE — Patient Instructions (Signed)
Medication Instructions:  Your physician recommends that you continue on your current medications as directed. Please refer to the Current Medication list given to you today.  If you need a refill on your cardiac medications before your next appointment, please call your pharmacy.   Lab work: None ordered   Testing/Procedures: Your physician has recommended that you wear a ambulatory blood pressure monitor   Your physician has recommended that you have a sleep study. This test records several body functions during sleep, including: brain activity, eye movement, oxygen and carbon dioxide blood levels, heart rate and rhythm, breathing rate and rhythm, the flow of air through your mouth and nose, snoring, body muscle movements, and chest and belly movement.    Follow-Up: At [facility], you and your health needs are our priority.  As part of our continuing mission to provide you with exceptional heart care, we have created designated Provider Care Teams.  These Care Teams include your primary Cardiologist (physician) and Advanced Practice Providers (APPs -  Physician Assistants and Nurse Practitioners) who all work together to provide you with the care you need, when you need it. You will need a follow up appointment in 3 months.  Please call our office 2 months in advance to schedule this appointment.  You may see Elouise Munroe, MD or one of the following Advanced Practice Providers on your designated Care Team:   Rosaria Ferries, PA-C . Jory Sims, DNP, ANP  Any Other Special Instructions Will Be Listed Below (If Applicable).

## 2018-06-07 ENCOUNTER — Telehealth: Payer: Self-pay | Admitting: *Deleted

## 2018-06-07 NOTE — Telephone Encounter (Signed)
PA for sleep study submitted to [employer] via web portal. Clinicals faxed to [phone]. 

## 2018-06-07 NOTE — Telephone Encounter (Signed)
-----   Message from Lamar Laundry, RN sent at 06/06/2018  3:48 PM EDT ----- Regarding: pt needs to be schduled for a sleep study Per Dr.Acharya this pt needs to be scheduled for a sleep study.

## 2018-06-14 ENCOUNTER — Other Ambulatory Visit: Payer: Self-pay | Admitting: Family Medicine

## 2018-06-18 ENCOUNTER — Other Ambulatory Visit: Payer: Self-pay | Admitting: Cardiovascular Disease

## 2018-06-18 ENCOUNTER — Telehealth: Payer: Self-pay | Admitting: *Deleted

## 2018-06-18 DIAGNOSIS — R0683 Snoring: Secondary | ICD-10-CM

## 2018-06-18 DIAGNOSIS — I10 Essential (primary) hypertension: Secondary | ICD-10-CM

## 2018-06-18 DIAGNOSIS — E669 Obesity, unspecified: Secondary | ICD-10-CM

## 2018-06-18 NOTE — Telephone Encounter (Signed)
-----   Message from Lamar Laundry, RN sent at 06/06/2018  3:48 PM EDT ----- Regarding: pt needs to be schduled for a sleep study Per Dr.Acharya this pt needs to be scheduled for a sleep study.

## 2018-06-18 NOTE — Telephone Encounter (Signed)
Patient notified of sleep study appointment scheduled for 07/30/18.

## 2018-06-19 ENCOUNTER — Ambulatory Visit (INDEPENDENT_AMBULATORY_CARE_PROVIDER_SITE_OTHER): Payer: Medicaid Other

## 2018-06-19 ENCOUNTER — Encounter: Payer: Self-pay | Admitting: *Deleted

## 2018-06-19 DIAGNOSIS — I10 Essential (primary) hypertension: Secondary | ICD-10-CM | POA: Diagnosis not present

## 2018-06-19 NOTE — Progress Notes (Signed)
Patient ID: Jessica Lawson, female   DOB: 31-Aug-1965, 52 y.o.   MRN: [id number] 24 hour ambulatory blood pressure monitor applied to patient using adult large cuff.

## 2018-06-27 ENCOUNTER — Telehealth: Payer: Self-pay

## 2018-06-27 NOTE — Telephone Encounter (Signed)
Patient aware that her Amb BP report has been reviewed by Dr.Acharya.  Per Dr.Acharya No significant sustained elevated BP at rest, awake, or asleep. Occasional mild elevations. No changes needed.  Pt is scheduled for a her sleep study in Dec 2019 and a f/u appt with Dr.Acharya in Jan 2020. Adv pt to f/u as planned. Pt verbalized understanding and voiced appreciation for the call.    BP monitor results placed on interoffice envelope to be sent back to [facility]

## 2018-07-16 ENCOUNTER — Other Ambulatory Visit: Payer: Self-pay | Admitting: Family Medicine

## 2018-07-30 ENCOUNTER — Encounter (HOSPITAL_BASED_OUTPATIENT_CLINIC_OR_DEPARTMENT_OTHER): Payer: 59

## 2018-08-01 ENCOUNTER — Ambulatory Visit (INDEPENDENT_AMBULATORY_CARE_PROVIDER_SITE_OTHER): Payer: Medicaid Other | Admitting: Family Medicine

## 2018-08-01 ENCOUNTER — Encounter: Payer: Self-pay | Admitting: Family Medicine

## 2018-08-01 VITALS — BP 144/78 | HR 65 | Temp 97.7°F | Ht 59.0 in | Wt 173.0 lb

## 2018-08-01 DIAGNOSIS — J3489 Other specified disorders of nose and nasal sinuses: Secondary | ICD-10-CM

## 2018-08-01 DIAGNOSIS — I10 Essential (primary) hypertension: Secondary | ICD-10-CM | POA: Diagnosis not present

## 2018-08-01 DIAGNOSIS — E119 Type 2 diabetes mellitus without complications: Secondary | ICD-10-CM | POA: Diagnosis not present

## 2018-08-01 DIAGNOSIS — Z09 Encounter for follow-up examination after completed treatment for conditions other than malignant neoplasm: Secondary | ICD-10-CM | POA: Diagnosis not present

## 2018-08-01 LAB — POCT URINALYSIS DIP (MANUAL ENTRY)
Bilirubin, UA: NEGATIVE
Glucose, UA: NEGATIVE mg/dL
Ketones, POC UA: NEGATIVE mg/dL
Leukocytes, UA: NEGATIVE
Nitrite, UA: NEGATIVE
Protein Ur, POC: NEGATIVE mg/dL
Spec Grav, UA: >=1.03 — AB (ref 1.010–1.025)
Urobilinogen, UA: 0.2 E.U./dL
pH, UA: 5.5 (ref 5.0–8.0)

## 2018-08-01 LAB — POCT GLYCOSYLATED HEMOGLOBIN (HGB A1C): Hemoglobin A1C: 5.9 % — AB (ref 4.0–5.6)

## 2018-08-01 MED ORDER — SALINE SPRAY 0.65 % NA SOLN: 1.0000 | NASAL | 2 refills | Status: DC | PRN

## 2018-08-01 NOTE — Progress Notes (Signed)
Follow Up  Subjective:    Patient ID: Jessica Lawson, female    DOB: 1965-11-05, 53 y.o.   MRN: [id number]  Chief Complaint  Patient presents with  . Facial Pain  . Itchy skin  . Nasal Congestion  . Cough   HPI  [name] is a 52 year old female with a past medical history of Wrist Pain, UTI, Tobacco Use, Obesity, Kidney Stone, Hypertension, Hyperlipidemia, Diabetes, and Back Pain.   Current Status: Since her last office visit, she c/o of mild sinus pressure and congestion, and running nose for a few days now. She states that she is not using her Flonase because of nose bleeds. She is not taking Loratadine. She does have frequent urination. She denies fatigue, blurred vision, excessive hunger, excessive thirst, weight gain, weight loss, and poor wound healing. She reports that she eats 6-8 small meals daily. Her normal blood sugar ranges between 90s-140s. She regularly takes her blood glucose 3 times daily. She denies visual changes, chest pain, cough, shortness of breath, heart palpitations, and falls. She has occasionally headaches and dizziness with position changes. Denies severe headaches, confusion, seizures, double vision, and blurred vision, nausea and vomiting.  She denies fevers, chills, fatigue, recent infections, weight loss, and night sweats. No reports of GI problems such as nausea, vomiting, diarrhea, and constipation. She has no reports of blood in stools, dysuria and hematuria. No depression or anxiety reported. She denies pain today.   Review of Systems  Constitutional: Negative.   HENT: Positive for congestion (sinus), rhinorrhea and sinus pressure.   Eyes: Negative.   Respiratory: Negative.   Cardiovascular: Negative.   Gastrointestinal: Negative.   Endocrine: Positive for polyuria.  Genitourinary: Negative.   Musculoskeletal: Negative.   Skin: Negative.   Allergic/Immunologic: Negative.   Neurological: Negative.   Hematological: Negative.    Psychiatric/Behavioral: Negative.    Objective:   Physical Exam Vitals signs and nursing note reviewed.  Constitutional:      Appearance: Normal appearance. She is obese.  HENT:     Head: Normocephalic and atraumatic.     Right Ear: Tympanic membrane, ear canal and external ear normal.     Left Ear: Tympanic membrane, ear canal and external ear normal.     Nose: Nose normal.     Mouth/Throat:     Mouth: Mucous membranes are moist.     Pharynx: Oropharynx is clear.  Eyes:     Extraocular Movements: Extraocular movements intact.     Conjunctiva/sclera: Conjunctivae normal.     Pupils: Pupils are equal, round, and reactive to light.  Neck:     Musculoskeletal: Normal range of motion and neck supple.  Cardiovascular:     Rate and Rhythm: Normal rate and regular rhythm.     Pulses: Normal pulses.     Heart sounds: Normal heart sounds.  Pulmonary:     Effort: Pulmonary effort is normal.     Breath sounds: Normal breath sounds.  Abdominal:     General: Bowel sounds are normal. There is distension (Obese).     Palpations: Abdomen is soft.  Musculoskeletal: Normal range of motion.  Skin:    General: Skin is warm and dry.  Neurological:     General: No focal deficit present.     Mental Status: She is alert and oriented to person, place, and time.  Psychiatric:        Mood and Affect: Mood normal.        Behavior: Behavior normal.  Thought Content: Thought content normal.        Judgment: Judgment normal.    Assessment & Plan:   1. Essential hypertension Antihypertensive medications are effective. Blood pressure is 144/78 today. Continue Tricor and HCTZ as prescribed. She will continue to decrease high sodium intake, excessive alcohol intake, increase potassium intake, smoking cessation, and increase physical activity of at least 30 minutes of cardio activity daily. She will continue to follow Heart Healthy or DASH diet. - POCT urinalysis dipstick  2. Type 2 diabetes  mellitus without complication, without long-term current use of insulin (HCC) Hgb A1c mildly decreased at 5.9 today, from 6.8 on 01/29/2018. Continue Metformin as prescribed.  She will continue to decrease foods/beverages high in sugars and carbs and follow Heart Healthy or DASH diet. Increase physical activity to at least 30 minutes cardio exercise daily.  - HgB A1c  3. Sinus pressure We will initiate Saline Nasal Spray today. She will use as directed. She will increase fluids and Loratadine as prescribed.  - sodium chloride (OCEAN) 0.65 % SOLN nasal spray; Place 1 spray into both nostrils as needed for congestion.  Dispense: 1 Bottle; Refill: 2  4. Follow up She will follow up with Venora Maples in 3 months.   Meds ordered this encounter  Medications  . sodium chloride (OCEAN) 0.65 % SOLN nasal spray    Sig: Place 1 spray into both nostrils as needed for congestion.    Dispense:  1 Bottle    Refill:  [place],  MSN, FNP-C Patient [place] [address] [place],  [phone]

## 2018-08-06 ENCOUNTER — Other Ambulatory Visit: Payer: Self-pay

## 2018-08-06 ENCOUNTER — Ambulatory Visit (INDEPENDENT_AMBULATORY_CARE_PROVIDER_SITE_OTHER): Payer: Medicaid Other | Admitting: Family Medicine

## 2018-08-06 ENCOUNTER — Encounter: Payer: Self-pay | Admitting: Family Medicine

## 2018-08-06 VITALS — BP 144/79 | HR 93 | Temp 97.6°F | Resp 14 | Ht 59.0 in | Wt 175.0 lb

## 2018-08-06 DIAGNOSIS — J3489 Other specified disorders of nose and nasal sinuses: Secondary | ICD-10-CM

## 2018-08-06 DIAGNOSIS — J0100 Acute maxillary sinusitis, unspecified: Secondary | ICD-10-CM | POA: Diagnosis not present

## 2018-08-06 MED ORDER — SALINE SPRAY 0.65 % NA SOLN: 1.0000 | NASAL | 2 refills | Status: DC | PRN

## 2018-08-06 MED ORDER — DOXYCYCLINE HYCLATE 100 MG PO TABS: 100.0000 mg | ORAL_TABLET | Freq: Two times a day (BID) | ORAL | 0 refills | Status: AC

## 2018-08-06 MED ORDER — PREDNISONE 20 MG PO TABS: 40.0000 mg | ORAL_TABLET | Freq: Every day | ORAL | 0 refills | Status: AC

## 2018-08-06 MED ORDER — CETIRIZINE HCL 10 MG PO TABS: 10.0000 mg | ORAL_TABLET | Freq: Every day | ORAL | 11 refills | Status: DC

## 2018-08-06 NOTE — Patient Instructions (Signed)
Sinusitis, Adult Sinusitis is soreness and swelling (inflammation) of your sinuses. Sinuses are hollow spaces in the bones around your face. They are located:  Around your eyes.  In the middle of your forehead.  Behind your nose.  In your cheekbones. Your sinuses and nasal passages are lined with a fluid called mucus. Mucus drains out of your sinuses. Swelling can trap mucus in your sinuses. This lets germs (bacteria, virus, or fungus) grow, which leads to infection. Most of the time, this condition is caused by a virus. What are the causes? This condition is caused by:  Allergies.  Asthma.  Germs.  Things that block your nose or sinuses.  Growths in the nose (nasal polyps).  Chemicals or irritants in the air.  Fungus (rare). What increases the risk? You are more likely to develop this condition if:  You have a weak body defense system (immune system).  You do a lot of swimming or diving.  You use nasal sprays too much.  You smoke. What are the signs or symptoms? The main symptoms of this condition are pain and a feeling of pressure around the sinuses. Other symptoms include:  Stuffy nose (congestion).  Runny nose (drainage).  Swelling and warmth in the sinuses.  Headache.  Toothache.  A cough that may get worse at night.  Mucus that collects in the throat or the back of the nose (postnasal drip).  Being unable to smell and taste.  Being very tired (fatigue).  A fever.  Sore throat.  Bad breath. How is this diagnosed? This condition is diagnosed based on:  Your symptoms.  Your medical history.  A physical exam.  Tests to find out if your condition is short-term (acute) or long-term (chronic). Your doctor may: ? Check your nose for growths (polyps). ? Check your sinuses using a tool that has a light (endoscope). ? Check for allergies or germs. ? Do imaging tests, such as an MRI or CT scan. How is this treated? Treatment for this condition  depends on the cause and whether it is short-term or long-term.  If caused by a virus, your symptoms should go away on their own within 10 days. You may be given medicines to relieve symptoms. They include: ? Medicines that shrink swollen tissue in the nose. ? Medicines that treat allergies (antihistamines). ? A spray that treats swelling of the nostrils. ? Rinses that help get rid of thick mucus in your nose (nasal saline washes).  If caused by bacteria, your doctor may wait to see if you will get better without treatment. You may be given antibiotic medicine if you have: ? A very bad infection. ? A weak body defense system.  If caused by growths in the nose, you may need to have surgery. Follow these instructions at home: Medicines  Take, use, or apply over-the-counter and prescription medicines only as told by your doctor. These may include nasal sprays.  If you were prescribed an antibiotic medicine, take it as told by your doctor. Do not stop taking the antibiotic even if you start to feel better. Hydrate and humidify   Drink enough water to keep your pee (urine) pale yellow.  Use a cool mist humidifier to keep the humidity level in your home above 50%.  Breathe in steam for 10-15 minutes, 3-4 times a day, or as told by your doctor. You can do this in the bathroom while a hot shower is running.  Try not to spend time in cool or dry air.   Rest  Rest as much as you can.  Sleep with your head raised (elevated).  Make sure you get enough sleep each night. General instructions   Put a warm, moist washcloth on your face 3-4 times a day, or as often as told by your doctor. This will help with discomfort.  Wash your hands often with soap and water. If there is no soap and water, use hand sanitizer.  Do not smoke. Avoid being around people who are smoking (secondhand smoke).  Keep all follow-up visits as told by your doctor. This is important. Contact a doctor if:  You  have a fever.  Your symptoms get worse.  Your symptoms do not get better within 10 days. Get help right away if:  You have a very bad headache.  You cannot stop throwing up (vomiting).  You have very bad pain or swelling around your face or eyes.  You have trouble seeing.  You feel confused.  Your neck is stiff.  You have trouble breathing. Summary  Sinusitis is swelling of your sinuses. Sinuses are hollow spaces in the bones around your face.  This condition is caused by tissues in your nose that become inflamed or swollen. This traps germs. These can lead to infection.  If you were prescribed an antibiotic medicine, take it as told by your doctor. Do not stop taking it even if you start to feel better.  Keep all follow-up visits as told by your doctor. This is important. This information is not intended to replace advice given to you by your health care provider. Make sure you discuss any questions you have with your health care provider. Document Released: 01/18/2008 Document Revised: 01/01/2018 Document Reviewed: 01/01/2018 Elsevier Interactive Patient Education  2019 Elsevier Inc.   How to Perform a Sinus Rinse A sinus rinse is a home treatment. It rinses your sinuses with a mixture of salt and water (saline solution). Sinuses are air-filled spaces in your skull behind the bones of your face and forehead. They open into your nasal cavity. A sinus rinse can help to clear your nasal cavity. It can clear mucus, dirt, dust, or pollen. You may do a sinus rinse when you have:  A cold.  A virus.  Allergies.  A sinus infection.  A stuffy nose. Talk with your doctor about whether a sinus rinse might help you. What are the risks? A sinus rinse is normally very safe and helpful. However, there are a few risks. These include:  A burning feeling in the sinuses. This may happen if you do not make the saline solution as instructed. Be sure to follow all directions when  making the saline solution.  Nasal irritation.  Infection from unclean water. This is rare, but possible. Do not do a sinus rinse if you have had:  Ear or nasal surgery.  An ear infection.  Blocked ears. Supplies needed:  Saline solution or powder.  Distilled or germ-free (sterile) water may be needed to mix with saline powder. ? You may use boiled and cooled tap water. Boil tap water for 5 minutes; cool until it is lukewarm. Use within 24 hours. ? Do not use regular tap water to mix with the saline solution.  Neti pot or nasal rinse bottle. This releases the saline solution into your nose and through your sinuses. You can buy neti pots and rinse bottles: ? At your local pharmacy. ? At a health food store. ? Online. How to perform a sinus rinse  1. Wash   your hands with soap and water. 2. Wash your device using the directions that came with it. 3. Dry your device. 4. Use the solution that comes with your device or one that is sold separately in stores. Follow the mixing directions on the package if you need to mix with sterile or distilled water. 5. Fill your device with the amount of saline solution stated in the device instructions. 6. Stand over a sink and tilt your head sideways over the sink. 7. Place the spout of the device in your upper nostril (the one closer to the ceiling). 8. Gently pour or squeeze the saline solution into your nasal cavity. The liquid should drain to your lower nostril if you are not too stuffed up (congested). 9. While rinsing, breathe through your open mouth. 10. Gently blow your nose to clear any mucus and rinse solution. Blowing too hard may cause ear pain. 11. Repeat in your other nostril. 12. Clean and rinse your device with clean water. 13. Air-dry your device. Talk with your doctor or pharmacist if you have questions about how to do a sinus rinse. Summary  A sinus rinse is a home treatment. It rinses your sinuses with a mixture of salt and  water (saline solution).  A sinus rinse is normally very safe and helpful. Follow all instructions carefully.  Talk with your doctor about whether a sinus rinse might help you. This information is not intended to replace advice given to you by your health care provider. Make sure you discuss any questions you have with your health care provider. Document Released: 02/26/2014 Document Revised: 05/29/2017 Document Reviewed: 05/29/2017 Elsevier Interactive Patient Education  2019 Elsevier Inc.  

## 2018-08-06 NOTE — Progress Notes (Signed)
Patient [facility]   Progress Note: Sick Visit Provider: Lanae Boast, FNP  SUBJECTIVE:   Jessica Lawson is a 52 y.o. female who  has a past medical history of Back pain, Diabetes mellitus without complication ([place]), High blood cholesterol, Hypertension, Kidney stone, Normal cardiac stress test (02/2016), Obesity, Pain of cheek, Tobacco use, UTI (lower urinary tract infection), and Wrist pain.. Patient presents today for Nasal Congestion (green nasal mucus ) nasal congestion, sinus pain and sore throat x 1 week. Has not taken any medications for this. Cannot take flonase without nose bleeds. Denies cougn or chest congestion.  Review of Systems  Constitutional: Negative.  Negative for chills and fever.  HENT: Positive for congestion, sinus pain and sore throat.   Eyes: Negative.   Respiratory: Positive for cough and sputum production.   Cardiovascular: Negative.   Gastrointestinal: Negative.   Genitourinary: Negative.   Musculoskeletal: Negative.   Skin: Negative.   Neurological: Negative.   Psychiatric/Behavioral: Negative.      OBJECTIVE: BP (!) 144/79 (BP Location: Left Arm, Patient Position: Sitting, Cuff Size: Normal)   Pulse 93   Temp 97.6 F (36.4 C) (Oral)   Resp 14   Ht 4\' 11"  (1.499 m)   Wt 175 lb (79.4 kg)   LMP 08/22/2011 Comment: patient states LMP 20 years ago  SpO2 98%   BMI 35.35 kg/m   Wt Readings from Last 3 Encounters:  08/06/18 175 lb (79.4 kg)  08/01/18 173 lb (78.5 kg)  06/06/18 174 lb 3.2 oz (79 kg)     Physical Exam Vitals signs and nursing note reviewed.  Constitutional:      General: She is not in acute distress.    Appearance: She is well-developed.  HENT:     Head: Normocephalic and atraumatic.     Nose: Congestion and rhinorrhea present.  Eyes:     Conjunctiva/sclera: Conjunctivae normal.     Pupils: Pupils are equal, round, and reactive to light.  Neck:     Musculoskeletal: Normal range of  motion.  Cardiovascular:     Rate and Rhythm: Normal rate and regular rhythm.     Heart sounds: Normal heart sounds.  Pulmonary:     Effort: Pulmonary effort is normal. No respiratory distress.     Breath sounds: Normal breath sounds.  Abdominal:     General: Bowel sounds are normal. There is no distension.     Palpations: Abdomen is soft.  Musculoskeletal: Normal range of motion.  Skin:    General: Skin is warm and dry.  Neurological:     Mental Status: She is alert and oriented to person, place, and time.  Psychiatric:        Behavior: Behavior normal.        Thought Content: Thought content normal.     ASSESSMENT/PLAN:   1. Acute non-recurrent maxillary sinusitis - doxycycline (VIBRA-TABS) 100 MG tablet; Take 1 tablet (100 mg total) by mouth 2 (two) times daily for 7 days.  Dispense: 14 tablet; Refill: 0 - cetirizine (ZYRTEC) 10 MG tablet; Take 1 tablet (10 mg total) by mouth daily.  Dispense: 30 tablet; Refill: 11 - predniSONE (DELTASONE) 20 MG tablet; Take 2 tablets (40 mg total) by mouth daily with breakfast for 3 days.  Dispense: 6 tablet; Refill: 0        The patient was given clear instructions to go to ER or return to medical center if symptoms do not improve, worsen or new problems develop.  The patient verbalized understanding and agreed with plan of care.   [name], FNP-BC [facility] [address] [place], [place] [address] [phone]     This note has been created with Dragon speech recognition software and smart phrase technology. Any transcriptional errors are unintentional.

## 2018-08-27 ENCOUNTER — Telehealth: Payer: Self-pay

## 2018-08-27 NOTE — Telephone Encounter (Signed)
Pt was scheduled to see Dr.Acharya 09/10/18 @10 :20 am. This is the provider qtr day and the appt was scheduled incorreclty. Lmtcb. Pt can be rescheduled for 09/11/18.

## 2018-08-29 NOTE — Telephone Encounter (Signed)
2nd attempt to contact pt. lmtcb. Pt sch appt with Dr.Acharya will need to be rescheduled.

## 2018-09-07 NOTE — Telephone Encounter (Signed)
3rd to contact the pt appt on 1/27 @10am  with Dr.Achraya is cancelled. Pt is to contact the office to reschedule.

## 2018-09-10 ENCOUNTER — Ambulatory Visit: Payer: 59 | Admitting: Internal Medicine

## 2018-09-11 ENCOUNTER — Ambulatory Visit (INDEPENDENT_AMBULATORY_CARE_PROVIDER_SITE_OTHER): Payer: Medicaid Other | Admitting: Internal Medicine

## 2018-09-11 ENCOUNTER — Encounter: Payer: Self-pay | Admitting: Internal Medicine

## 2018-09-11 VITALS — BP 132/90 | HR 74 | Ht 59.0 in | Wt 172.8 lb

## 2018-09-11 DIAGNOSIS — Z789 Other specified health status: Secondary | ICD-10-CM

## 2018-09-11 DIAGNOSIS — R0789 Other chest pain: Secondary | ICD-10-CM | POA: Diagnosis not present

## 2018-09-11 DIAGNOSIS — R0683 Snoring: Secondary | ICD-10-CM | POA: Diagnosis not present

## 2018-09-11 DIAGNOSIS — I10 Essential (primary) hypertension: Secondary | ICD-10-CM

## 2018-09-11 DIAGNOSIS — E782 Mixed hyperlipidemia: Secondary | ICD-10-CM | POA: Diagnosis not present

## 2018-09-11 NOTE — Progress Notes (Signed)
Cardiology Office Note:    Date:  09/11/2018   ID:  Baylen Dea, DOB 08-17-1965, MRN [id number]  PCP:  Lanae Boast, [place]  Cardiologist:  Elouise Munroe, MD  Electrophysiologist:  None   Referring MD: Lanae Boast, [place]   Review chest pain and hyperlipidemia  History of Present Illness:    Jessica Lawson is a 53 y.o. female with a hx of  DM2 on oral therapy, hyperlipidemia, hypertension  who presents today for follow-up of chest discomfort.  Unfortunately her family is going through a very difficult time as her sister passed away from complications of a stroke in January 9.  She notes that while she has not had a significant episode of chest pain similar to her initial presentation, she has had chest discomfort associated with stress and emotional distress on occasion.  She denies significant shortness of breath, palpitations, PND, orthopnea, or leg swelling.  She feels that her discomfort in her chest is primarily related to the passing of her sister.  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  She has not been able to participate in her walking program as she would like.  We discussed potentially pursuing a CT coronary angiogram if her chest pain continued.  She had a normal Myoview stress test and a normal echocardiogram in July 2017.  She also had to cancel her sleep appointment and would like to reschedule this.  She denies fevers, chills, cough, nausea, vomiting, diarrhea, bleeding complications, dysuria, urinary frequency.  She does endorse less urine output but feels that this may be related to dehydration.  She continues on hydrochlorothiazide 12.5 mg daily.  Past Medical History:  Diagnosis Date  . Back pain   . Diabetes mellitus without complication ([place])   . High blood cholesterol   . Hypertension     . Kidney stone   . Normal cardiac stress test 02/2016   low risk  . Obesity   . Pain of cheek    and right side neck  . Tobacco use   . UTI (lower urinary tract infection)   . Wrist pain    left wrist    Past Surgical History:  Procedure Laterality Date  . CESAREAN SECTION     3 c-sections    Current Medications: Current Meds  Medication Sig  . ACCU-CHEK AVIVA PLUS test strip TEST BLOOD SUGAR UP TO FOUR TIMES DAILY AS DIRECTED  . ACCU-CHEK SOFTCLIX LANCETS lancets USE UP TO FOUR TIMES DAILY AS DIRECTED.  Marland Kitchen albuterol (PROVENTIL HFA;VENTOLIN HFA) 108 (90 Base) MCG/ACT inhaler Inhale 2 puffs into the lungs every 4 (four) hours as needed for wheezing or shortness of breath.  . blood glucose meter kit and supplies KIT Dispense based on patient and insurance preference. Use up to four times daily as directed. (FOR ICD-9 250.00, 250.01).  . cetirizine (ZYRTEC) 10 MG tablet Take 1 tablet (10 mg total) by mouth daily.  . fenofibrate (TRICOR) 48 MG tablet Take 1 tablet (48 mg total) by mouth daily.  . fluticasone (FLONASE) 50 MCG/ACT nasal spray Place 2 sprays into both nostrils daily.  . hydrochlorothiazide (HYDRODIURIL) 12.5 MG tablet Take 1 tablet (12.5 mg total) by mouth daily.  Marland Kitchen loratadine (CLARITIN) 10 MG tablet Take 1 tablet (10 mg total) by mouth daily.  . metFORMIN (GLUCOPHAGE) 500 MG tablet Take 1  tablet (500 mg total) by mouth 2 (two) times daily with a meal. (Patient taking differently: Take 500 mg by mouth 1 day or 1 dose. )  . Multiple Vitamins-Minerals (MULTIVITAMIN WITH MINERALS) tablet Take 1 tablet by mouth daily.  Marland Kitchen omega-3 acid ethyl esters (LOVAZA) 1 g capsule Take 1 capsule (1 g total) by mouth daily.  . [DISCONTINUED] methocarbamol (ROBAXIN) 500 MG tablet Take 1 tablet (500 mg total) by mouth every 8 (eight) hours as needed.  . [DISCONTINUED] sodium chloride (OCEAN) 0.65 % SOLN nasal spray Place 1 spray into both nostrils as needed for congestion.     Allergies:    Asa [aspirin]; Iohexol; Penicillins; Sulfa antibiotics; and Nystatin   Social History   Socioeconomic History  . Marital status: Single    Spouse name: Not on file  . Number of children: Not on file  . Years of education: Not on file  . Highest education level: Not on file  Occupational History  . Not on file  Social Needs  . Financial resource strain: Not on file  . Food insecurity:    Worry: Not on file    Inability: Not on file  . Transportation needs:    Medical: Not on file    Non-medical: Not on file  Tobacco Use  . Smoking status: Former Smoker    Packs/day: 1.00    Types: Cigarettes    Last attempt to quit: 07/26/2016    Years since quitting: 2.1  . Smokeless tobacco: Never Used  Substance and Sexual Activity  . Alcohol use: No  . Drug use: No  . Sexual activity: Not on file  Lifestyle  . Physical activity:    Days per week: Not on file    Minutes per session: Not on file  . Stress: Not on file  Relationships  . Social connections:    Talks on phone: Not on file    Gets together: Not on file    Attends religious service: Not on file    Active member of club or organization: Not on file    Attends meetings of clubs or organizations: Not on file    Relationship status: Not on file  Other Topics Concern  . Not on file  Social History Narrative  . Not on file     Family History: The patient's family history includes Diabetes in her mother, sister, sister, and another family member; Hyperlipidemia in an other family member; Hypertension in her sister and another family member; Lung disease in her father.  ROS:   Please see the history of present illness.    All other systems reviewed and are negative.  EKGs/Labs/Other Studies Reviewed:    The following studies were reviewed today:  EKG:  Not performed today  Recent Labs: 10/27/2017: TSH 0.793 05/02/2018: ALT 29 05/18/2018: BUN 14; Creatinine, Ser 0.89; Hemoglobin 14.3; Platelets 356; Potassium 3.2;  Sodium 139  Recent Lipid Panel    Component Value Date/Time   CHOL 355 (H) 05/02/2018 1136   TRIG 298 (H) 05/02/2018 1136   HDL 41 05/02/2018 1136   CHOLHDL 8.7 (H) 05/02/2018 1136   CHOLHDL 7.3 (H) 07/12/2017 0950   VLDL 50 (H) 04/06/2017 0902   LDLCALC 254 (H) 05/02/2018 1136   LDLCALC 183 (H) 07/12/2017 0950    Physical Exam:    VS:  BP 132/90   Pulse 74   Ht 4' 11" (1.499 m)   Wt 172 lb 12.8 oz (78.4 kg)   LMP 08/22/2011 Comment: patient  states LMP 20 years ago  BMI 34.90 kg/m     Wt Readings from Last 3 Encounters:  09/11/18 172 lb 12.8 oz (78.4 kg)  08/06/18 175 lb (79.4 kg)  08/01/18 173 lb (78.5 kg)     Constitutional: No acute distress Eyes: pupils equally round and reactive to light, sclera non-icteric, normal conjunctiva and lids ENMT: normal dentition, moist mucous membranes Cardiovascular: regular rhythm, normal rate, no murmurs. S1 and S2 normal. Radial pulses normal bilaterally. No jugular venous distention.  Respiratory: clear to auscultation bilaterally GI : normal bowel sounds, soft and nontender. No distention.   MSK: extremities warm, well perfused. No edema.  NEURO: grossly nonfocal exam, moves all extremities. PSYCH: alert and oriented x 3, normal mood and affect.   ASSESSMENT:    1. Mixed hyperlipidemia   2. Statin intolerance   3. Essential hypertension   4. Snoring   5. Atypical chest pain    PLAN:    Hyperlipidemia/statin intolerance- she continues to have significantly elevated LDL and triglycerides.  She has been tried on several statins and has experienced myalgias.  She has an active job at an active lifestyle and notes shoulder girdle weakness and myalgias with pravastatin, atorvastatin.  She also has had difficulty with coverage of Crestor due to her insurance.  She is currently on fenofibrate for approximately 1 year, however based on her most recent lipid panel this is likely under treating her hyperlipidemia.  I would like to refer  her to our colleagues in pharmacy for discussion of alternative therapies to statins or other options for low-dose statin therapy for treatment of hyperlipidemia.  Currently she does not have demonstrated coronary artery disease.  Essential hypertension- blood pressures currently well controlled continue current therapy.  Snoring- Epworth Sleepiness Scale score of 9, she will reschedule her sleep test when she has had time to grieve the death of her sister.  She reports no significant sleep disturbance.  Atypical chest pain- we have discussed at length the options for investigating atypical chest pain including a repeat Myoview stress test or a CT coronary angiogram.  I believe that since she has had a Myoview in the past that was unremarkable a CT coronary angiogram may be a reasonable next choice to define coronary anatomy if her chest discomfort continues.  I have offered this to the patient today in addition to the option of stress testing, however she feels that her discomfort is due to acute grief and she would like time to let this subside.  We participated in shared decision making and I believe this is reasonable.  I would like to see her back in 3 months for continued assessment of chest pain, management of hypertension, and reassessment of therapy for hyperlipidemia.  Medication Adjustments/Labs and Tests Ordered: Current medicines are reviewed at length with the patient today.  Concerns regarding medicines are outlined above.  Orders Placed This Encounter  Procedures  . AMB Referral to [facility]   No orders of the defined types were placed in this encounter.   Patient Instructions  Medication Instructions:  Your physician recommends that you continue on your current medications as directed. Please refer to the Current Medication list given to you today.  If you need a refill on your cardiac medications before your next appointment, please call your pharmacy.     Lab work: None ordered If you have labs (blood work) drawn today and your tests are completely normal, you will receive your results only by: Marland Kitchen  MyChart Message (if you have MyChart) OR . A paper copy in the mail If you have any lab test that is abnormal or we need to change your treatment, we will call you to review the results.  Testing/Procedures: None ordered  Follow-Up: At [facility], you and your health needs are our priority.  As part of our continuing mission to provide you with exceptional heart care, we have created designated Provider Care Teams.  These Care Teams include your primary Cardiologist (physician) and Advanced Practice Providers (APPs -  Physician Assistants and Nurse Practitioners) who all work together to provide you with the care you need, when you need it. You will need a follow up appointment in 3 months.   You may see Elouise Munroe, MD or one of the following Advanced Practice Providers on your designated Care Team:   Rosaria Ferries, PA-C . Jory Sims, DNP, ANP  Any Other Special Instructions Will Be Listed Below (If Applicable). You have been referred to the [facility] with our Pharmacist       Signed, Elouise Munroe, MD  09/11/2018 4:46 PM    Seth Ward

## 2018-09-11 NOTE — Patient Instructions (Signed)
Medication Instructions:  Your physician recommends that you continue on your current medications as directed. Please refer to the Current Medication list given to you today.  If you need a refill on your cardiac medications before your next appointment, please call your pharmacy.   Lab work: None ordered If you have labs (blood work) drawn today and your tests are completely normal, you will receive your results only by: Marland Kitchen MyChart Message (if you have MyChart) OR . A paper copy in the mail If you have any lab test that is abnormal or we need to change your treatment, we will call you to review the results.  Testing/Procedures: None ordered  Follow-Up: At [facility], you and your health needs are our priority.  As part of our continuing mission to provide you with exceptional heart care, we have created designated Provider Care Teams.  These Care Teams include your primary Cardiologist (physician) and Advanced Practice Providers (APPs -  Physician Assistants and Nurse Practitioners) who all work together to provide you with the care you need, when you need it. You will need a follow up appointment in 3 months.   You may see Elouise Munroe, MD or one of the following Advanced Practice Providers on your designated Care Team:   Rosaria Ferries, PA-C . Jory Sims, DNP, ANP  Any Other Special Instructions Will Be Listed Below (If Applicable). You have been referred to the [facility] with our Pharmacist

## 2018-09-13 ENCOUNTER — Telehealth: Payer: Self-pay | Admitting: *Deleted

## 2018-09-13 NOTE — Telephone Encounter (Signed)
Patient notified of sleep study appointment. 

## 2018-09-13 NOTE — Telephone Encounter (Signed)
-----   Message from Lamar Laundry, RN sent at 09/11/2018  4:43 PM EST ----- Regarding: pt would like to reschedule her sleep study Pt was seen today by Dr.Acharya. she had to cancel her sleep study due to a family emergency. She would like to reschedule.

## 2018-09-18 ENCOUNTER — Ambulatory Visit (INDEPENDENT_AMBULATORY_CARE_PROVIDER_SITE_OTHER): Payer: Medicaid Other | Admitting: Pharmacist Clinician (PhC)/ Clinical Pharmacy Specialist

## 2018-09-18 DIAGNOSIS — E782 Mixed hyperlipidemia: Secondary | ICD-10-CM | POA: Diagnosis not present

## 2018-09-18 MED ORDER — ROSUVASTATIN CALCIUM 10 MG PO TABS: ORAL_TABLET | ORAL | 3 refills | Status: DC

## 2018-09-18 NOTE — Progress Notes (Signed)
09/18/2018 Jessica Lawson Mar 03, 1966 [id number]   HPI:  Jessica Lawson is a 53 y.o. female patient of Jessica Lawson, who presents today for a lipid clinic evaluation.  In addition to having familial hyperlipidemia, her medical history is significant for hypertension, pre-diabetes (A1c 5.9) and obesity.  Her life has been stressful as her sister recently died from complications of a stroke and she caring for her 5 year old granddaughter.    Patient has previously tried both atorvastatin and pravastatin, and was unable to tolerate them due to myalgias.  She was once prescribed rosuvastatin, but at that time it was not covered by her insurance.  She has recently purchased a bottle of red yeast rice, but was not sure if she could use it while taking fenofibrate.    Current Medications: none  Cholesterol Goals:  LDL < 100, but ideally will start with a 50% reduction   Intolerant/previously tried:  Pravastatin 20-80 mg muscle aches, weight gain  Atorvastatin 40 mg muscle aches  Family history:   Sister died from stroke in 08/25/22 at age 54  Father died in house fire age 49  Mother died from double pneumonia in her 7's, also DM  1 other sister with high cholesterol, DM  3 adult children, have not been checked for hyperlipidemia to her knowledge  Diet: mix ofhome/eat out; fried foods in restaurants; rare fried at home; tries to get veggies in, looks for low sodium canned veggies.  Eats "white" foods regularly.    Exercise:  Previously in walking program, unable to lately due to family stressors  Labs: 03/2018:  TC 355, TG 298, HDL 41, LDL 254   Current Outpatient Medications  Medication Sig Dispense Refill  . ACCU-CHEK AVIVA PLUS test strip TEST BLOOD SUGAR UP TO FOUR TIMES DAILY AS DIRECTED 100 each 0  . ACCU-CHEK SOFTCLIX LANCETS lancets USE UP TO FOUR TIMES DAILY AS DIRECTED. 100 each 0  . albuterol (PROVENTIL HFA;VENTOLIN HFA) 108 (90 Base) MCG/ACT inhaler Inhale 2 puffs into the lungs every  4 (four) hours as needed for wheezing or shortness of breath. 1 Inhaler 2  . blood glucose meter kit and supplies KIT Dispense based on patient and insurance preference. Use up to four times daily as directed. (FOR ICD-9 250.00, 250.01). 1 each 0  . cetirizine (ZYRTEC) 10 MG tablet Take 1 tablet (10 mg total) by mouth daily. 30 tablet 11  . fenofibrate (TRICOR) 48 MG tablet Take 1 tablet (48 mg total) by mouth daily. 30 tablet 5  . fluticasone (FLONASE) 50 MCG/ACT nasal spray Place 2 sprays into both nostrils daily. 16 g 6  . hydrochlorothiazide (HYDRODIURIL) 12.5 MG tablet Take 1 tablet (12.5 mg total) by mouth daily. 90 tablet 1  . loratadine (CLARITIN) 10 MG tablet Take 1 tablet (10 mg total) by mouth daily. 30 tablet 11  . metFORMIN (GLUCOPHAGE) 500 MG tablet Take 1 tablet (500 mg total) by mouth 2 (two) times daily with a meal. (Patient taking differently: Take 500 mg by mouth 1 day or 1 dose. ) 180 tablet 1  . Multiple Vitamins-Minerals (MULTIVITAMIN WITH MINERALS) tablet Take 1 tablet by mouth daily.    Marland Kitchen omega-3 acid ethyl esters (LOVAZA) 1 g capsule Take 1 capsule (1 g total) by mouth daily. 90 capsule 1  . rosuvastatin (CRESTOR) 10 MG tablet Take 1 tablet up to 3 times per week as tolerated 30 tablet 3   No current facility-administered medications for this visit.     Allergies  Allergen Reactions  . Asa [Aspirin] Anaphylaxis, Hives and Swelling  . Iohexol Other (See Comments)     Desc: pt complains of difficulty swallowing and thickened tongue/ throat closes up   . Penicillins Hives, Swelling and Other (See Comments)    Thrush   . Sulfa Antibiotics Anaphylaxis, Hives, Swelling and Other (See Comments)  . Nystatin Other (See Comments)    thrush    Past Medical History:  Diagnosis Date  . Back pain   . Diabetes mellitus without complication ([place])   . High blood cholesterol   . Hypertension   . Kidney stone   . Normal cardiac stress test 02/2016   low risk  . Obesity   .  Pain of cheek    and right side neck  . Tobacco use   . UTI (lower urinary tract infection)   . Wrist pain    left wrist    Blood pressure 138/80, pulse 87, resp. rate 15, weight 175 lb 3.2 oz (79.5 kg), last menstrual period 08/22/2011.   Hyperlipidemia Patient with familial hyperlipidemia and hypertriglyceridemia.   She will continue with the fenofibrate for now, and was encouraged to take it with meals.  Patient is hesitant about doing injections, stating she doesn't even like to check her blood sugars.  For now will start with rosuvastatin 10 mg twice weekly (M/F).  She realizes that this will not get her to goal and that she will most likely need the PCSK9 inhibitor.  She is also interested in bempedoid acid as an alternative.  Will have her take the rosuvastatin for this month and then contact her once the FDA approves bempedoic acid to determine if it would be a good fit for her.    Also had a long discussion about diet, avoiding fried and white foods.  She was also encouraged to have her children get their cholesterol levels checked (if possible), as they would benefit from early intervention.     Tommy Medal PharmD CPP [facility] [address] [place] [address] [phone]

## 2018-09-18 NOTE — Patient Instructions (Signed)
Start rosuvastatin 10 mg twice weekly.  We will contact you later this month or in March regarding the new medication    Cholesterol  Cholesterol is a fat. Your body needs a small amount of cholesterol. Cholesterol (plaque) may build up in your blood vessels (arteries). That makes you more likely to have a heart attack or stroke. You cannot feel your cholesterol level. Having a blood test is the only way to find out if your level is high. Keep your test results. Work with your doctor to keep your cholesterol at a good level. What do the results mean?  Total cholesterol is how much cholesterol is in your blood.  LDL is bad cholesterol. This is the type that can build up. Try to have low LDL.  HDL is good cholesterol. It cleans your blood vessels and carries LDL away. Try to have high HDL.  Triglycerides are fat that the body can store or burn for energy. What are good levels of cholesterol?  Total cholesterol below 200.  LDL below 100 is good for people who have health risks. LDL below 70 is good for people who have very high risks.  HDL above 40 is good. It is best to have HDL of 60 or higher.  Triglycerides below 150. How can I lower my cholesterol? Diet Follow your diet program as told by your doctor.  Choose fish, white meat chicken, or [place] that is roasted or baked. Try not to eat red meat, fried foods, sausage, or lunch meats.  Eat lots of fresh fruits and vegetables.  Choose whole grains, beans, pasta, potatoes, and cereals.  Choose olive oil, corn oil, or canola oil. Only use small amounts.  Try not to eat butter, mayonnaise, shortening, or palm kernel oils.  Try not to eat foods with trans fats.  Choose low-fat or nonfat dairy foods. ? Drink skim or nonfat milk. ? Eat low-fat or nonfat yogurt and cheeses. ? Try not to drink whole milk or cream. ? Try not to eat ice cream, egg yolks, or full-fat cheeses.  Healthy desserts include angel food cake, ginger  snaps, animal crackers, hard candy, popsicles, and low-fat or nonfat frozen yogurt. Try not to eat pastries, cakes, pies, and cookies.  Exercise Follow your exercise program as told by your doctor.  Be more active. Try gardening, walking, and taking the stairs.  Ask your doctor about ways that you can be more active. Medicine  Take over-the-counter and prescription medicines only as told by your doctor. This information is not intended to replace advice given to you by your health care provider. Make sure you discuss any questions you have with your health care provider. Document Released: 10/28/2008 Document Revised: 03/02/2016 Document Reviewed: 02/11/2016 Elsevier Interactive Patient Education  2019 [employer].

## 2018-09-18 NOTE — Assessment & Plan Note (Signed)
Patient with familial hyperlipidemia and hypertriglyceridemia.   She will continue with the fenofibrate for now, and was encouraged to take it with meals.  Patient is hesitant about doing injections, stating she doesn't even like to check her blood sugars.  For now will start with rosuvastatin 10 mg twice weekly (M/F).  She realizes that this will not get her to goal and that she will most likely need the PCSK9 inhibitor.  She is also interested in bempedoid acid as an alternative.  Will have her take the rosuvastatin for this month and then contact her once the FDA approves bempedoic acid to determine if it would be a good fit for her.    Also had a long discussion about diet, avoiding fried and white foods.  She was also encouraged to have her children get their cholesterol levels checked (if possible), as they would benefit from early intervention.

## 2018-09-25 ENCOUNTER — Other Ambulatory Visit: Payer: Self-pay | Admitting: Family Medicine

## 2018-09-25 DIAGNOSIS — Z1231 Encounter for screening mammogram for malignant neoplasm of breast: Secondary | ICD-10-CM

## 2018-10-02 ENCOUNTER — Other Ambulatory Visit: Payer: Self-pay | Admitting: Family Medicine

## 2018-10-02 DIAGNOSIS — I10 Essential (primary) hypertension: Secondary | ICD-10-CM

## 2018-10-03 ENCOUNTER — Ambulatory Visit (INDEPENDENT_AMBULATORY_CARE_PROVIDER_SITE_OTHER): Payer: Medicaid Other | Admitting: Family Medicine

## 2018-10-03 ENCOUNTER — Encounter: Payer: Self-pay | Admitting: Family Medicine

## 2018-10-03 VITALS — BP 135/80 | HR 74 | Temp 97.7°F | Resp 14 | Ht 59.0 in | Wt 172.0 lb

## 2018-10-03 DIAGNOSIS — R0602 Shortness of breath: Secondary | ICD-10-CM

## 2018-10-03 DIAGNOSIS — J40 Bronchitis, not specified as acute or chronic: Secondary | ICD-10-CM | POA: Diagnosis not present

## 2018-10-03 MED ORDER — IPRATROPIUM BROMIDE 0.02 % IN SOLN
0.5000 mg | Freq: Once | RESPIRATORY_TRACT | Status: AC
  Administered 2018-10-03: 0.5 mg via RESPIRATORY_TRACT

## 2018-10-03 MED ORDER — ALBUTEROL SULFATE (2.5 MG/3ML) 0.083% IN NEBU
2.5000 mg | INHALATION_SOLUTION | Freq: Once | RESPIRATORY_TRACT | Status: AC
  Administered 2018-10-03: 2.5 mg via RESPIRATORY_TRACT

## 2018-10-03 MED ORDER — ALBUTEROL SULFATE HFA 108 (90 BASE) MCG/ACT IN AERS: 2.0000 | INHALATION_SPRAY | RESPIRATORY_TRACT | 2 refills | Status: DC | PRN

## 2018-10-03 MED ORDER — AZITHROMYCIN 250 MG PO TABS: ORAL_TABLET | ORAL | 0 refills | Status: DC

## 2018-10-03 NOTE — Patient Instructions (Signed)
Acute Bronchitis, Adult Acute bronchitis is when air tubes (bronchi) in the lungs suddenly get swollen. The condition can make it hard to breathe. It can also cause these symptoms:  A cough.  Coughing up clear, yellow, or green mucus.  Wheezing.  Chest congestion.  Shortness of breath.  A fever.  Body aches.  Chills.  A sore throat. Follow these instructions at home:  Medicines  Take over-the-counter and prescription medicines only as told by your doctor.  If you were prescribed an antibiotic medicine, take it as told by your doctor. Do not stop taking the antibiotic even if you start to feel better. General instructions  Rest.  Drink enough fluids to keep your pee (urine) pale yellow.  Avoid smoking and secondhand smoke. If you smoke and you need help quitting, ask your doctor. Quitting will help your lungs heal faster.  Use an inhaler, cool mist vaporizer, or humidifier as told by your doctor.  Keep all follow-up visits as told by your doctor. This is important. How is this prevented? To lower your risk of getting this condition again:  Wash your hands often with soap and water. If you cannot use soap and water, use hand sanitizer.  Avoid contact with people who have cold symptoms.  Try not to touch your hands to your mouth, nose, or eyes.  Make sure to get the flu shot every year. Contact a doctor if:  Your symptoms do not get better in 2 weeks. Get help right away if:  You cough up blood.  You have chest pain.  You have very bad shortness of breath.  You become dehydrated.  You faint (pass out) or keep feeling like you are going to pass out.  You keep throwing up (vomiting).  You have a very bad headache.  Your fever or chills gets worse. This information is not intended to replace advice given to you by your health care provider. Make sure you discuss any questions you have with your health care provider. Document Released: 01/18/2008 Document  Revised: 03/15/2017 Document Reviewed: 01/20/2016 Elsevier Interactive Patient Education  2019 [employer] Inc[employer]  

## 2018-10-03 NOTE — Progress Notes (Signed)
[facility]   Progress Note: Sick Visit Provider: Lanae Boast, FNP  SUBJECTIVE:   Jessica Lawson is a 53 y.o. female who  has a past medical history of Back pain, Diabetes mellitus without complication ([place]), High blood cholesterol, Hypertension, Kidney stone, Normal cardiac stress test (02/2016), Obesity, Pain of cheek, Tobacco use, UTI (lower urinary tract infection), and Wrist pain.. Patient presents today for Cough (green sputumn x 1 week ) and Medication Problem (questions about cholesterol medication )  Cough  This is a new problem. The current episode started in the past 7 days. The problem has been unchanged. The problem occurs every few minutes. The cough is productive of sputum. Associated symptoms include shortness of breath and wheezing. Pertinent negatives include no heartburn, nasal congestion or rhinorrhea. The symptoms are aggravated by cold air. She has tried nothing for the symptoms. There is no history of asthma, COPD or environmental allergies.    Review of Systems  HENT: Negative for rhinorrhea.   Respiratory: Positive for cough, shortness of breath and wheezing.   Gastrointestinal: Negative for heartburn.  Endo/Heme/Allergies: Negative for environmental allergies.  All other systems reviewed and are negative.    OBJECTIVE: BP 135/80 (BP Location: Left Arm, Patient Position: Sitting, Cuff Size: Large)   Pulse 74   Temp 97.7 F (36.5 C) (Oral)   Resp 14   Ht 4\' 11"  (1.499 m)   Wt 172 lb (78 kg)   LMP 08/22/2011 Comment: patient states LMP 20 years ago  SpO2 100%   BMI 34.74 kg/m   Wt Readings from Last 3 Encounters:  10/03/18 172 lb (78 kg)  09/18/18 175 lb 3.2 oz (79.5 kg)  09/11/18 172 lb 12.8 oz (78.4 kg)     Physical Exam Vitals signs and nursing note reviewed.  Constitutional:      General: She is not in acute distress.    Appearance: She is well-developed.  HENT:     Head: Normocephalic and  atraumatic.  Eyes:     Conjunctiva/sclera: Conjunctivae normal.     Pupils: Pupils are equal, round, and reactive to light.  Neck:     Musculoskeletal: Normal range of motion.  Cardiovascular:     Rate and Rhythm: Normal rate and regular rhythm.     Heart sounds: Normal heart sounds.  Pulmonary:     Effort: Pulmonary effort is normal. No respiratory distress.     Breath sounds: Wheezing (mild throughout all lung fields. ) present.  Abdominal:     General: Bowel sounds are normal. There is no distension.     Palpations: Abdomen is soft.  Musculoskeletal: Normal range of motion.  Skin:    General: Skin is warm and dry.  Neurological:     Mental Status: She is alert and oriented to person, place, and time.  Psychiatric:        Mood and Affect: Mood normal.        Behavior: Behavior normal.        Thought Content: Thought content normal.        Judgment: Judgment normal.     ASSESSMENT/PLAN:  1. Bronchitis - albuterol (PROVENTIL) (2.5 MG/3ML) 0.083% nebulizer solution 2.5 mg - ipratropium (ATROVENT) nebulizer solution 0.5 mg - azithromycin (ZITHROMAX) 250 MG tablet; Take as directed on pack  Dispense: 6 tablet; Refill: 0  2. Shortness of breath - albuterol (PROVENTIL HFA;VENTOLIN HFA) 108 (90 Base) MCG/ACT inhaler; Inhale 2 puffs into the lungs every 4 (four) hours  as needed for wheezing or shortness of breath.  Dispense: 1 Inhaler; Refill: 2        The patient was given clear instructions to go to ER or return to medical center if symptoms do not improve, worsen or new problems develop. The patient verbalized understanding and agreed with plan of care.   [name], FNP-BC [facility] [address] [place], [place] [address] [phone]     This note has been created with Dragon speech recognition software and smart phrase technology. Any transcriptional errors are unintentional.

## 2018-10-12 ENCOUNTER — Telehealth: Payer: Self-pay

## 2018-10-12 MED ORDER — GLUCOSE BLOOD VI STRP: ORAL_STRIP | 0 refills | Status: DC

## 2018-10-12 NOTE — Telephone Encounter (Signed)
Refills for glucose test strips sent into pharmacy. Thanks!

## 2018-10-26 ENCOUNTER — Ambulatory Visit
Admission: RE | Admit: 2018-10-26 | Discharge: 2018-10-26 | Disposition: A | Discharge status: Home / Self Care | Payer: 59 | Source: Ambulatory Visit | Attending: Family Medicine | Admitting: Family Medicine

## 2018-10-26 ENCOUNTER — Other Ambulatory Visit: Payer: Self-pay

## 2018-10-26 DIAGNOSIS — Z1231 Encounter for screening mammogram for malignant neoplasm of breast: Secondary | ICD-10-CM

## 2018-10-28 ENCOUNTER — Ambulatory Visit (HOSPITAL_BASED_OUTPATIENT_CLINIC_OR_DEPARTMENT_OTHER): Payer: 59 | Attending: Cardiovascular Disease

## 2018-11-01 ENCOUNTER — Ambulatory Visit (INDEPENDENT_AMBULATORY_CARE_PROVIDER_SITE_OTHER): Payer: Medicaid Other | Admitting: Family Medicine

## 2018-11-01 ENCOUNTER — Encounter: Payer: Self-pay | Admitting: Family Medicine

## 2018-11-01 ENCOUNTER — Other Ambulatory Visit: Payer: Self-pay

## 2018-11-01 VITALS — BP 143/68 | HR 98 | Temp 97.9°F | Resp 16 | Ht 59.0 in | Wt 174.2 lb

## 2018-11-01 DIAGNOSIS — Z23 Encounter for immunization: Secondary | ICD-10-CM

## 2018-11-01 DIAGNOSIS — R7303 Prediabetes: Secondary | ICD-10-CM | POA: Diagnosis not present

## 2018-11-01 DIAGNOSIS — I10 Essential (primary) hypertension: Secondary | ICD-10-CM

## 2018-11-01 DIAGNOSIS — H9202 Otalgia, left ear: Secondary | ICD-10-CM | POA: Diagnosis not present

## 2018-11-01 LAB — POCT GLYCOSYLATED HEMOGLOBIN (HGB A1C): Hemoglobin A1C: 6.1 % — AB (ref 4.0–5.6)

## 2018-11-01 LAB — GLUCOSE, POCT (MANUAL RESULT ENTRY): POC Glucose: 90 mg/dl (ref 70–99)

## 2018-11-01 IMAGING — CT CT RENAL STONE PROTOCOL
2 of 4 series · 16 of 46 positions shown, 18 images · non-contrast
Comparison: Prior CT from 02/22/2014

CLINICAL DATA: Initial evaluation for acute bilateral flank pain
for 2 days. History of renal stones.

EXAM:
CT ABDOMEN AND PELVIS WITHOUT CONTRAST
TECHNIQUE: Multidetector CT imaging of the abdomen and pelvis was performed
following the standard protocol without IV contrast.

[Series 2: renal stone 5mm · axial · 0.77mm/px · z∈[+658,+1048]mm · 13 of 86 slices shown, 15 images]
[im 4/86  soft-tissue]
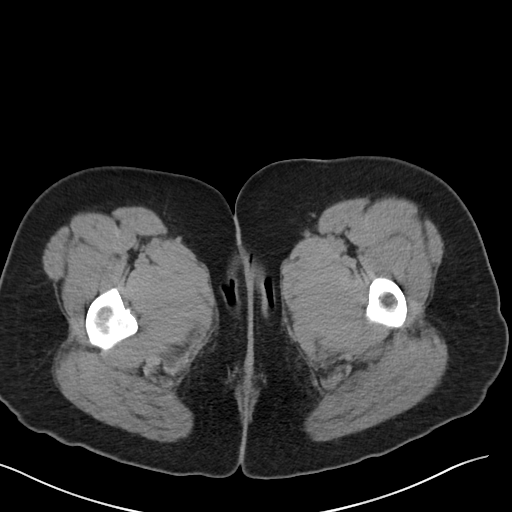
[im 4/86  bone]
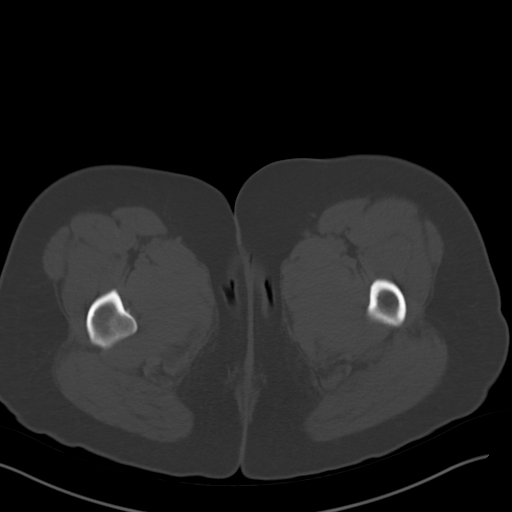
[im 11/86  soft-tissue]
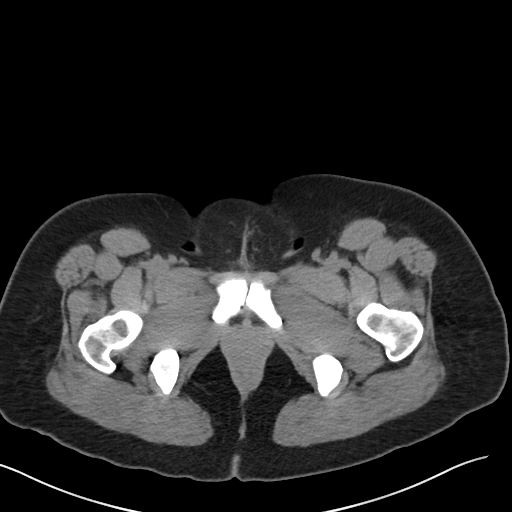
[im 18/86  soft-tissue]
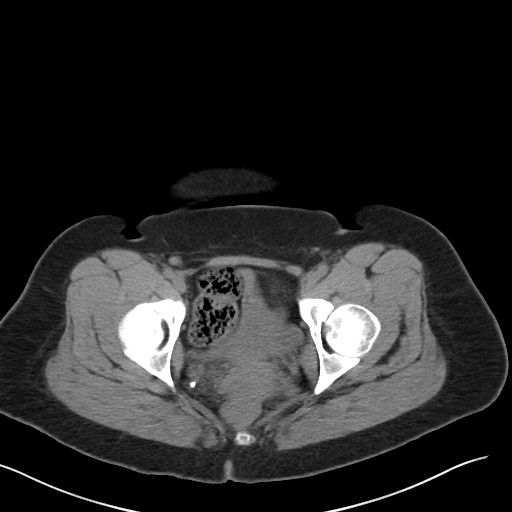
[im 24/86  soft-tissue]
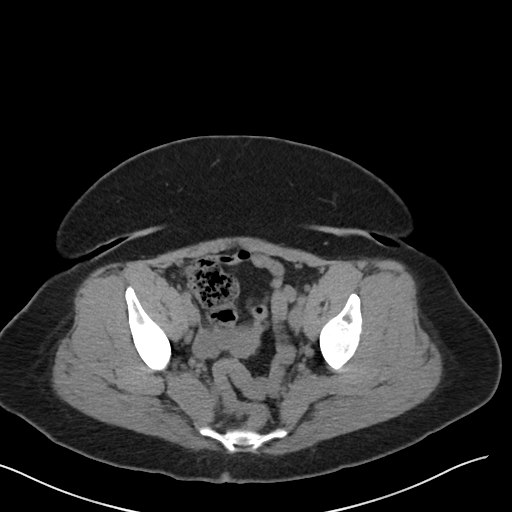
[im 31/86  soft-tissue]
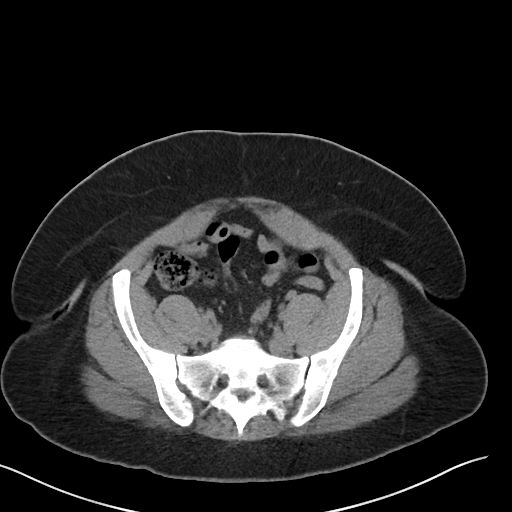
[im 38/86  soft-tissue]
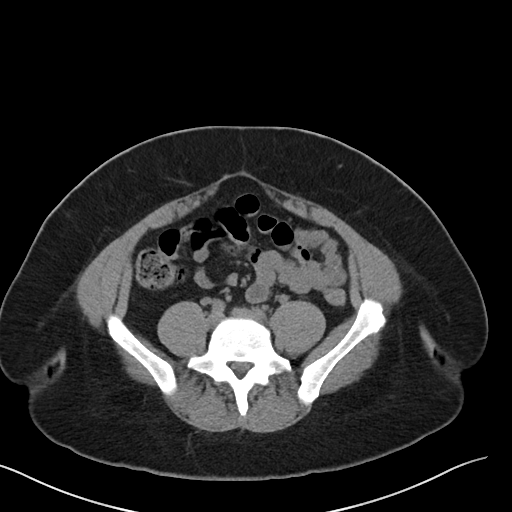
[im 45/86  soft-tissue]
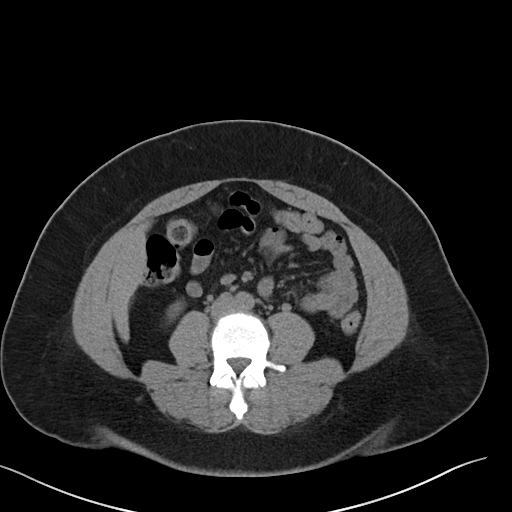
[im 48/86  soft-tissue]
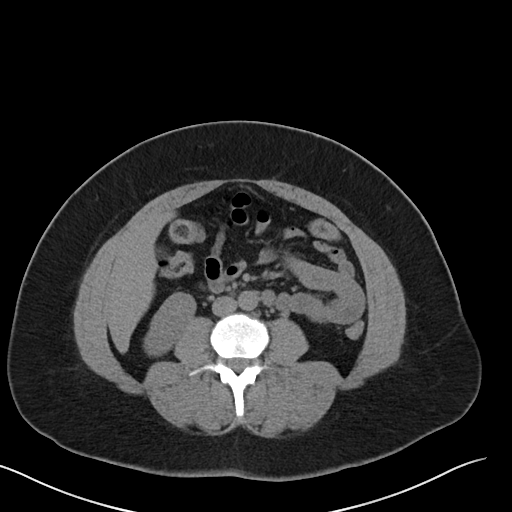
[im 55/86  soft-tissue]
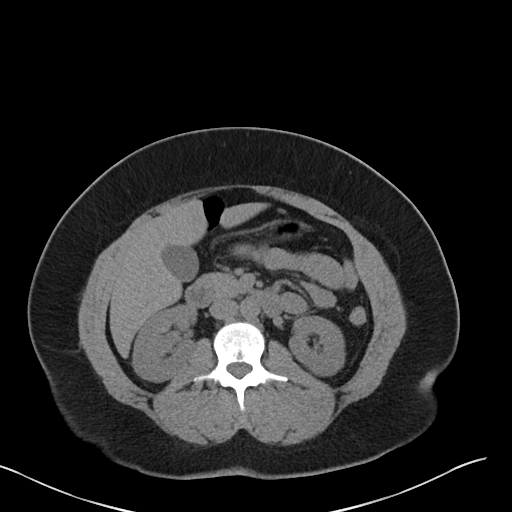
[im 55/86  bone]
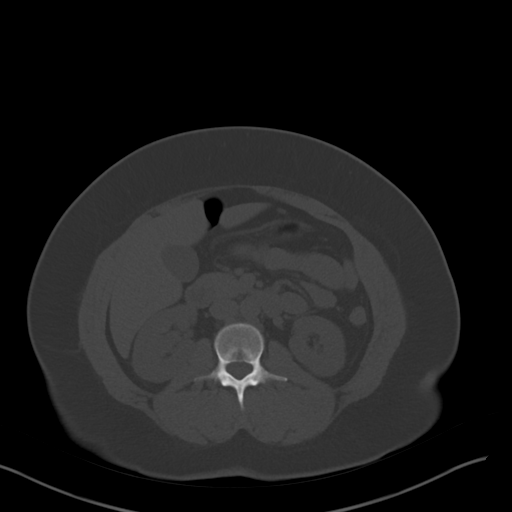
[im 62/86  soft-tissue]
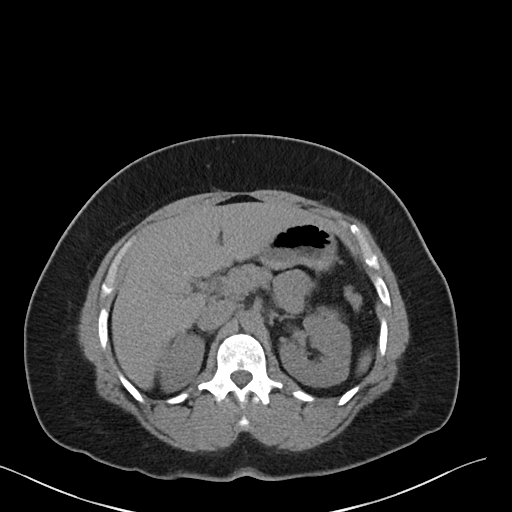
[im 69/86  soft-tissue]
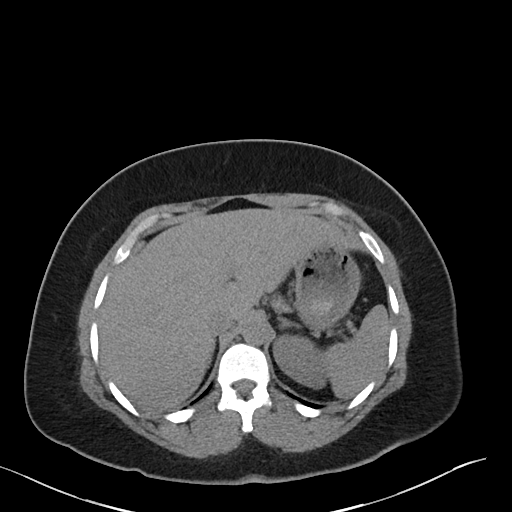
[im 75/86  soft-tissue]
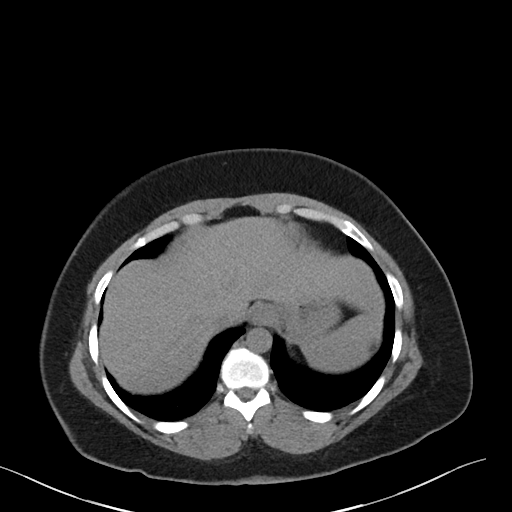
[im 82/86  soft-tissue]
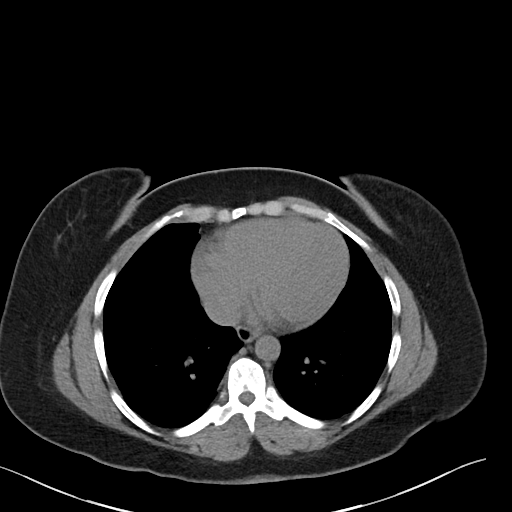

[Series 4: renal stone 3.0 cor · coronal · 0.59mm/px · 3 of 98 slices shown]
[im 33/98  soft-tissue]
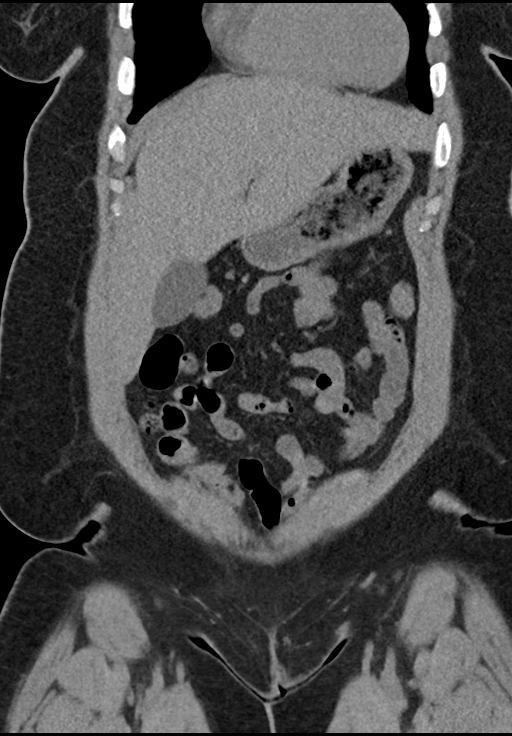
[im 44/98  soft-tissue]
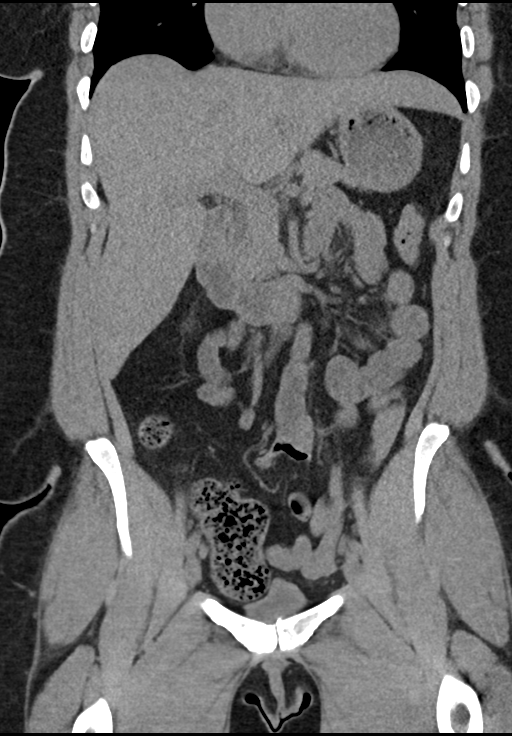
[im 54/98  soft-tissue]
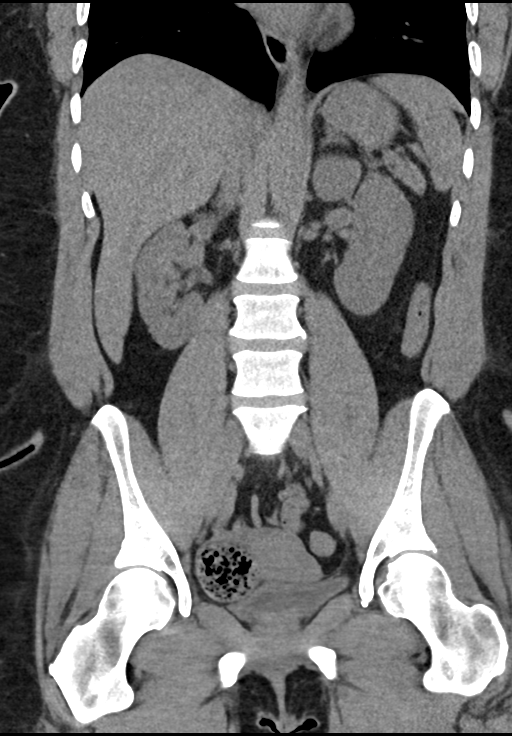

[16 of 46 positions shown; findings below may reference images not displayed]

FINDINGS: Lower chest: Hazy atelectatic changes seen dependently within the
visualized lung bases. Visualized lungs are otherwise clear.

Hepatobiliary: The liver demonstrates a normal unenhanced
appearance. Gallbladder within normal limits. No biliary dilatation.

Pancreas: Pancreas within normal limits.

Spleen: Spleen within normal limits.

Adrenals/Urinary Tract: Adrenal glands are normal. Kidneys equal in
size without evidence for nephrolithiasis or hydronephrosis. No
radiopaque calculi seen along the course of either renal collecting
system. There is no hydroureter. Bladder decompressed without acute
abnormality. No layering stones seen within the bladder lumen.

Stomach/Bowel: Stomach within normal limits. No evidence for bowel
obstruction. No abnormal wall thickening or inflammatory fat
stranding seen about the bowels. Appendix within normal limits.

Vascular/Lymphatic: Intra-abdominal aorta of normal caliber. No
adenopathy.

Reproductive: Uterus and ovaries within normal limits.

Other: No free intraperitoneal air.  No free fluid.

Musculoskeletal: No acute osseous abnormality. No worrisome lytic or
blastic osseous lesions.
IMPRESSION: 1. No CT evidence for nephrolithiasis or obstructive uropathy.
2. No other acute intra-abdominal or pelvic process identified.

## 2018-11-01 NOTE — Patient Instructions (Signed)
DASH Eating Plan DASH stands for "Dietary Approaches to Stop Hypertension." The DASH eating plan is a healthy eating plan that has been shown to reduce high blood pressure (hypertension). It may also reduce your risk for type 2 diabetes, heart disease, and stroke. The DASH eating plan may also help with weight loss. What are tips for following this plan?  General guidelines  Avoid eating more than 2,300 mg (milligrams) of salt (sodium) a day. If you have hypertension, you may need to reduce your sodium intake to 1,500 mg a day.  Limit alcohol intake to no more than 1 drink a day for nonpregnant women and 2 drinks a day for men. One drink equals 12 oz of beer, 5 oz of wine, or 1 oz of hard liquor.  Work with your health care provider to maintain a healthy body weight or to lose weight. Ask what an ideal weight is for you.  Get at least 30 minutes of exercise that causes your heart to beat faster (aerobic exercise) most days of the week. Activities may include walking, swimming, or biking.  Work with your health care provider or diet and nutrition specialist (dietitian) to adjust your eating plan to your individual calorie needs. Reading food labels   Check food labels for the amount of sodium per serving. Choose foods with less than 5 percent of the Daily Value of sodium. Generally, foods with less than 300 mg of sodium per serving fit into this eating plan.  To find whole grains, look for the word "whole" as the first word in the ingredient list. Shopping  Buy products labeled as "low-sodium" or "no salt added."  Buy fresh foods. Avoid canned foods and premade or frozen meals. Cooking  Avoid adding salt when cooking. Use salt-free seasonings or herbs instead of table salt or sea salt. Check with your health care provider or pharmacist before using salt substitutes.  Do not fry foods. Cook foods using healthy methods such as baking, boiling, grilling, and broiling instead.  Cook with  heart-healthy oils, such as olive, canola, soybean, or sunflower oil. Meal planning  Eat a balanced diet that includes: ? 5 or more servings of fruits and vegetables each day. At each meal, try to fill half of your plate with fruits and vegetables. ? Up to 6-8 servings of whole grains each day. ? Less than 6 oz of lean meat, poultry, or fish each day. A 3-oz serving of meat is about the same size as a deck of cards. One egg equals 1 oz. ? 2 servings of low-fat dairy each day. ? A serving of nuts, seeds, or beans 5 times each week. ? Heart-healthy fats. Healthy fats called Omega-3 fatty acids are found in foods such as flaxseeds and coldwater fish, like sardines, salmon, and mackerel.  Limit how much you eat of the following: ? Canned or prepackaged foods. ? Food that is high in trans fat, such as fried foods. ? Food that is high in saturated fat, such as fatty meat. ? Sweets, desserts, sugary drinks, and other foods with added sugar. ? Full-fat dairy products.  Do not salt foods before eating.  Try to eat at least 2 vegetarian meals each week.  Eat more home-cooked food and less restaurant, buffet, and fast food.  When eating at a restaurant, ask that your food be prepared with less salt or no salt, if possible. What foods are recommended? The items listed may not be a complete list. Talk with your dietitian about   what dietary choices are best for you. Grains Whole-grain or whole-wheat bread. Whole-grain or whole-wheat pasta. Brown rice. Oatmeal. Quinoa. Bulgur. Whole-grain and low-sodium cereals. Pita bread. Low-fat, low-sodium crackers. Whole-wheat flour tortillas. Vegetables Fresh or frozen vegetables (raw, steamed, roasted, or grilled). Low-sodium or reduced-sodium tomato and vegetable juice. Low-sodium or reduced-sodium tomato sauce and tomato paste. Low-sodium or reduced-sodium canned vegetables. Fruits All fresh, dried, or frozen fruit. Canned fruit in natural juice (without  added sugar). Meat and other protein foods Skinless chicken or turkey. Ground chicken or turkey. Pork with fat trimmed off. Fish and seafood. Egg whites. Dried beans, peas, or lentils. Unsalted nuts, nut butters, and seeds. Unsalted canned beans. Lean cuts of beef with fat trimmed off. Low-sodium, lean deli meat. Dairy Low-fat (1%) or fat-free (skim) milk. Fat-free, low-fat, or reduced-fat cheeses. Nonfat, low-sodium ricotta or cottage cheese. Low-fat or nonfat yogurt. Low-fat, low-sodium cheese. Fats and oils Soft margarine without trans fats. Vegetable oil. Low-fat, reduced-fat, or light mayonnaise and salad dressings (reduced-sodium). Canola, safflower, olive, soybean, and sunflower oils. Avocado. Seasoning and other foods Herbs. Spices. Seasoning mixes without salt. Unsalted popcorn and pretzels. Fat-free sweets. What foods are not recommended? The items listed may not be a complete list. Talk with your dietitian about what dietary choices are best for you. Grains Baked goods made with fat, such as croissants, muffins, or some breads. Dry pasta or rice meal packs. Vegetables Creamed or fried vegetables. Vegetables in a cheese sauce. Regular canned vegetables (not low-sodium or reduced-sodium). Regular canned tomato sauce and paste (not low-sodium or reduced-sodium). Regular tomato and vegetable juice (not low-sodium or reduced-sodium). Pickles. Olives. Fruits Canned fruit in a light or heavy syrup. Fried fruit. Fruit in cream or butter sauce. Meat and other protein foods Fatty cuts of meat. Ribs. Fried meat. Bacon. Sausage. Bologna and other processed lunch meats. Salami. Fatback. Hotdogs. Bratwurst. Salted nuts and seeds. Canned beans with added salt. Canned or smoked fish. Whole eggs or egg yolks. Chicken or turkey with skin. Dairy Whole or 2% milk, cream, and half-and-half. Whole or full-fat cream cheese. Whole-fat or sweetened yogurt. Full-fat cheese. Nondairy creamers. Whipped toppings.  Processed cheese and cheese spreads. Fats and oils Butter. Stick margarine. Lard. Shortening. Ghee. Bacon fat. Tropical oils, such as coconut, palm kernel, or palm oil. Seasoning and other foods Salted popcorn and pretzels. Onion salt, garlic salt, seasoned salt, table salt, and sea salt. Worcestershire sauce. Tartar sauce. Barbecue sauce. Teriyaki sauce. Soy sauce, including reduced-sodium. Steak sauce. Canned and packaged gravies. Fish sauce. Oyster sauce. Cocktail sauce. Horseradish that you find on the shelf. Ketchup. Mustard. Meat flavorings and tenderizers. Bouillon cubes. Hot sauce and Tabasco sauce. Premade or packaged marinades. Premade or packaged taco seasonings. Relishes. Regular salad dressings. Where to find more information:  National Heart, Lung, and Blood Institute: www.nhlbi.nih.gov  American Heart Association: www.heart.org Summary  The DASH eating plan is a healthy eating plan that has been shown to reduce high blood pressure (hypertension). It may also reduce your risk for type 2 diabetes, heart disease, and stroke.  With the DASH eating plan, you should limit salt (sodium) intake to 2,300 mg a day. If you have hypertension, you may need to reduce your sodium intake to 1,500 mg a day.  When on the DASH eating plan, aim to eat more fresh fruits and vegetables, whole grains, lean proteins, low-fat dairy, and heart-healthy fats.  Work with your health care provider or diet and nutrition specialist (dietitian) to adjust your eating plan to your   individual calorie needs. This information is not intended to replace advice given to you by your health care provider. Make sure you discuss any questions you have with your health care provider. Document Released: 07/21/2011 Document Revised: 07/25/2016 Document Reviewed: 07/25/2016 Elsevier Interactive Patient Education  2019 [place] Maintenance, Female Adopting a healthy lifestyle and getting preventive care can go a  long way to promote health and wellness. Talk with your health care provider about what schedule of regular examinations is right for you. This is a good chance for you to check in with your provider about disease prevention and staying healthy. In between checkups, there are plenty of things you can do on your own. Experts have done a lot of research about which lifestyle changes and preventive measures are most likely to keep you healthy. Ask your health care provider for more information. Weight and diet Eat a healthy diet  Be sure to include plenty of vegetables, fruits, low-fat dairy products, and lean protein.  Do not eat a lot of foods high in solid fats, added sugars, or salt.  Get regular exercise. This is one of the most important things you can do for your health. ? Most adults should exercise for at least 150 minutes each week. The exercise should increase your heart rate and make you sweat (moderate-intensity exercise). ? Most adults should also do strengthening exercises at least twice a week. This is in addition to the moderate-intensity exercise. Maintain a healthy weight  Body mass index (BMI) is a measurement that can be used to identify possible weight problems. It estimates body fat based on height and weight. Your health care provider can help determine your BMI and help you achieve or maintain a healthy weight.  For females 23 years of age and older: ? A BMI below 18.5 is considered underweight. ? A BMI of 18.5 to 24.9 is normal. ? A BMI of 25 to 29.9 is considered overweight. ? A BMI of 30 and above is considered obese. Watch levels of cholesterol and blood lipids  You should start having your blood tested for lipids and cholesterol at 53 years of age, then have this test every 5 years.  You may need to have your cholesterol levels checked more often if: ? Your lipid or cholesterol levels are high. ? You are older than 53 years of age. ? You are at high risk for  heart disease. Cancer screening Lung Cancer  Lung cancer screening is recommended for adults 27-86 years old who are at high risk for lung cancer because of a history of smoking.  A yearly low-dose CT scan of the lungs is recommended for people who: ? Currently smoke. ? Have quit within the past 15 years. ? Have at least a 30-pack-year history of smoking. A pack year is smoking an average of one pack of cigarettes a day for 1 year.  Yearly screening should continue until it has been 15 years since you quit.  Yearly screening should stop if you develop a health problem that would prevent you from having lung cancer treatment. Breast Cancer  Practice breast self-awareness. This means understanding how your breasts normally appear and feel.  It also means doing regular breast self-exams. Let your health care provider know about any changes, no matter how small.  If you are in your 20s or 30s, you should have a clinical breast exam (CBE) by a health care provider every 1-3 years as part of a regular health exam.  If you are 40 or older, have a CBE every year. Also consider having a breast X-ray (mammogram) every year.  If you have a family history of breast cancer, talk to your health care provider about genetic screening.  If you are at high risk for breast cancer, talk to your health care provider about having an MRI and a mammogram every year.  Breast cancer gene (BRCA) assessment is recommended for women who have family members with BRCA-related cancers. BRCA-related cancers include: ? Breast. ? Ovarian. ? Tubal. ? Peritoneal cancers.  Results of the assessment will determine the need for genetic counseling and BRCA1 and BRCA2 testing. Cervical Cancer Your health care provider may recommend that you be screened regularly for cancer of the pelvic organs (ovaries, uterus, and vagina). This screening involves a pelvic examination, including checking for microscopic changes to the  surface of your cervix (Pap test). You may be encouraged to have this screening done every 3 years, beginning at age 45.  For women ages 73-65, health care providers may recommend pelvic exams and Pap testing every 3 years, or they may recommend the Pap and pelvic exam, combined with testing for human papilloma virus (HPV), every 5 years. Some types of HPV increase your risk of cervical cancer. Testing for HPV may also be done on women of any age with unclear Pap test results.  Other health care providers may not recommend any screening for nonpregnant women who are considered low risk for pelvic cancer and who do not have symptoms. Ask your health care provider if a screening pelvic exam is right for you.  If you have had past treatment for cervical cancer or a condition that could lead to cancer, you need Pap tests and screening for cancer for at least 20 years after your treatment. If Pap tests have been discontinued, your risk factors (such as having a new sexual partner) need to be reassessed to determine if screening should resume. Some women have medical problems that increase the chance of getting cervical cancer. In these cases, your health care provider may recommend more frequent screening and Pap tests. Colorectal Cancer  This type of cancer can be detected and often prevented.  Routine colorectal cancer screening usually begins at 53 years of age and continues through 53 years of age.  Your health care provider may recommend screening at an earlier age if you have risk factors for colon cancer.  Your health care provider may also recommend using home test kits to check for hidden blood in the stool.  A small camera at the end of a tube can be used to examine your colon directly (sigmoidoscopy or colonoscopy). This is done to check for the earliest forms of colorectal cancer.  Routine screening usually begins at age 2.  Direct examination of the colon should be repeated every 5-10  years through 53 years of age. However, you may need to be screened more often if early forms of precancerous polyps or small growths are found. Skin Cancer  Check your skin from head to toe regularly.  Tell your health care provider about any new moles or changes in moles, especially if there is a change in a mole's shape or color.  Also tell your health care provider if you have a mole that is larger than the size of a pencil eraser.  Always use sunscreen. Apply sunscreen liberally and repeatedly throughout the day.  Protect yourself by wearing long sleeves, pants, a wide-brimmed hat, and sunglasses whenever you  are outside. Heart disease, diabetes, and high blood pressure  High blood pressure causes heart disease and increases the risk of stroke. High blood pressure is more likely to develop in: ? People who have blood pressure in the high end of the normal range (130-139/85-89 mm Hg). ? People who are overweight or obese. ? People who are African American.  If you are 35-20 years of age, have your blood pressure checked every 3-5 years. If you are 68 years of age or older, have your blood pressure checked every year. You should have your blood pressure measured twice-once when you are at a hospital or clinic, and once when you are not at a hospital or clinic. Record the average of the two measurements. To check your blood pressure when you are not at a hospital or clinic, you can use: ? An automated blood pressure machine at a pharmacy. ? A home blood pressure monitor.  If you are between 102 years and 11 years old, ask your health care provider if you should take aspirin to prevent strokes.  Have regular diabetes screenings. This involves taking a blood sample to check your fasting blood sugar level. ? If you are at a normal weight and have a low risk for diabetes, have this test once every three years after 53 years of age. ? If you are overweight and have a high risk for diabetes,  consider being tested at a younger age or more often. Preventing infection Hepatitis B  If you have a higher risk for hepatitis B, you should be screened for this virus. You are considered at high risk for hepatitis B if: ? You were born in a country where hepatitis B is common. Ask your health care provider which countries are considered high risk. ? Your parents were born in a high-risk country, and you have not been immunized against hepatitis B (hepatitis B vaccine). ? You have HIV or AIDS. ? You use needles to inject street drugs. ? You live with someone who has hepatitis B. ? You have had sex with someone who has hepatitis B. ? You get hemodialysis treatment. ? You take certain medicines for conditions, including cancer, organ transplantation, and autoimmune conditions. Hepatitis C  Blood testing is recommended for: ? Everyone born from [birth year] through 1965. ? Anyone with known risk factors for hepatitis C. Sexually transmitted infections (STIs)  You should be screened for sexually transmitted infections (STIs) including gonorrhea and chlamydia if: ? You are sexually active and are younger than 53 years of age. ? You are older than 53 years of age and your health care provider tells you that you are at risk for this type of infection. ? Your sexual activity has changed since you were last screened and you are at an increased risk for chlamydia or gonorrhea. Ask your health care provider if you are at risk.  If you do not have HIV, but are at risk, it may be recommended that you take a prescription medicine daily to prevent HIV infection. This is called pre-exposure prophylaxis (PrEP). You are considered at risk if: ? You are sexually active and do not regularly use condoms or know the HIV status of your partner(s). ? You take drugs by injection. ? You are sexually active with a partner who has HIV. Talk with your health care provider about whether you are at high risk of being  infected with HIV. If you choose to begin PrEP, you should first be tested for HIV. You  should then be tested every 3 months for as long as you are taking PrEP. Pregnancy  If you are premenopausal and you may become pregnant, ask your health care provider about preconception counseling.  If you may become pregnant, take 400 to 800 micrograms (mcg) of folic acid every day.  If you want to prevent pregnancy, talk to your health care provider about birth control (contraception). Osteoporosis and menopause  Osteoporosis is a disease in which the bones lose minerals and strength with aging. This can result in serious bone fractures. Your risk for osteoporosis can be identified using a bone density scan.  If you are 37 years of age or older, or if you are at risk for osteoporosis and fractures, ask your health care provider if you should be screened.  Ask your health care provider whether you should take a calcium or vitamin D supplement to lower your risk for osteoporosis.  Menopause may have certain physical symptoms and risks.  Hormone replacement therapy may reduce some of these symptoms and risks. Talk to your health care provider about whether hormone replacement therapy is right for you. Follow these instructions at home:  Schedule regular health, dental, and eye exams.  Stay current with your immunizations.  Do not use any tobacco products including cigarettes, chewing tobacco, or electronic cigarettes.  If you are pregnant, do not drink alcohol.  If you are breastfeeding, limit how much and how often you drink alcohol.  Limit alcohol intake to no more than 1 drink per day for nonpregnant women. One drink equals 12 ounces of beer, 5 ounces of wine, or 1 ounces of hard liquor.  Do not use street drugs.  Do not share needles.  Ask your health care provider for help if you need support or information about quitting drugs.  Tell your health care provider if you often feel  depressed.  Tell your health care provider if you have ever been abused or do not feel safe at home. This information is not intended to replace advice given to you by your health care provider. Make sure you discuss any questions you have with your health care provider. Document Released: 02/14/2011 Document Revised: 01/07/2016 Document Reviewed: 05/05/2015 Elsevier Interactive Patient Education  2019 [place] such as bacteria, viruses, and parasites are found everywhere. They can be in the air and water. They can also be on surfaces like food, door handles, and your skin. Every day, your hands touch germs. Many of these germs can make you and your family sick. Washing your hands is one of the best ways to lower your risk of getting and sharing germs. When should I wash my hands? You should wash your hands whenever you think they are dirty. You should also wash your hands:  Before: ? Visiting a baby or anyone with a weakened disease-fighting system (immunesystem). ? Putting in and taking out contact lenses.  After: ? Using the bathroom or helping someone else use the bathroom. ? Working or playing outside. ? Touching or taking out the garbage. ? Touching anything dirty around your home. ? Sneezing, coughing, or blowing your nose. ? Using a phone, including your mobile phone. ? Touching an animal, animal food, animal poop, or its toys or leash. ? Touching money. ? Using household cleaners or poisonous chemicals. ? Handling dirty clothes, bedding, or rags. ? Using public transportation. ? Going shopping, especially if you use a shopping cart or basket. ? Shaking hands. ? Handling livestock, such as  cows or sheep.  Before and after: ? Preparing food. ? Eating. ? Visiting or taking care of someone who is sick. This includes touching used tissues, toys, and clothes. ? Changing a bandage (dressing). ? Taking care of an injury or wound. ? Giving or taking  medicine. ? Preparing a bottle for a baby. ? Feeding a baby or young child. ? Changing a diaper. What is the right way to wash my hands?  1. Wet your hands with clean, running water. Turn off the water or move your hands out of the running water. 2. Apply liquid soap or bar soap to your hands. 3. Rub your hands together quickly to create lather. 4. Keep rubbing your hands together for at least 20 seconds. Thoroughly scrub all parts of your hands. This includes scrubbing under your fingernails and between your fingers. 5. Rinse your hands with clean, running water. Do this until all the soap is gone. 6. Dry your hands using an air dryer or a clean paper or cloth towel, or let your hands air-dry. Do not use your clothing or a dirty towel to dry your hands. If you are in a public restroom, use your towel:  To turn off the water faucet.  To open the bathroom door. How can I clean my hands if I do not have soap and water?  If soap and clean water are not available, use a hand-washing wipe, spray, or gel (hand sanitizer). Use one that contains at least 60% alcohol. If you are handling food, gels are not recommended as a replacement for hand washing with soap and water. To use these products, follow the directions on the product, and:  Apply enough product to cover your hands.  Make sure you wipe, rub, or spray the product so that it reaches every part of your hands and wrists. Include the backs of your hands, between your fingers, and under your fingernails.  Rub the product onto your hands until it dries. Summary  Every day, your hands touch germs. Many of these germs can make you and your family sick.  Washing your hands is one of the best ways to lower your risk of getting and sharing germs.  If soap and clean water are not available, use a hand-washing wipe, spray, or gel. This information is not intended to replace advice given to you by your health care provider. Make sure you  discuss any questions you have with your health care provider. Document Released: 07/14/2008 Document Revised: 05/10/2017 Document Reviewed: 05/10/2017 Elsevier Interactive Patient Education  2019 [employer].

## 2018-11-01 NOTE — Progress Notes (Signed)
Patient [name]   Progress Note: General Provider: Lanae Boast, FNP  SUBJECTIVE:   Jessica Lawson is a 53 y.o. female who  has a past medical history of Back pain, Diabetes mellitus without complication ([place]), High blood cholesterol, Hypertension, Kidney stone, Normal cardiac stress test (02/2016), Obesity, Pain of cheek, Tobacco use, UTI (lower urinary tract infection), and Wrist pain.. Patient presents today for Follow-up and Otalgia (left side) Patient reports compliance with all medications.  Patient denies side effects of medications.   She states that she has left ear pain after washing her hair. Has not used anything to help with pain. Denies pressure, runny nose, sore throat or cough. Denies hx of seasonal allergies.    Review of Systems  Constitutional: Negative.   HENT: Positive for ear pain (left).   Respiratory: Negative.   Cardiovascular: Negative.   Neurological: Negative.   Psychiatric/Behavioral: Negative.      OBJECTIVE: BP (!) 143/68 (BP Location: Right Arm, Patient Position: Sitting, Cuff Size: Normal)   Pulse 98   Temp 97.9 F (36.6 C) (Oral)   Resp 16   Ht 4\' 11"  (1.499 m)   Wt 174 lb 3.2 oz (79 kg)   LMP 08/22/2011 Comment: patient states LMP 20 years ago  SpO2 98%   BMI 35.18 kg/m   Wt Readings from Last 3 Encounters:  11/01/18 174 lb 3.2 oz (79 kg)  10/03/18 172 lb (78 kg)  09/18/18 175 lb 3.2 oz (79.5 kg)     Physical Exam Vitals signs and nursing note reviewed.  Constitutional:      General: She is not in acute distress.    Appearance: She is well-developed.  HENT:     Head: Normocephalic and atraumatic.     Right Ear: Tympanic membrane normal.     Left Ear: There is impacted cerumen.  Eyes:     Conjunctiva/sclera: Conjunctivae normal.     Pupils: Pupils are equal, round, and reactive to light.  Neck:     Musculoskeletal: Normal range of motion.  Cardiovascular:     Rate and Rhythm:  Normal rate and regular rhythm.     Heart sounds: Normal heart sounds.  Pulmonary:     Effort: Pulmonary effort is normal. No respiratory distress.     Breath sounds: Normal breath sounds.  Musculoskeletal: Normal range of motion.  Skin:    General: Skin is warm and dry.  Neurological:     Mental Status: She is alert and oriented to person, place, and time.  Psychiatric:        Mood and Affect: Mood normal.        Behavior: Behavior normal.        Thought Content: Thought content normal.        Judgment: Judgment normal.     ASSESSMENT/PLAN:   1. Essential hypertension No medication changes warranted at the present time.   - POCT urinalysis dipstick - Lipid Panel - Comprehensive metabolic panel  2. Pre-diabetes The patient is asked to make an attempt to improve diet and exercise patterns to aid in medical management of this problem. A1C 6.1 - POCT glycosylated hemoglobin (Hb A1C) - POCT glucose (manual entry)  3. Otalgia of left ear D/t cerumen impaction. Ear lavage performed and provided relief of pain  4. Need for Tdap vaccination  vaccination given in the office visit today.  - Tdap vaccine greater than or equal to 7yo IM   Return in about 3 months (  around 02/01/2019), or if symptoms worsen or fail to improve.    The patient was given clear instructions to go to ER or return to medical center if symptoms do not improve, worsen or new problems develop. The patient verbalized understanding and agreed with plan of care.   [name], FNP-BC Patient [facility] [address] [place], [place] [address] [phone]

## 2018-11-02 LAB — COMPREHENSIVE METABOLIC PANEL
ALT: 30 IU/L (ref 0–32)
AST: 21 IU/L (ref 0–40)
Albumin/Globulin Ratio: 1.5 (ref 1.2–2.2)
Albumin: 4.5 g/dL (ref 3.8–4.9)
Alkaline Phosphatase: 49 IU/L (ref 39–117)
BUN/Creatinine Ratio: 15 (ref 9–23)
BUN: 12 mg/dL (ref 6–24)
Bilirubin Total: 0.3 mg/dL (ref 0.0–1.2)
CO2: 22 mmol/L (ref 20–29)
Calcium: 9.4 mg/dL (ref 8.7–10.2)
Chloride: 101 mmol/L (ref 96–106)
Creatinine, Ser: 0.8 mg/dL (ref 0.57–1.00)
GFR calc Af Amer: 98 mL/min/{1.73_m2} (ref >59)
GFR calc non Af Amer: 85 mL/min/{1.73_m2} (ref >59)
Globulin, Total: 3.1 g/dL (ref 1.5–4.5)
Glucose: 97 mg/dL (ref 65–99)
Potassium: 3.8 mmol/L (ref 3.5–5.2)
Sodium: 139 mmol/L (ref 134–144)
Total Protein: 7.6 g/dL (ref 6.0–8.5)

## 2018-11-02 LAB — LIPID PANEL
Chol/HDL Ratio: 5.1 ratio — ABNORMAL HIGH (ref 0.0–4.4)
Cholesterol, Total: 190 mg/dL (ref 100–199)
HDL: 37 mg/dL — ABNORMAL LOW (ref >39)
LDL Calculated: 117 mg/dL — ABNORMAL HIGH (ref 0–99)
Triglycerides: 182 mg/dL — ABNORMAL HIGH (ref 0–149)
VLDL Cholesterol Cal: 36 mg/dL (ref 5–40)

## 2018-11-09 ENCOUNTER — Telehealth: Payer: Self-pay

## 2018-11-09 NOTE — Telephone Encounter (Signed)
Pt returned call, reviewed lab results with pt. Pt verbalized understanding.     Notes recorded by Lanae Boast, [place] on 11/09/2018 at 12:02 PM EDT Please let her know that her overall cholesterol has improved! Her triglycerides went down 116 points!!! Bad cholesterol (LDL) went down 137 points. Keep up the good work. It is paying off.

## 2018-11-25 ENCOUNTER — Other Ambulatory Visit: Payer: Self-pay | Admitting: Family Medicine

## 2018-11-25 DIAGNOSIS — E119 Type 2 diabetes mellitus without complications: Secondary | ICD-10-CM

## 2018-11-25 DIAGNOSIS — E785 Hyperlipidemia, unspecified: Secondary | ICD-10-CM

## 2018-11-27 ENCOUNTER — Other Ambulatory Visit: Payer: Self-pay

## 2018-12-12 ENCOUNTER — Telehealth: Payer: Self-pay

## 2018-12-12 NOTE — Telephone Encounter (Signed)
Virtual Visit Pre-Appointment Phone Call  "(Name), I am calling you today to discuss your upcoming appointment. We are currently trying to limit exposure to the virus that causes COVID-19 by seeing patients at home rather than in the office."  "What is the BEST phone number to call the day of the visit?"  [phone] ANDROID  Confirm consent - "In the setting of the current Covid19 crisis, you are scheduled for a (phone or video) visit with your provider on (date) at (time).  Just as we do with many in-office visits, in order for you to participate in this visit, we must obtain consent.  If you'd like, I can send this to your mychart (if signed up) or email for you to review.  Otherwise, I can obtain your verbal consent now.  All virtual visits are billed to your insurance company just like a normal visit would be.  By agreeing to a virtual visit, we'd like you to understand that the technology does not allow for your provider to perform an examination, and thus may limit your provider's ability to fully assess your condition. If your provider identifies any concerns that need to be evaluated in person, we will make arrangements to do so.  Finally, though the technology is pretty good, we cannot assure that it will always work on either your or our end, and in the setting of a video visit, we may have to convert it to a phone-only visit.  In either situation, we cannot ensure that we have a secure connection.  Are you willing to proceed?" CONSENT OBTAINED  1. Advise patient to be prepared - "Two hours prior to your appointment, go ahead and check your blood pressure, pulse, oxygen saturation, and your weight (if you have the equipment to check those) and write them all down. When your visit starts, your provider will ask you for this information. If you have an Apple Watch or Kardia device, please plan to have heart rate information ready on the day of your appointment. Please have a pen and paper  handy nearby the day of the visit as well." SHE HAS NO BP CUFF BUT WILL TAKE WEIGHT  2. Give patient instructions for [email or web address]   3. Inform patient they will receive a phone call 15 minutes prior to their appointment time (may be from unknown caller ID) so they should be prepared to answer    TELEPHONE CALL NOTE  Jessica Lawson has been deemed a candidate for a follow-up tele-health visit to limit community exposure during the Covid-19 pandemic. I spoke with the patient via phone to ensure availability of phone/video source, confirm preferred email & phone number, and discuss instructions and expectations.  I reminded Jessica Lawson to be prepared with any vital sign and/or heart rhythm information that could potentially be obtained via home monitoring, at the time of her visit. I reminded Jessica Lawson to expect a phone call prior to her visit.  Waylan Rocher, LPN 0/98/1191 47:82 PM  PT DECLINES MYCHART AT THIS TIME  IF USING [email or web address] - The patient will receive a link just prior to their visit by text.     FULL LENGTH CONSENT FOR TELE-HEALTH VISIT   I hereby voluntarily request, consent and authorize [facility] and its employed or contracted physicians, physician assistants, nurse practitioners or other licensed health care professionals (the Practitioner), to provide me with telemedicine health care services (the "Services") as deemed necessary by the treating Practitioner. I acknowledge and consent to receive  the Services by the Practitioner via telemedicine. I understand that the telemedicine visit will involve communicating with the Practitioner through live audiovisual communication technology and the disclosure of certain medical information by electronic transmission. I acknowledge that I have been given the opportunity to request an in-person assessment or other available alternative prior to the telemedicine visit and am voluntarily participating in the telemedicine visit.   I understand that I have the right to withhold or withdraw my consent to the use of telemedicine in the course of my care at any time, without affecting my right to future care or treatment, and that the Practitioner or I may terminate the telemedicine visit at any time. I understand that I have the right to inspect all information obtained and/or recorded in the course of the telemedicine visit and may receive copies of available information for a reasonable fee.  I understand that some of the potential risks of receiving the Services via telemedicine include:  Marland Kitchen Delay or interruption in medical evaluation due to technological equipment failure or disruption; . Information transmitted may not be sufficient (e.g. poor resolution of images) to allow for appropriate medical decision making by the Practitioner; and/or  . In rare instances, security protocols could fail, causing a breach of personal health information.  Furthermore, I acknowledge that it is my responsibility to provide information about my medical history, conditions and care that is complete and accurate to the best of my ability. I acknowledge that Practitioner's advice, recommendations, and/or decision may be based on factors not within their control, such as incomplete or inaccurate data provided by me or distortions of diagnostic images or specimens that may result from electronic transmissions. I understand that the practice of medicine is not an exact science and that Practitioner makes no warranties or guarantees regarding treatment outcomes. I acknowledge that I will receive a copy of this consent concurrently upon execution via email to the email address I last provided but may also request a printed copy by calling the office of [facility].    I understand that my insurance will be billed for this visit.   I have read or had this consent read to me. . I understand the contents of this consent, which adequately explains the  benefits and risks of the Services being provided via telemedicine.  . I have been provided ample opportunity to ask questions regarding this consent and the Services and have had my questions answered to my satisfaction. . I give my informed consent for the services to be provided through the use of telemedicine in my medical care  By participating in this telemedicine visit I agree to the above.  EMAILED FULL LENGTH CONSENTS

## 2018-12-20 ENCOUNTER — Telehealth: Payer: Self-pay | Admitting: Internal Medicine

## 2018-12-20 ENCOUNTER — Other Ambulatory Visit: Payer: Self-pay | Admitting: Family Medicine

## 2018-12-20 NOTE — Telephone Encounter (Signed)
Mychart pending, smartphone, pre reg complete 12/20/18 AF

## 2018-12-24 ENCOUNTER — Telehealth: Payer: Self-pay

## 2018-12-24 ENCOUNTER — Telehealth (INDEPENDENT_AMBULATORY_CARE_PROVIDER_SITE_OTHER): Payer: Medicaid Other | Admitting: Internal Medicine

## 2018-12-24 VITALS — BP 127/80 | Wt 171.1 lb

## 2018-12-24 DIAGNOSIS — E785 Hyperlipidemia, unspecified: Secondary | ICD-10-CM

## 2018-12-24 DIAGNOSIS — E782 Mixed hyperlipidemia: Secondary | ICD-10-CM

## 2018-12-24 DIAGNOSIS — R0789 Other chest pain: Secondary | ICD-10-CM | POA: Diagnosis not present

## 2018-12-24 DIAGNOSIS — E669 Obesity, unspecified: Secondary | ICD-10-CM

## 2018-12-24 DIAGNOSIS — Z789 Other specified health status: Secondary | ICD-10-CM

## 2018-12-24 DIAGNOSIS — I10 Essential (primary) hypertension: Secondary | ICD-10-CM

## 2018-12-24 DIAGNOSIS — R0683 Snoring: Secondary | ICD-10-CM

## 2018-12-24 MED ORDER — OMEGA-3-ACID ETHYL ESTERS 1 G PO CAPS: ORAL_CAPSULE | ORAL | 1 refills | Status: DC

## 2018-12-24 NOTE — Patient Instructions (Signed)
Medication Instructions:  The current medical regimen is effective;  continue present plan and medications.  If you need a refill on your cardiac medications before your next appointment, please call your pharmacy.    Follow-Up: At [facility], you and your health needs are our priority.  As part of our continuing mission to provide you with exceptional heart care, we have created designated Provider Care Teams.  These Care Teams include your primary Cardiologist (physician) and Advanced Practice Providers (APPs -  Physician Assistants and Nurse Practitioners) who all work together to provide you with the care you need, when you need it. . Follow up 1 month with Pharmacist for lipid clinic. . Follow up in 3 months with Dr.Acharya.  We will be in touch regarding appointments.

## 2018-12-24 NOTE — Progress Notes (Signed)
Virtual Visit via Telephone Note   This visit type was conducted due to national recommendations for restrictions regarding the COVID-19 Pandemic (e.g. social distancing) in an effort to limit this patient's exposure and mitigate transmission in our community.  Due to her co-morbid illnesses, this patient is at least at moderate risk for complications without adequate follow up.  This format is felt to be most appropriate for this patient at this time.  The patient did not have access to video technology/had technical difficulties with video requiring transitioning to audio format only (telephone).  All issues noted in this document were discussed and addressed.  No physical exam could be performed with this format.  Please refer to the patient's chart for her  consent to telehealth for [facility].   Date:  12/24/2018   ID:  Jessica Lawson, DOB 1965/11/02, MRN [id number]  Patient Location: Home Provider Location: Home  PCP:  Lanae Boast, [place]  Cardiologist:  Elouise Munroe, MD  Electrophysiologist:  None   Evaluation Performed:  Follow-Up Visit  Chief Complaint:  F/u chest pain and HLD  History of Present Illness:    Jessica Lawson is a 53 y.o. female with a hx of DM2 on oral therapy, hyperlipidemia, hypertensionwho presents today for follow-up of chest discomfort. She has had one episode of chest discomfort last week but otherwise has been symptom free and very active, walking 5-6 miles a day without symptoms.   She continues to have elevated lipids and is following with our colleagues in lipid clinic. She is alternating use of rosuvastatin and fibrate at Dr. Charlies Constable direction.   She is other well, in good spirits and has no specific concerns. The patient denies dyspnea at rest or with exertion, palpitations, PND, orthopnea, or leg swelling. Denies syncope or presyncope. Denies dizziness or lightheadedness. Endorses snoring.  The patient does not have symptoms concerning for  COVID-19 infection (fever, chills, cough, or new shortness of breath).    Past Medical History:  Diagnosis Date  . Back pain   . Diabetes mellitus without complication ([place])   . High blood cholesterol   . Hypertension   . Kidney stone   . Normal cardiac stress test 02/2016   low risk  . Obesity   . Pain of cheek    and right side neck  . Tobacco use   . UTI (lower urinary tract infection)   . Wrist pain    left wrist   Past Surgical History:  Procedure Laterality Date  . CESAREAN SECTION     3 c-sections     Current Meds  Medication Sig  . ACCU-CHEK AVIVA PLUS test strip TEST BLOOD SUGAR UP TO FOUR TIMES DAILY AS DIRECTED  . ACCU-CHEK SOFTCLIX LANCETS lancets USE UP TO FOUR TIMES DAILY AS DIRECTED.  Marland Kitchen albuterol (PROVENTIL HFA;VENTOLIN HFA) 108 (90 Base) MCG/ACT inhaler Inhale 2 puffs into the lungs every 4 (four) hours as needed for wheezing or shortness of breath.  . blood glucose meter kit and supplies KIT Dispense based on patient and insurance preference. Use up to four times daily as directed. (FOR ICD-9 250.00, 250.01).  . cetirizine (ZYRTEC) 10 MG tablet Take 1 tablet (10 mg total) by mouth daily.  . fenofibrate (TRICOR) 48 MG tablet Take 1 tablet (48 mg total) by mouth daily.  . fluticasone (FLONASE) 50 MCG/ACT nasal spray Place 2 sprays into both nostrils daily.  . hydrochlorothiazide (HYDRODIURIL) 12.5 MG tablet TAKE 1 TABLET(12.5 MG) BY MOUTH DAILY  . loratadine (  CLARITIN) 10 MG tablet Take 1 tablet (10 mg total) by mouth daily.  . metFORMIN (GLUCOPHAGE) 500 MG tablet TAKE 1 TABLET(500 MG) BY MOUTH TWICE DAILY WITH A MEAL  . Multiple Vitamins-Minerals (MULTIVITAMIN WITH MINERALS) tablet Take 1 tablet by mouth daily.  . rosuvastatin (CRESTOR) 10 MG tablet Take 1 tablet up to 3 times per week as tolerated  . [DISCONTINUED] omega-3 acid ethyl esters (LOVAZA) 1 g capsule TAKE 1 CAPSULE( 1 GRAM TOTAL) BY MOUTH DAILY     Allergies:   Asa [aspirin]; Iohexol;  Penicillins; Sulfa antibiotics; and Nystatin   Social History   Tobacco Use  . Smoking status: Former Smoker    Packs/day: 1.00    Types: Cigarettes    Last attempt to quit: 07/26/2016    Years since quitting: 2.4  . Smokeless tobacco: Never Used  Substance Use Topics  . Alcohol use: No  . Drug use: No     Family Hx: The patient's family history includes Diabetes in her mother, sister, sister, and another family member; Hyperlipidemia in an other family member; Hypertension in her sister and another family member; Lung disease in her father.  ROS:   Please see the history of present illness.    All other systems reviewed and are negative.   Prior CV studies:   The following studies were reviewed today:    Labs/Other Tests and Data Reviewed:    EKG:  No ECG reviewed.  Recent Labs: 05/18/2018: Hemoglobin 14.3; Platelets 356 11/01/2018: ALT 30; BUN 12; Creatinine, Ser 0.80; Potassium 3.8; Sodium 139   Recent Lipid Panel Lab Results  Component Value Date/Time   CHOL 190 11/01/2018 11:10 AM   TRIG 182 (H) 11/01/2018 11:10 AM   HDL 37 (L) 11/01/2018 11:10 AM   CHOLHDL 5.1 (H) 11/01/2018 11:10 AM   CHOLHDL 7.3 (H) 07/12/2017 09:50 AM   LDLCALC 117 (H) 11/01/2018 11:10 AM   LDLCALC 183 (H) 07/12/2017 09:50 AM    Wt Readings from Last 3 Encounters:  12/24/18 171 lb 1.6 oz (77.6 kg)  11/01/18 174 lb 3.2 oz (79 kg)  10/03/18 172 lb (78 kg)     Objective:    Vital Signs:  BP 127/80   Wt 171 lb 1.6 oz (77.6 kg)   LMP 08/22/2011 Comment: patient states LMP 20 years ago  BMI 34.56 kg/m    VITAL SIGNS:  reviewed GEN:  no acute distress  ASSESSMENT & PLAN:    1. Atypical chest pain   2. Snoring   3. Essential hypertension   4. Statin intolerance   5. Mixed hyperlipidemia   6. Obesity (BMI 30-39.9)    Chest pain - infrequent and patient would like to monitor, consider CTA if episodes increase  Snoring - ongoing sleep evaluation  HTN - stable, well controlled  today on HCTZ  HLD/Statin intolerance - following with lipid clinic, considering alternate therapies while trialing rosuvastatin. Tolerating well so far.   Obesity - we discussed strategies for weight loss. She is walking frequently and I commended her for this and reviewed exercise and diet recommendations.   Exercise recommendations: Goal of exercising for at least 30 minutes a day, at least 5 times per week.  Please exercise to a moderate exertion.  This means that while exercising it is difficult to speak in full sentences, however you are not so short of breath that you feel you must stop, and not so comfortable that you can carry on a full conversation.  Exertion level should be  approximately a 5/10, if 10 is the most exertion you can perform.  Diet recommendations: Recommend a heart healthy diet such as the Mediterranean diet.  This diet consists of plant based foods, healthy fats, lean meats, olive oil.  It suggests limiting the intake of simple carbohydrates such as white breads, pastries, and pastas.  It also limits the amount of red meat, wine, and dairy products such as cheese that one should consume on a daily basis.  COVID-19 Education: The signs and symptoms of COVID-19 were discussed with the patient and how to seek care for testing (follow up with PCP or arrange E-visit).  The importance of social distancing was discussed today.  Time:   Today, I have spent 25 minutes with the patient with telehealth technology discussing the above problems.     Medication Adjustments/Labs and Tests Ordered: Current medicines are reviewed at length with the patient today.  Concerns regarding medicines are outlined above.   Tests Ordered: No orders of the defined types were placed in this encounter.   Medication Changes: No orders of the defined types were placed in this encounter.   Disposition:  Follow up in 3 month(s)  Signed, Elouise Munroe, MD  12/24/2018 9:35 PM    [facility]  Medication Instructions:  The current medical regimen is effective;  continue present plan and medications.  If you need a refill on your cardiac medications before your next appointment, please call your pharmacy.    Follow-Up: At [facility], you and your health needs are our priority.  As part of our continuing mission to provide you with exceptional heart care, we have created designated Provider Care Teams.  These Care Teams include your primary Cardiologist (physician) and Advanced Practice Providers (APPs -  Physician Assistants and Nurse Practitioners) who all work together to provide you with the care you need, when you need it. . Follow up 1 month with Pharmacist for lipid clinic. . Follow up in 3 months with Dr.Alaa Mullally.  We will be in touch regarding appointments.

## 2018-12-24 NOTE — Telephone Encounter (Signed)
Refill for lovaza sent to [facility].

## 2019-01-31 ENCOUNTER — Other Ambulatory Visit: Payer: Self-pay | Admitting: Family Medicine

## 2019-02-04 ENCOUNTER — Ambulatory Visit: Payer: 59 | Admitting: Family Medicine

## 2019-02-07 ENCOUNTER — Telehealth: Payer: Self-pay

## 2019-02-07 NOTE — Telephone Encounter (Signed)
Called to do COVID -19 Screening. Patient preferred to have visit via telephone. Thanks!

## 2019-02-08 ENCOUNTER — Ambulatory Visit (INDEPENDENT_AMBULATORY_CARE_PROVIDER_SITE_OTHER): Payer: Medicaid Other | Admitting: Family Medicine

## 2019-02-08 ENCOUNTER — Other Ambulatory Visit: Payer: Self-pay

## 2019-02-08 DIAGNOSIS — I10 Essential (primary) hypertension: Secondary | ICD-10-CM | POA: Diagnosis not present

## 2019-02-08 DIAGNOSIS — E782 Mixed hyperlipidemia: Secondary | ICD-10-CM

## 2019-02-08 DIAGNOSIS — Z1211 Encounter for screening for malignant neoplasm of colon: Secondary | ICD-10-CM

## 2019-02-08 DIAGNOSIS — E119 Type 2 diabetes mellitus without complications: Secondary | ICD-10-CM | POA: Diagnosis not present

## 2019-02-08 NOTE — Progress Notes (Signed)
  Patient [facility]  Virtual Visit via Telephone Note  I connected with Jessica Lawson on 02/08/19 at 10:00 AM EDT by telephone and verified that I am speaking with the correct person using two identifiers.   I discussed the limitations, risks, security and privacy concerns of performing an evaluation and management service by telephone and the availability of in person appointments. I also discussed with the patient that there may be a patient responsible charge related to this service. The patient expressed understanding and agreed to proceed.   History of Present Illness: Jessica Lawson  has a past medical history of Back pain, Diabetes mellitus without complication ([place]), High blood cholesterol, Hypertension, Kidney stone, Normal cardiac stress test (02/2016), Obesity, Pain of cheek, Tobacco use, UTI (lower urinary tract infection), and Wrist pain. Patient reports compliance with all medications.  Patient denies side effects of medications.  Fasting glucose level ranges 99-135. She continues to walk daily for exercise.    Observations/Objective: Patient with regular voice tone, rate and rhythm. Speaking calmly and is in no apparent distress.    Assessment and Plan: 1. Screen for colon cancer - Cologuard  2. Essential hypertension  3. Hypercholesterolemia with hypertriglyceridemia - Lipid Panel With LDL/HDL Ratio; Future  4. Type 2 diabetes mellitus without complication, without long-term current use of insulin (HCC) - Comprehensive metabolic panel; Future - Hemoglobin A1c; Future     Follow Up Instructions:  We discussed hand washing, using hand sanitizer when soap and water are not available, only going out when absolutely necessary, and social distancing. Explained to patient that she is immunocompromised and will need to take precautions during this time.   I discussed the assessment and treatment plan with the patient. The patient  was provided an opportunity to ask questions and all were answered. The patient agreed with the plan and demonstrated an understanding of the instructions.   The patient was advised to call back or seek an in-person evaluation if the symptoms worsen or if the condition fails to improve as anticipated.  I provided 7 minutes of non-face-to-face time during this encounter.  Ms. Andr L. Nathaneil Canary, FNP-BC Patient [facility] [address] [place], [place] [address] [phone]

## 2019-02-12 ENCOUNTER — Other Ambulatory Visit: Payer: Medicaid Other

## 2019-02-12 ENCOUNTER — Other Ambulatory Visit: Payer: Self-pay

## 2019-02-12 DIAGNOSIS — E119 Type 2 diabetes mellitus without complications: Secondary | ICD-10-CM | POA: Diagnosis not present

## 2019-02-12 DIAGNOSIS — E782 Mixed hyperlipidemia: Secondary | ICD-10-CM

## 2019-02-13 ENCOUNTER — Encounter: Payer: Self-pay | Admitting: Family Medicine

## 2019-02-13 LAB — COMPREHENSIVE METABOLIC PANEL
ALT: 29 IU/L (ref 0–32)
AST: 18 IU/L (ref 0–40)
Albumin/Globulin Ratio: 1.8 (ref 1.2–2.2)
Albumin: 4.4 g/dL (ref 3.8–4.9)
Alkaline Phosphatase: 48 IU/L (ref 39–117)
BUN/Creatinine Ratio: 19 (ref 9–23)
BUN: 16 mg/dL (ref 6–24)
Bilirubin Total: 0.3 mg/dL (ref 0.0–1.2)
CO2: 23 mmol/L (ref 20–29)
Calcium: 9.7 mg/dL (ref 8.7–10.2)
Chloride: 103 mmol/L (ref 96–106)
Creatinine, Ser: 0.84 mg/dL (ref 0.57–1.00)
GFR calc Af Amer: 92 mL/min/{1.73_m2} (ref >59)
GFR calc non Af Amer: 80 mL/min/{1.73_m2} (ref >59)
Globulin, Total: 2.5 g/dL (ref 1.5–4.5)
Glucose: 122 mg/dL — ABNORMAL HIGH (ref 65–99)
Potassium: 3.8 mmol/L (ref 3.5–5.2)
Sodium: 143 mmol/L (ref 134–144)
Total Protein: 6.9 g/dL (ref 6.0–8.5)

## 2019-02-13 LAB — LIPID PANEL WITH LDL/HDL RATIO
Cholesterol, Total: 190 mg/dL (ref 100–199)
HDL: 36 mg/dL — ABNORMAL LOW (ref >39)
LDL Calculated: 109 mg/dL — ABNORMAL HIGH (ref 0–99)
LDl/HDL Ratio: 3 ratio (ref 0.0–3.2)
Triglycerides: 227 mg/dL — ABNORMAL HIGH (ref 0–149)
VLDL Cholesterol Cal: 45 mg/dL — ABNORMAL HIGH (ref 5–40)

## 2019-02-13 LAB — HEMOGLOBIN A1C
Est. average glucose Bld gHb Est-mCnc: 137 mg/dL
Hgb A1c MFr Bld: 6.4 % — ABNORMAL HIGH (ref 4.8–5.6)

## 2019-02-13 NOTE — Progress Notes (Signed)
Please print this lab letter and send to [name]

## 2019-02-14 DIAGNOSIS — Z1211 Encounter for screening for malignant neoplasm of colon: Secondary | ICD-10-CM | POA: Diagnosis not present

## 2019-02-14 DIAGNOSIS — Z1212 Encounter for screening for malignant neoplasm of rectum: Secondary | ICD-10-CM | POA: Diagnosis not present

## 2019-02-20 ENCOUNTER — Encounter: Payer: Self-pay | Admitting: Family Medicine

## 2019-03-01 ENCOUNTER — Other Ambulatory Visit: Payer: Self-pay | Admitting: Family Medicine

## 2019-03-01 DIAGNOSIS — R195 Other fecal abnormalities: Secondary | ICD-10-CM

## 2019-03-01 NOTE — Progress Notes (Signed)
Called and spoke with patient. Advised that cologard results were positive and that we are sending her to GI for colonoscopy and further evaluation. Patient verbalized understanding. Thanks!

## 2019-03-13 ENCOUNTER — Telehealth: Payer: Self-pay | Admitting: *Deleted

## 2019-03-13 NOTE — Telephone Encounter (Signed)
LVMTCB

## 2019-03-14 NOTE — Telephone Encounter (Signed)
Follow up   Patient is returning call. Not sure what the call was about.

## 2019-03-14 NOTE — Telephone Encounter (Signed)
Rosuvastatin may cause a small increase in BG levels. Change should not be significant but it may discussed with her PCP that manage her diabetes.

## 2019-03-14 NOTE — Telephone Encounter (Signed)
Returned call to patient she stated someone from our office called her yesterday.Advised after reviewing her chart unable to know who called her.She wanted to know if Crestor can cause elevated blood sugar.Stated her blood sugar is elevated in mornings.Advised I will send message to our pharmacist for advice.

## 2019-03-14 NOTE — Telephone Encounter (Signed)
Returned call to patient Jessica Lawson's advice given. 

## 2019-03-15 ENCOUNTER — Encounter (HOSPITAL_COMMUNITY): Payer: Self-pay

## 2019-03-15 ENCOUNTER — Telehealth: Payer: Self-pay | Admitting: *Deleted

## 2019-03-15 LAB — COLOGUARD

## 2019-03-15 NOTE — Telephone Encounter (Signed)

## 2019-03-27 ENCOUNTER — Telehealth: Payer: Medicaid Other | Admitting: Internal Medicine

## 2019-03-30 ENCOUNTER — Other Ambulatory Visit: Payer: Self-pay | Admitting: Family Medicine

## 2019-03-30 DIAGNOSIS — I10 Essential (primary) hypertension: Secondary | ICD-10-CM

## 2019-04-02 DIAGNOSIS — H25013 Cortical age-related cataract, bilateral: Secondary | ICD-10-CM | POA: Diagnosis not present

## 2019-04-02 DIAGNOSIS — E119 Type 2 diabetes mellitus without complications: Secondary | ICD-10-CM | POA: Diagnosis not present

## 2019-04-02 DIAGNOSIS — H40013 Open angle with borderline findings, low risk, bilateral: Secondary | ICD-10-CM | POA: Diagnosis not present

## 2019-04-02 DIAGNOSIS — H2513 Age-related nuclear cataract, bilateral: Secondary | ICD-10-CM | POA: Diagnosis not present

## 2019-04-04 ENCOUNTER — Encounter: Payer: Self-pay | Admitting: Gastroenterology

## 2019-04-14 ENCOUNTER — Encounter: Payer: Self-pay | Admitting: Surgery

## 2019-04-16 ENCOUNTER — Encounter: Payer: Self-pay | Admitting: Cardiology

## 2019-04-16 ENCOUNTER — Telehealth (INDEPENDENT_AMBULATORY_CARE_PROVIDER_SITE_OTHER): Payer: Medicaid Other | Admitting: Cardiology

## 2019-04-16 ENCOUNTER — Other Ambulatory Visit: Payer: Self-pay

## 2019-04-16 VITALS — BP 138/68 | HR 61 | Ht 59.0 in | Wt 178.4 lb

## 2019-04-16 DIAGNOSIS — I10 Essential (primary) hypertension: Secondary | ICD-10-CM

## 2019-04-16 DIAGNOSIS — Z7182 Exercise counseling: Secondary | ICD-10-CM

## 2019-04-16 DIAGNOSIS — Z7189 Other specified counseling: Secondary | ICD-10-CM

## 2019-04-16 DIAGNOSIS — E782 Mixed hyperlipidemia: Secondary | ICD-10-CM | POA: Diagnosis not present

## 2019-04-16 DIAGNOSIS — Z713 Dietary counseling and surveillance: Secondary | ICD-10-CM | POA: Diagnosis not present

## 2019-04-16 NOTE — Progress Notes (Signed)
Cardiology Office Note:    Date:  04/16/2019   ID:  Jessica Lawson, DOB [birth year], MRN [id number]  PCP:  Lanae Boast, [place]  Cardiologist:  Elouise Munroe, MD  Electrophysiologist:  None   Referring MD: Lanae Boast, FNP   CC: follow up prior chest pain  History of Present Illness:    Jessica Lawson is a 53 y.o. female with a hx of  DM2 on oral therapy, hyperlipidemia, hypertension  who presents today for follow-up of chest discomfort. She follows with Dr. Margaretann Loveless for this.   Today: Works in [employer] at [employer], also in school for [employer]. High stress level but feels she is coping well with this. Has had no more chest pain. Breathing is ok, though she notices extra weight has made her breathing shorter. Had been walking a lot before coronavirus, has cut back on that. We discussed diet and exercise recommendations today, both for general cardiac care and weight loss.  Denies chest pain, shortness of breath at rest or with normal exertion. No PND, orthopnea, LE edema or unexpected weight gain. No syncope or palpitations.  Past Medical History:  Diagnosis Date  . Back pain   . Diabetes mellitus without complication ([place])   . High blood cholesterol   . Hypertension   . Kidney stone   . Normal cardiac stress test 02/2016   low risk  . Obesity   . Pain of cheek    and right side neck  . Tobacco use   . UTI (lower urinary tract infection)   . Wrist pain    left wrist    Past Surgical History:  Procedure Laterality Date  . CESAREAN SECTION     3 c-sections    Current Medications: Current Meds  Medication Sig  . ACCU-CHEK AVIVA PLUS test strip TEST BLOOD SUGAR AS DIRECTED UPTO FOUR TIMES DAILY  . ACCU-CHEK SOFTCLIX LANCETS lancets USE UP TO FOUR TIMES DAILY AS DIRECTED.  Marland Kitchen albuterol (PROVENTIL HFA;VENTOLIN HFA) 108 (90 Base) MCG/ACT inhaler Inhale 2 puffs into the lungs every 4 (four) hours as needed for wheezing or shortness of breath.  . blood glucose  meter kit and supplies KIT Dispense based on patient and insurance preference. Use up to four times daily as directed. (FOR ICD-9 250.00, 250.01).  . cetirizine (ZYRTEC) 10 MG tablet Take 1 tablet (10 mg total) by mouth daily.  . fenofibrate (TRICOR) 48 MG tablet Take 1 tablet (48 mg total) by mouth daily.  . fluticasone (FLONASE) 50 MCG/ACT nasal spray Place 2 sprays into both nostrils daily.  . hydrochlorothiazide (HYDRODIURIL) 12.5 MG tablet TAKE 1 TABLET(12.5 MG) BY MOUTH DAILY  . loratadine (CLARITIN) 10 MG tablet Take 1 tablet (10 mg total) by mouth daily.  . metFORMIN (GLUCOPHAGE) 500 MG tablet TAKE 1 TABLET(500 MG) BY MOUTH TWICE DAILY WITH A MEAL  . Multiple Vitamins-Minerals (MULTIVITAMIN WITH MINERALS) tablet Take 1 tablet by mouth daily.  Marland Kitchen omega-3 acid ethyl esters (LOVAZA) 1 g capsule TAKE 1 CAPSULE( 1 GRAM TOTAL) BY MOUTH DAILY  . rosuvastatin (CRESTOR) 10 MG tablet Take 1 tablet up to 3 times per week as tolerated     Allergies:   Asa [aspirin], Iohexol, Penicillins, Sulfa antibiotics, and Nystatin   Social History   Socioeconomic History  . Marital status: Single    Spouse name: Not on file  . Number of children: Not on file  . Years of education: Not on file  . Highest education level: Not on file  Occupational History  . Not on file  Social Needs  . Financial resource strain: Not on file  . Food insecurity    Worry: Not on file    Inability: Not on file  . Transportation needs    Medical: Not on file    Non-medical: Not on file  Tobacco Use  . Smoking status: Former Smoker    Packs/day: 1.00    Types: Cigarettes    Quit date: 07/26/2016    Years since quitting: 2.7  . Smokeless tobacco: Never Used  Substance and Sexual Activity  . Alcohol use: No  . Drug use: No  . Sexual activity: Not on file  Lifestyle  . Physical activity    Days per week: Not on file    Minutes per session: Not on file  . Stress: Not on file  Relationships  . Social Product manager on phone: Not on file    Gets together: Not on file    Attends religious service: Not on file    Active member of club or organization: Not on file    Attends meetings of clubs or organizations: Not on file    Relationship status: Not on file  Other Topics Concern  . Not on file  Social History Narrative  . Not on file     Family History: The patient's family history includes Diabetes in her mother, sister, sister, and another family member; Hyperlipidemia in an other family member; Hypertension in her sister and another family member; Lung disease in her father.  ROS:   Please see the history of present illness.    Constitutional: Negative for chills, fever, night sweats, unintentional weight loss  Respiratory: Negative for cough, sputum, wheezing.   Cardiovascular: See HPI. Gastrointestinal: Negative for abdominal pain, melena, and hematochezia.  Genitourinary: Negative for dysuria and hematuria.  Musculoskeletal: Negative for falls and myalgias.  Skin: Negative for itching and rash.  Neurological: Negative for focal weakness, focal sensory changes and loss of consciousness.  Endo/Heme/Allergies: Does not bruise/bleed easily.   EKGs/Labs/Other Studies Reviewed:    The following studies were reviewed today: Holter from 06/2018, echo from 02/2016  EKG:  ECG from 06/07/2018 personally reviewed, shows NSR  Recent Labs: 05/18/2018: Hemoglobin 14.3; Platelets 356 02/12/2019: ALT 29; BUN 16; Creatinine, Ser 0.84; Potassium 3.8; Sodium 143  Recent Lipid Panel    Component Value Date/Time   CHOL 190 02/12/2019 0844   TRIG 227 (H) 02/12/2019 0844   HDL 36 (L) 02/12/2019 0844   CHOLHDL 5.1 (H) 11/01/2018 1110   CHOLHDL 7.3 (H) 07/12/2017 0950   VLDL 50 (H) 04/06/2017 0902   LDLCALC 109 (H) 02/12/2019 0844   LDLCALC 183 (H) 07/12/2017 0950    Physical Exam:    VS:  BP 138/68   Pulse 61   Ht '4\' 11"'$  (1.499 m)   Wt 178 lb 6.4 oz (80.9 kg)   LMP 08/22/2011 Comment:  patient states LMP 20 years ago  SpO2 98%   BMI 36.03 kg/m     Wt Readings from Last 3 Encounters:  04/16/19 178 lb 6.4 oz (80.9 kg)  12/24/18 171 lb 1.6 oz (77.6 kg)  11/01/18 174 lb 3.2 oz (79 kg)    GEN: Well nourished, well developed in no acute distress HEENT: Normal, moist mucous membranes NECK: No JVD CARDIAC: regular rhythm, normal S1 and S2, no murmurs, rubs, gallops.  VASCULAR: Radial and DP pulses 2+ bilaterally. No carotid bruits RESPIRATORY:  Clear to auscultation without  rales, wheezing or rhonchi  ABDOMEN: Soft, non-tender, non-distended MUSCULOSKELETAL:  Ambulates independently SKIN: Warm and dry, no edema NEUROLOGIC:  Alert and oriented x 3. No focal neuro deficits noted. PSYCHIATRIC:  Normal affect   ASSESSMENT:    1. Hypercholesterolemia with hypertriglyceridemia   2. Essential hypertension   3. Cardiac risk counseling   4. Counseling on health promotion and disease prevention   5. Nutritional counseling   6. Exercise counseling    PLAN:    Hyperlipidemia: has had intolerance on other statins but doing well on rosuvastatin -wants to work on diet and exercise.  -last lipids 01/2019. LDL 109.  -on fenofibrate, omega 3 FA as well.  -with statin, lifestyle changes would recheck lipids next visit -LFTs 02/12/19 WNL  Essential hypertension- slightly elevated systolic today but typically well controlled -discussed diet, exercise -continue HCTZ  Chest pain: resolved -counseled to contact us if it recurs  CV risk factor counseling and prevention -recommend heart healthy/Mediterranean diet, with whole grains, fruits, vegetable, fish, lean meats, nuts, and olive oil. Limit salt. Counseled on making slow, gradual changes for weight loss. Avoid simple sugars and flour. Aim for healthy protein and unsaturated fats. -recommend moderate walking, 3-5 times/week for 30-50 minutes each session. Aim for at least 150 minutes.week. Goal should be pace of 3 miles/hours, or  walking 1.5 miles in 30 minutes. Discussed that this is the goal for heart health, but she will need more exercise than this most likely for weight loss. -recommend avoidance of tobacco products. Avoid excess alcohol. -ASCVD risk score: The 10-year ASCVD risk score Mikey Bussing DC Brooke Bonito., et al., 2013) is: 19.5%   Values used to calculate the score:     Age: 4 years     Sex: Female     Is Non-Hispanic African American: Yes     Diabetic: Yes     Tobacco smoker: No     Systolic Blood Pressure: 333 mmHg     Is BP treated: Yes     HDL Cholesterol: 36 mg/dL     Total Cholesterol: 190 mg/dL   Not discussed today: Snoring- Epworth Sleepiness Scale score of 9, she will reschedule her sleep test  TIME SPENT WITH PATIENT: 25 minutes of direct patient care. More than 50% of that time was spent on coordination of care and counseling regarding CV risk, nutrition, exercise, and weight loss counseling.  Buford Dresser, MD, PhD [place]  [facility]   Medication Adjustments/Labs and Tests Ordered: Current medicines are reviewed at length with the patient today.  Concerns regarding medicines are outlined above.  No orders of the defined types were placed in this encounter.  No orders of the defined types were placed in this encounter.   Patient Instructions  Medication Instructions:  Your Physician recommend you continue on your current medication as directed.    If you need a refill on your cardiac medications before your next appointment, please call your pharmacy.   Lab work: Non If you have labs (blood work) drawn today and your tests are completely normal, you will receive your results only by: Marland Kitchen MyChart Message (if you have MyChart) OR . A paper copy in the mail If you have any lab test that is abnormal or we need to change your treatment, we will call you to review the results.  Testing/Procedures: None  Follow-Up: At [facility], you and your health needs are our  priority.  As part of our continuing mission to provide you with exceptional heart care,  we have created designated Provider Care Teams.  These Care Teams include your primary Cardiologist (physician) and Advanced Practice Providers (APPs -  Physician Assistants and Nurse Practitioners) who all work together to provide you with the care you need, when you need it. You will need a follow up appointment in 1 years.  Please call our office 2 months in advance to schedule this appointment.  You may see Elouise Munroe, MD or one of the following Advanced Practice Providers on your designated Care Team:   Rosaria Ferries, PA-C . Jory Sims, DNP, ANP        Signed, Buford Dresser, MD  04/16/2019 6:36 PM    [place]

## 2019-04-16 NOTE — Patient Instructions (Signed)
Medication Instructions:  Your Physician recommend you continue on your current medication as directed.    If you need a refill on your cardiac medications before your next appointment, please call your pharmacy.   Lab work: Non If you have labs (blood work) drawn today and your tests are completely normal, you will receive your results only by: Marland Kitchen MyChart Message (if you have MyChart) OR . A paper copy in the mail If you have any lab test that is abnormal or we need to change your treatment, we will call you to review the results.  Testing/Procedures: None  Follow-Up: At [facility], you and your health needs are our priority.  As part of our continuing mission to provide you with exceptional heart care, we have created designated Provider Care Teams.  These Care Teams include your primary Cardiologist (physician) and Advanced Practice Providers (APPs -  Physician Assistants and Nurse Practitioners) who all work together to provide you with the care you need, when you need it. You will need a follow up appointment in 1 years.  Please call our office 2 months in advance to schedule this appointment.  You may see Elouise Munroe, MD or one of the following Advanced Practice Providers on your designated Care Team:   Rosaria Ferries, PA-C . Jory Sims, DNP, ANP

## 2019-04-17 NOTE — Addendum Note (Signed)
Addended by: Cristopher Estimable on: 04/17/2019 04:48 PM   Modules accepted: Orders

## 2019-04-24 ENCOUNTER — Encounter (HOSPITAL_COMMUNITY): Payer: Self-pay | Admitting: *Deleted

## 2019-04-24 ENCOUNTER — Encounter (HOSPITAL_COMMUNITY): Payer: Self-pay

## 2019-04-25 ENCOUNTER — Ambulatory Visit (AMBULATORY_SURGERY_CENTER): Payer: Self-pay | Admitting: *Deleted

## 2019-04-25 ENCOUNTER — Other Ambulatory Visit: Payer: Self-pay

## 2019-04-25 VITALS — Temp 96.6°F | Ht 59.0 in | Wt 179.0 lb

## 2019-04-25 DIAGNOSIS — R195 Other fecal abnormalities: Secondary | ICD-10-CM

## 2019-04-25 MED ORDER — SUPREP BOWEL PREP KIT 17.5-3.13-1.6 GM/177ML PO SOLN: 1.0000 | Freq: Once | ORAL | 0 refills | Status: AC

## 2019-04-25 NOTE — Progress Notes (Signed)
No egg or soy allergy known to patient  No issues with past sedation with any surgeries  or procedures, no intubation problems  No diet pills per patient No home 02 use per patient  No blood thinners per patient  Pt denies issues with constipation  No A fib or A flutter  EMMI video sent to pt's e mail  

## 2019-05-06 ENCOUNTER — Telehealth: Payer: Self-pay | Admitting: Gastroenterology

## 2019-05-06 ENCOUNTER — Telehealth: Payer: Self-pay

## 2019-05-06 NOTE — Telephone Encounter (Signed)
Patient states that she is having a hard time this morning being on clear liquids. States her blood sugar has already dropped d/t being a diabetic. Would like for someone to call her back.

## 2019-05-06 NOTE — Telephone Encounter (Signed)
Instructed pt. To consume things that has sugar in it and keep a check on blood sugar throughout day and if her blood sugar is still staying low she needs to continue to check blood sugar and consume things with sugar in it,she verbalize understanding.

## 2019-05-06 NOTE — Telephone Encounter (Signed)
Covid-19 screening questions   Do you now or have you had a fever in the last 14 days? NO   Do you have any respiratory symptoms of shortness of breath or cough now or in the last 14 days? NO   Do you have any family members or close contacts with diagnosed or suspected Covid-19 in the past 14 days? NO, but patient states she works at the [employer] and is required to wear PPE.   Have you been tested for Covid-19 and found to be positive? NO

## 2019-05-07 ENCOUNTER — Ambulatory Visit (AMBULATORY_SURGERY_CENTER): Payer: Medicaid Other | Admitting: Gastroenterology

## 2019-05-07 ENCOUNTER — Encounter: Payer: Self-pay | Admitting: Gastroenterology

## 2019-05-07 ENCOUNTER — Other Ambulatory Visit: Payer: Self-pay

## 2019-05-07 ENCOUNTER — Other Ambulatory Visit: Payer: Self-pay | Admitting: Gastroenterology

## 2019-05-07 VITALS — BP 127/73 | HR 68 | Temp 98.3°F | Resp 15 | Ht 59.0 in | Wt 179.0 lb

## 2019-05-07 DIAGNOSIS — D123 Benign neoplasm of transverse colon: Secondary | ICD-10-CM | POA: Diagnosis not present

## 2019-05-07 DIAGNOSIS — K635 Polyp of colon: Secondary | ICD-10-CM | POA: Diagnosis not present

## 2019-05-07 DIAGNOSIS — I10 Essential (primary) hypertension: Secondary | ICD-10-CM | POA: Diagnosis not present

## 2019-05-07 DIAGNOSIS — D125 Benign neoplasm of sigmoid colon: Secondary | ICD-10-CM

## 2019-05-07 DIAGNOSIS — E119 Type 2 diabetes mellitus without complications: Secondary | ICD-10-CM | POA: Diagnosis not present

## 2019-05-07 DIAGNOSIS — R195 Other fecal abnormalities: Secondary | ICD-10-CM

## 2019-05-07 DIAGNOSIS — D127 Benign neoplasm of rectosigmoid junction: Secondary | ICD-10-CM | POA: Diagnosis not present

## 2019-05-07 DIAGNOSIS — D128 Benign neoplasm of rectum: Secondary | ICD-10-CM

## 2019-05-07 DIAGNOSIS — K621 Rectal polyp: Secondary | ICD-10-CM | POA: Diagnosis not present

## 2019-05-07 MED ORDER — SODIUM CHLORIDE 0.9 % IV SOLN: 500.0000 mL | Freq: Once | INTRAVENOUS | Status: DC

## 2019-05-07 NOTE — Progress Notes (Signed)
Report given to PACU, vss 

## 2019-05-07 NOTE — Patient Instructions (Signed)
Please read handouts provided. Continue present medications. Await pathology results.        YOU HAD AN ENDOSCOPIC PROCEDURE TODAY AT THE [facility]:   Refer to the procedure report that was given to you for any specific questions about what was found during the examination.  If the procedure report does not answer your questions, please call your gastroenterologist to clarify.  If you requested that your care partner not be given the details of your procedure findings, then the procedure report has been included in a sealed envelope for you to review at your convenience later.  YOU SHOULD EXPECT: Some feelings of bloating in the abdomen. Passage of more gas than usual.  Walking can help get rid of the air that was put into your GI tract during the procedure and reduce the bloating. If you had a lower endoscopy (such as a colonoscopy or flexible sigmoidoscopy) you may notice spotting of blood in your stool or on the toilet paper. If you underwent a bowel prep for your procedure, you may not have a normal bowel movement for a few days.  Please Note:  You might notice some irritation and congestion in your nose or some drainage.  This is from the oxygen used during your procedure.  There is no need for concern and it should clear up in a day or so.  SYMPTOMS TO REPORT IMMEDIATELY:   Following lower endoscopy (colonoscopy or flexible sigmoidoscopy):  Excessive amounts of blood in the stool  Significant tenderness or worsening of abdominal pains  Swelling of the abdomen that is new, acute  Fever of 100F or higher    For urgent or emergent issues, a gastroenterologist can be reached at any hour by calling [phone].   DIET:  We do recommend a small meal at first, but then you may proceed to your regular diet.  Drink plenty of fluids but you should avoid alcoholic beverages for 24 hours.  ACTIVITY:  You should plan to take it easy for the rest of today and you should NOT  DRIVE or use heavy machinery until tomorrow (because of the sedation medicines used during the test).    FOLLOW UP: Our staff will call the number listed on your records 48-72 hours following your procedure to check on you and address any questions or concerns that you may have regarding the information given to you following your procedure. If we do not reach you, we will leave a message.  We will attempt to reach you two times.  During this call, we will ask if you have developed any symptoms of COVID 19. If you develop any symptoms (ie: fever, flu-like symptoms, shortness of breath, cough etc.) before then, please call [phone].  If you test positive for Covid 19 in the 2 weeks post procedure, please call and report this information to us.    If any biopsies were taken you will be contacted by phone or by letter within the next 1-3 weeks.  Please call us at [phone] if you have not heard about the biopsies in 3 weeks.    SIGNATURES/CONFIDENTIALITY: You and/or your care partner have signed paperwork which will be entered into your electronic medical record.  These signatures attest to the fact that that the information above on your After Visit Summary has been reviewed and is understood.  Full responsibility of the confidentiality of this discharge information lies with you and/or your care-partner. 

## 2019-05-07 NOTE — Progress Notes (Signed)
Pt's states no medical or surgical changes since previsit or office visit. 

## 2019-05-07 NOTE — Progress Notes (Signed)
Called to room to assist during endoscopic procedure.  Patient ID and intended procedure confirmed with present staff. Received instructions for my participation in the procedure from the performing physician.  

## 2019-05-07 NOTE — Op Note (Addendum)
[place] Patient Name: Jessica Lawson Procedure Date: 05/07/2019 8:44 AM MRN: [id number] Endoscopist: Mauri Pole , MD Age: 53 Referring MD:  Date of Birth: 1965/09/07 Gender: Female Account #: [id number] Procedure:                Colonoscopy Indications:              Positive Cologuard test Medicines:                Monitored Anesthesia Care Procedure:                Pre-Anesthesia Assessment:                           - Prior to the procedure, a History and Physical                            was performed, and patient medications and                            allergies were reviewed. The patient's tolerance of                            previous anesthesia was also reviewed. The risks                            and benefits of the procedure and the sedation                            options and risks were discussed with the patient.                            All questions were answered, and informed consent                            was obtained. Prior Anticoagulants: The patient has                            taken no previous anticoagulant or antiplatelet                            agents. ASA Grade Assessment: III - A patient with                            severe systemic disease. After reviewing the risks                            and benefits, the patient was deemed in                            satisfactory condition to undergo the procedure.                           After obtaining informed consent, the colonoscope  was passed under direct vision. Throughout the                            procedure, the patient's blood pressure, pulse, and                            oxygen saturations were monitored continuously. The                            Colonoscope was introduced through the anus and                            advanced to the the cecum, identified by                            appendiceal orifice and ileocecal  valve. The                            colonoscopy was performed without difficulty. The                            patient tolerated the procedure well. The quality                            of the bowel preparation was good. The ileocecal                            valve, appendiceal orifice, and rectum were                            photographed. Scope In: 8:51:17 AM Scope Out: 9:10:29 AM Scope Withdrawal Time: 0 hours 16 minutes 32 seconds  Total Procedure Duration: 0 hours 19 minutes 12 seconds  Findings:                 The perianal and digital rectal examinations were                            normal.                           Two sessile polyps were found in the transverse                            colon. The polyps were 1 to 2 mm in size. These                            polyps were removed with a cold biopsy forceps.                            Resection and retrieval were complete.                           Six sessile polyps were found in the rectum,  recto-sigmoid colon and sigmoid colon. The polyps                            were 4 to 7 mm in size. These polyps were removed                            with a cold snare. Resection and retrieval were                            complete.                           Multiple small-mouthed diverticula were found in                            the sigmoid colon and descending colon.                           Non-bleeding internal hemorrhoids were found during                            retroflexion. The hemorrhoids were small. Complications:            No immediate complications. Estimated Blood Loss:     Estimated blood loss was minimal. Impression:               - Two 1 to 2 mm polyps in the transverse colon,                            removed with a cold biopsy forceps. Resected and                            retrieved.                           - Six 4 to 7 mm polyps in the rectum, at the                             recto-sigmoid colon and in the sigmoid colon,                            removed with a cold snare. Resected and retrieved.                           - Diverticulosis in the sigmoid colon and in the                            descending colon.                           - Non-bleeding internal hemorrhoids. Recommendation:           - Patient has a contact number available for  emergencies. The signs and symptoms of potential                            delayed complications were discussed with the                            patient. Return to normal activities tomorrow.                            Written discharge instructions were provided to the                            patient.                           - Resume previous diet.                           - Continue present medications.                           - Await pathology results.                           - Repeat colonoscopy in 3 - 5 years for                            surveillance based on pathology results. Mauri Pole, MD 05/07/2019 9:15:56 AM This report has been signed electronically.

## 2019-05-09 ENCOUNTER — Telehealth: Payer: Self-pay | Admitting: *Deleted

## 2019-05-09 NOTE — Telephone Encounter (Signed)
  Follow up Call-  Call back number 05/07/2019  Post procedure Call Back phone  # [phone]  Permission to leave phone message Yes  Some recent data might be hidden     Patient questions:  Do you have a fever, pain , or abdominal swelling? No. Pain Score  0 *  Have you tolerated food without any problems? Yes.    Have you been able to return to your normal activities? Yes.    Do you have any questions about your discharge instructions: Diet   No. Medications  No. Follow up visit  No.  Do you have questions or concerns about your Care? No.  Actions: * If pain score is 4 or above: No action needed, pain <4.  1. Have you developed a fever since your procedure? no  2.   Have you had an respiratory symptoms (SOB or cough) since your procedure? no  3.   Have you tested positive for COVID 19 since your procedure no  4.   Have you had any family members/close contacts diagnosed with the COVID 19 since your procedure?  no   If yes to any of these questions please route to Joylene John, RN and Alphonsa Gin, Therapist, sports.

## 2019-05-13 ENCOUNTER — Ambulatory Visit: Payer: Medicaid Other | Admitting: Family Medicine

## 2019-05-16 ENCOUNTER — Other Ambulatory Visit: Payer: Self-pay | Admitting: Family Medicine

## 2019-05-17 ENCOUNTER — Encounter: Payer: Self-pay | Admitting: Gastroenterology

## 2019-05-22 ENCOUNTER — Other Ambulatory Visit: Payer: Self-pay

## 2019-05-22 DIAGNOSIS — E785 Hyperlipidemia, unspecified: Secondary | ICD-10-CM

## 2019-05-22 MED ORDER — FENOFIBRATE 48 MG PO TABS: 48.0000 mg | ORAL_TABLET | Freq: Every day | ORAL | 5 refills | Status: DC

## 2019-06-04 ENCOUNTER — Encounter: Payer: Self-pay | Admitting: Family Medicine

## 2019-06-04 ENCOUNTER — Other Ambulatory Visit: Payer: Self-pay

## 2019-06-04 ENCOUNTER — Ambulatory Visit (INDEPENDENT_AMBULATORY_CARE_PROVIDER_SITE_OTHER): Payer: Medicaid Other | Admitting: Family Medicine

## 2019-06-04 VITALS — BP 141/81 | HR 88 | Temp 98.0°F | Ht 59.0 in | Wt 181.0 lb

## 2019-06-04 DIAGNOSIS — E119 Type 2 diabetes mellitus without complications: Secondary | ICD-10-CM

## 2019-06-04 DIAGNOSIS — E669 Obesity, unspecified: Secondary | ICD-10-CM

## 2019-06-04 DIAGNOSIS — E782 Mixed hyperlipidemia: Secondary | ICD-10-CM

## 2019-06-04 DIAGNOSIS — R110 Nausea: Secondary | ICD-10-CM

## 2019-06-04 DIAGNOSIS — I10 Essential (primary) hypertension: Secondary | ICD-10-CM

## 2019-06-04 LAB — POCT GLYCOSYLATED HEMOGLOBIN (HGB A1C): Hemoglobin A1C: 6.2 % — AB (ref 4.0–5.6)

## 2019-06-04 LAB — POCT URINALYSIS DIPSTICK
Bilirubin, UA: NEGATIVE
Blood, UA: NEGATIVE
Glucose, UA: NEGATIVE
Ketones, UA: NEGATIVE
Leukocytes, UA: NEGATIVE
Nitrite, UA: NEGATIVE
Protein, UA: NEGATIVE
Spec Grav, UA: >=1.03 — AB (ref 1.010–1.025)
Urobilinogen, UA: 0.2 E.U./dL
pH, UA: 5.5 (ref 5.0–8.0)

## 2019-06-04 LAB — GLUCOSE, POCT (MANUAL RESULT ENTRY): POC Glucose: 150 mg/dl — AB (ref 70–99)

## 2019-06-04 MED ORDER — ONDANSETRON HCL 4 MG PO TABS: 4.0000 mg | ORAL_TABLET | Freq: Three times a day (TID) | ORAL | 1 refills | Status: DC | PRN

## 2019-06-04 MED ORDER — HYDROCHLOROTHIAZIDE 25 MG PO TABS: 25.0000 mg | ORAL_TABLET | Freq: Every day | ORAL | 1 refills | Status: DC

## 2019-06-04 NOTE — Patient Instructions (Signed)
Increase hydrochlorothiazide to 25 mg per day  Continue medication, monitor blood pressure at home. Continue DASH diet. Reminder to go to the ER if any CP, SOB, nausea, dizziness, severe HA, changes vision/speech, left arm numbness and tingling and jaw pain.

## 2019-06-04 NOTE — Progress Notes (Signed)
Established Patient Office Visit  Subjective:  Patient ID: Jessica Lawson, female    DOB: 1966/05/01  Age: 53 y.o. MRN: [id number]  CC:  Chief Complaint  Patient presents with  . Follow-up    htn, dm     HPI Fay Bagg, a very pleasant 53 year old female with a medical history significant for type 2 diabetes mellitus, hyperlipidemia, and hypertension presents for a 10-monthfollow-up of chronic conditions.  Patient states that she has no new complaints and has been following medication regimen consistently without interruption.  She states that she has not been exercising, but works a very physical job in [employer]  She also says that she attempts to follow a low-fat diet most of the time.  She says that she has been drinking sodas and eating cookies, cakes, and pans. She denies polyuria, polydipsia, or polyphagia.  Patient also denies headache, dizziness, paresthesias, lower extremity swelling, heart palpitations, or chest pain. Dmya's cardiac risk factors include obesity, hypertension, former smoker, and hyperlipidemia.  She says that she checks blood pressures at home and BP has been consistently elevated above 140/90.  She says that she has been on hydrochlorothiazide 12.5 mg over the past several years.  Patient is also followed by cardiology every 6 months.  Past Medical History:  Diagnosis Date  . Allergy   . Back pain   . [facility] [place] without complication [place]   . High blood cholesterol   . Hypertension   . Kidney stone   . Normal cardiac stress test 02/2016   low risk  . Obesity   . Pain of cheek    and right side neck  . Tobacco use   . UTI (lower urinary tract infection)   . Wrist pain    left wrist    Past Surgical History:  Procedure Laterality Date  . CESAREAN SECTION     3 c-sections  . DENTAL SURGERY     teeth removed with meds to calm     Family History  Problem Relation Age of Onset  . Hyperlipidemia Other   . Hypertension  Other   . Diabetes Other   . Diabetes Mother   . Lung disease Father   . Diabetes Sister   . Diabetes Sister   . Hypertension Sister   . Colon cancer Neg Hx   . Colon polyps Neg Hx   . Esophageal cancer Neg Hx   . Stomach cancer Neg Hx   . Rectal cancer Neg Hx     Social History   Socioeconomic History  . Marital status: Single    Spouse name: Not on file  . Number of children: Not on file  . Years of education: Not on file  . Highest education level: Not on file  Occupational History  . Not on file  Social Needs  . Financial resource strain: Not on file  . Food insecurity    Worry: Not on file    Inability: Not on file  . Transportation needs    Medical: Not on file    Non-medical: Not on file  Tobacco Use  . Smoking status: Former Smoker    Packs/day: 1.00    Types: Cigarettes    Quit date: 07/26/2016    Years since quitting: 2.8  . Smokeless tobacco: Never Used  Substance and Sexual Activity  . Alcohol use: No  . Drug use: No  . Sexual activity: Not on file  Lifestyle  . Physical activity    Days per  week: Not on file    Minutes per session: Not on file  . Stress: Not on file  Relationships  . Social Herbalist on phone: Not on file    Gets together: Not on file    Attends religious service: Not on file    Active member of club or organization: Not on file    Attends meetings of clubs or organizations: Not on file    Relationship status: Not on file  . Intimate partner violence    Fear of current or ex partner: Not on file    Emotionally abused: Not on file    Physically abused: Not on file    Forced sexual activity: Not on file  Other Topics Concern  . Not on file  Social History Narrative  . Not on file    Outpatient Medications Prior to Visit  Medication Sig Dispense Refill  . ACCU-CHEK AVIVA PLUS test strip TEST BLOOD SUGAR AS DIRECTED UPTO FOUR TIMES DAILY 100 strip 3  . ACCU-CHEK SOFTCLIX LANCETS lancets USE UP TO FOUR TIMES  DAILY AS DIRECTED. 100 each 0  . albuterol (PROVENTIL HFA;VENTOLIN HFA) 108 (90 Base) MCG/ACT inhaler Inhale 2 puffs into the lungs every 4 (four) hours as needed for wheezing or shortness of breath. 1 Inhaler 2  . blood glucose meter kit and supplies KIT Dispense based on patient and insurance preference. Use up to four times daily as directed. (FOR ICD-9 250.00, 250.01). 1 each 0  . cetirizine (ZYRTEC) 10 MG tablet Take 1 tablet (10 mg total) by mouth daily. 30 tablet 11  . fenofibrate (TRICOR) 48 MG tablet Take 1 tablet (48 mg total) by mouth daily. 30 tablet 5  . fluticasone (FLONASE) 50 MCG/ACT nasal spray Place 2 sprays into both nostrils daily. 16 g 6  . loratadine (CLARITIN) 10 MG tablet Take 1 tablet (10 mg total) by mouth daily. 30 tablet 11  . metFORMIN (GLUCOPHAGE) 500 MG tablet TAKE 1 TABLET(500 MG) BY MOUTH TWICE DAILY WITH A MEAL (Patient taking differently: daily with breakfast. ) 180 tablet 1  . Multiple Vitamins-Minerals (MULTIVITAMIN WITH MINERALS) tablet Take 1 tablet by mouth daily.    Marland Kitchen omega-3 acid ethyl esters (LOVAZA) 1 g capsule TAKE 1 CAPSULE( 1 GRAM TOTAL) BY MOUTH DAILY 90 capsule 1  . rosuvastatin (CRESTOR) 10 MG tablet Take 1 tablet up to 3 times per week as tolerated 30 tablet 3  . hydrochlorothiazide (HYDRODIURIL) 12.5 MG tablet TAKE 1 TABLET(12.5 MG) BY MOUTH DAILY 90 tablet 1   No facility-administered medications prior to visit.     Allergies  Allergen Reactions  . Asa [Aspirin] Anaphylaxis, Hives and Swelling  . Iohexol Other (See Comments)     Desc: pt complains of difficulty swallowing and thickened tongue/ throat closes up   . Penicillins Hives, Swelling and Other (See Comments)    Thrush   . Sulfa Antibiotics Anaphylaxis, Hives, Swelling and Other (See Comments)  . Nystatin Other (See Comments)    thrush    ROS Review of Systems  Constitutional: Negative for fatigue.  HENT: Negative.   Eyes: Negative.   Respiratory: Negative.  Negative for  shortness of breath.   Cardiovascular: Negative for chest pain and leg swelling.  Endocrine: Negative.   Genitourinary: Negative.   Musculoskeletal: Negative.   Skin: Negative.   Allergic/Immunologic: Negative.   Neurological: Negative.   Hematological: Negative.   Psychiatric/Behavioral: Negative.       Objective:    Physical Exam  Constitutional: She is oriented to person, place, and time. She appears well-developed and well-nourished.  HENT:  Head: Normocephalic.  Eyes: Pupils are equal, round, and reactive to light.  Neck: Normal range of motion.  Cardiovascular: Normal rate and regular rhythm.  Pulmonary/Chest: Effort normal and breath sounds normal.  Abdominal: Soft. Bowel sounds are normal. She exhibits no distension. There is no abdominal tenderness.  Musculoskeletal: Normal range of motion.  Neurological: She is alert and oriented to person, place, and time. She has normal reflexes.  Skin: Skin is warm and dry.  Psychiatric: She has a normal mood and affect. Her behavior is normal. Judgment and thought content normal.    BP (!) 141/81   Pulse 88   Temp 98 F (36.7 C) (Oral)   Ht '4\' 11"'$  (1.499 m)   Wt 181 lb (82.1 kg)   LMP 08/22/2011 Comment: patient states LMP 20 years ago  SpO2 98%   BMI 36.56 kg/m  Wt Readings from Last 3 Encounters:  06/04/19 181 lb (82.1 kg)  05/07/19 179 lb (81.2 kg)  04/25/19 179 lb (81.2 kg)     Health Maintenance Due  Topic Date Due  . PAP SMEAR-Modifier  10/30/2015  . FOOT EXAM  10/28/2018  . OPHTHALMOLOGY EXAM  03/30/2019  . URINE MICROALBUMIN  05/03/2019    There are no preventive care reminders to display for this patient.  Lab Results  Component Value Date   TSH 0.793 10/27/2017   Lab Results  Component Value Date   WBC 6.5 05/18/2018   HGB 14.3 05/18/2018   HCT 42.5 05/18/2018   MCV 83.7 05/18/2018   PLT 356 05/18/2018   Lab Results  Component Value Date   NA 143 02/12/2019   K 3.8 02/12/2019   CO2 23  02/12/2019   GLUCOSE 122 (H) 02/12/2019   BUN 16 02/12/2019   CREATININE 0.84 02/12/2019   BILITOT 0.3 02/12/2019   ALKPHOS 48 02/12/2019   AST 18 02/12/2019   ALT 29 02/12/2019   PROT 6.9 02/12/2019   ALBUMIN 4.4 02/12/2019   CALCIUM 9.7 02/12/2019   ANIONGAP 11 05/18/2018   Lab Results  Component Value Date   CHOL 190 02/12/2019   Lab Results  Component Value Date   HDL 36 (L) 02/12/2019   Lab Results  Component Value Date   LDLCALC 109 (H) 02/12/2019   Lab Results  Component Value Date   TRIG 227 (H) 02/12/2019   Lab Results  Component Value Date   CHOLHDL 5.1 (H) 11/01/2018   Lab Results  Component Value Date   HGBA1C 6.2 (A) 06/04/2019      Assessment & Plan:   Problem List Items Addressed This Visit      Cardiovascular and Mediastinum   Essential hypertension   Relevant Medications   hydrochlorothiazide (HYDRODIURIL) 25 MG tablet     Other   Obesity (BMI 30-39.9)   Hyperlipidemia   Relevant Medications   hydrochlorothiazide (HYDRODIURIL) 25 MG tablet    Other Visit Diagnoses    Type 2 diabetes mellitus without complication, without long-term current use of insulin (HCC)    -  Primary   Relevant Orders   HgB A1c (Completed)   POCT urinalysis dipstick   POCT glucose (manual entry) (Completed)   CMP and Liver   Nausea       Relevant Medications   ondansetron (ZOFRAN) 4 MG tablet      Meds ordered this encounter  Medications  . ondansetron (ZOFRAN) 4 MG tablet  Sig: Take 1 tablet (4 mg total) by mouth every 8 (eight) hours as needed for nausea or vomiting.    Dispense:  30 tablet    Refill:  1    Order Specific Question:   Supervising Provider    Answer:   Tresa Garter [id number]  . hydrochlorothiazide (HYDRODIURIL) 25 MG tablet    Sig: Take 1 tablet (25 mg total) by mouth daily.    Dispense:  90 tablet    Refill:  1    Order Specific Question:   Supervising Provider    Answer:   Tresa Garter [id number]   Type 2  diabetes mellitus without complication, without long-term current use of insulin ([place]) On today, patient's hemoglobin A1c has improved to 6.2.  No medication changes warranted.  We will continue Metformin at current dosage. Recommend a lowfat, low carbohydrate diet divided over 5-6 small meals, increase water intake to 6-8 glasses, and 150 minutes per week of cardiovascular exercise.   Take your medications as prescribed Make sure that you are familiar with each one of your medications and what they are used to treat.  If you are unsure of medications, please bring to follow up Will send referral for eye exam  Please keep your scheduled follow up appointment.   - HgB A1c - POCT urinalysis dipstick - POCT glucose (manual entry) - CMP and Liver   Nausea - ondansetron (ZOFRAN) 4 MG tablet; Take 1 tablet (4 mg total) by mouth every 8 (eight) hours as needed for nausea or vomiting.  Dispense: 30 tablet; Refill: 1  Essential hypertension Blood pressure has been elevated on hydrochlorothiazide 12.5, will increase dose to 25 mg daily.  Patient will return in 1 week for blood pressure check.  Patient also advised to continue checking blood pressures at home.  Continue DASH diet. Reminder to go to the ER if any CP, SOB, nausea, dizziness, severe HA, changes vision/speech, left arm numbness and tingling and jaw pain.  - hydrochlorothiazide (HYDRODIURIL) 25 MG tablet; Take 1 tablet (25 mg total) by mouth daily.  Dispense: 90 tablet; Refill: 1   Mixed hyperlipidemia The 10-year ASCVD risk score [name], et al., 2013) is: 20.9%   Values used to calculate the score:     Age: 35 years     Sex: Female     Is Non-Hispanic African American: Yes     Diabetic: Yes     Tobacco smoker: No     Systolic Blood Pressure: 378 mmHg     Is BP treated: Yes     HDL Cholesterol: 36 mg/dL     Total Cholesterol: 190 mg/dL Patient will continue statin therapy at current dose.  Also advised to add 150 minutes  of exercise per week to assist with this problem.  Discussed low-fat, carbohydrate modified diet at length.  Obesity (BMI 30-39.9) The patient is asked to make an attempt to improve diet and exercise patterns to aid in medical management of this problem.   Health maintenance: Patient is up-to-date with mammogram. Reviewed colonoscopy results, will need to repeat colonoscopy in 5 years. Patient up-to-date with Pap smear  Follow-up: 3 months for chronic conditions    Lachina Al Decant  APRN, MSN, FNP-C [facility] [address] [place], [place] [address] [phone]

## 2019-06-05 LAB — CMP AND LIVER
ALT: 32 IU/L (ref 0–32)
AST: 20 IU/L (ref 0–40)
Albumin: 4.5 g/dL (ref 3.8–4.9)
Alkaline Phosphatase: 59 IU/L (ref 39–117)
BUN: 14 mg/dL (ref 6–24)
Bilirubin Total: 0.3 mg/dL (ref 0.0–1.2)
Bilirubin, Direct: 0.1 mg/dL (ref 0.00–0.40)
CO2: 25 mmol/L (ref 20–29)
Calcium: 9.8 mg/dL (ref 8.7–10.2)
Chloride: 103 mmol/L (ref 96–106)
Creatinine, Ser: 0.98 mg/dL (ref 0.57–1.00)
GFR calc Af Amer: 76 mL/min/{1.73_m2} (ref >59)
GFR calc non Af Amer: 66 mL/min/{1.73_m2} (ref >59)
Glucose: 111 mg/dL — ABNORMAL HIGH (ref 65–99)
Potassium: 3.8 mmol/L (ref 3.5–5.2)
Sodium: 141 mmol/L (ref 134–144)
Total Protein: 7.6 g/dL (ref 6.0–8.5)

## 2019-06-11 ENCOUNTER — Other Ambulatory Visit: Payer: Self-pay

## 2019-06-12 ENCOUNTER — Ambulatory Visit: Payer: Medicaid Other

## 2019-06-12 VITALS — BP 145/69 | HR 88

## 2019-06-12 DIAGNOSIS — I10 Essential (primary) hypertension: Secondary | ICD-10-CM

## 2019-06-12 NOTE — Progress Notes (Signed)
Patient arrived at 9:45 am for a repeat blood pressure check.  Per patient blood pressure medication was taken at 8:30 am today. Today reading at 10 AM was  left arm 140/57, pulse 88. Then right arm 145/69.  (No complaints).

## 2019-06-22 ENCOUNTER — Other Ambulatory Visit: Payer: Self-pay | Admitting: Internal Medicine

## 2019-07-10 ENCOUNTER — Other Ambulatory Visit: Payer: Self-pay

## 2019-07-10 DIAGNOSIS — B009 Herpesviral infection, unspecified: Secondary | ICD-10-CM

## 2019-07-10 MED ORDER — VALACYCLOVIR HCL 1 G PO TABS: 1000.0000 mg | ORAL_TABLET | Freq: Two times a day (BID) | ORAL | 0 refills | Status: DC

## 2019-07-14 ENCOUNTER — Other Ambulatory Visit: Payer: Self-pay | Admitting: Internal Medicine

## 2019-07-16 ENCOUNTER — Other Ambulatory Visit: Payer: Self-pay | Admitting: Internal Medicine

## 2019-07-16 MED ORDER — ROSUVASTATIN CALCIUM 10 MG PO TABS: ORAL_TABLET | ORAL | 3 refills | Status: DC

## 2019-08-10 ENCOUNTER — Other Ambulatory Visit: Payer: Self-pay | Admitting: Family Medicine

## 2019-08-10 DIAGNOSIS — J0100 Acute maxillary sinusitis, unspecified: Secondary | ICD-10-CM

## 2019-09-04 ENCOUNTER — Ambulatory Visit: Payer: Medicaid Other | Admitting: Family Medicine

## 2019-09-17 ENCOUNTER — Ambulatory Visit (INDEPENDENT_AMBULATORY_CARE_PROVIDER_SITE_OTHER): Payer: Medicaid Other | Admitting: Family Medicine

## 2019-09-17 ENCOUNTER — Encounter: Payer: Self-pay | Admitting: Family Medicine

## 2019-09-17 ENCOUNTER — Other Ambulatory Visit: Payer: Self-pay

## 2019-09-17 VITALS — BP 142/86 | HR 79 | Ht 59.0 in | Wt 177.2 lb

## 2019-09-17 DIAGNOSIS — E119 Type 2 diabetes mellitus without complications: Secondary | ICD-10-CM | POA: Diagnosis not present

## 2019-09-17 DIAGNOSIS — S66912A Strain of unspecified muscle, fascia and tendon at wrist and hand level, left hand, initial encounter: Secondary | ICD-10-CM | POA: Diagnosis not present

## 2019-09-17 DIAGNOSIS — R739 Hyperglycemia, unspecified: Secondary | ICD-10-CM

## 2019-09-17 DIAGNOSIS — R7303 Prediabetes: Secondary | ICD-10-CM | POA: Diagnosis not present

## 2019-09-17 DIAGNOSIS — Z09 Encounter for follow-up examination after completed treatment for conditions other than malignant neoplasm: Secondary | ICD-10-CM | POA: Diagnosis not present

## 2019-09-17 DIAGNOSIS — I10 Essential (primary) hypertension: Secondary | ICD-10-CM

## 2019-09-17 LAB — POCT URINALYSIS DIPSTICK
Bilirubin, UA: NEGATIVE
Glucose, UA: NEGATIVE
Ketones, UA: NEGATIVE
Nitrite, UA: NEGATIVE
Protein, UA: NEGATIVE
Spec Grav, UA: >=1.03 — AB (ref 1.010–1.025)
Urobilinogen, UA: 1 E.U./dL
pH, UA: 5.5 (ref 5.0–8.0)

## 2019-09-17 LAB — GLUCOSE, POCT (MANUAL RESULT ENTRY): POC Glucose: 130 mg/dl — AB (ref 70–99)

## 2019-09-17 LAB — POCT GLYCOSYLATED HEMOGLOBIN (HGB A1C): Hemoglobin A1C: 6.3 % — AB (ref 4.0–5.6)

## 2019-09-17 MED ORDER — METHOCARBAMOL 500 MG PO TABS: 500.0000 mg | ORAL_TABLET | Freq: Two times a day (BID) | ORAL | 2 refills | Status: DC | PRN

## 2019-09-17 NOTE — Patient Instructions (Signed)
RICE Therapy for Routine Care of Injuries Many injuries can be cared for with rest, ice, compression, and elevation (RICE therapy). This includes:  Resting the injured part.  Putting ice on the injury.  Putting pressure (compression) on the injury.  Raising the injured part (elevation). Using RICE therapy can help to lessen pain and swelling. Supplies needed:  Ice.  Plastic bag.  Towel.  Elastic bandage.  Pillow or pillows to raise (elevate) your injured body part. How to care for your injury with RICE therapy Rest Limit your normal activities, and try not to use the injured part of your body. You can go back to your normal activities when your doctor says it is okay to do them and you feel okay. Ask your doctor if you should do exercises to help your injury get better. Ice Put ice on the injured area. Do not put ice on your bare skin.  Put ice in a plastic bag.  Place a towel between your skin and the bag.  Leave the ice on for 20 minutes, 2-3 times a day. Use ice on as many days as told by your doctor.  Compression Compression means putting pressure on the injured area. This can be done with an elastic bandage. If an elastic bandage has been put on your injury:  Do not wrap the bandage too tight. Wrap the bandage more loosely if part of your body away from the bandage is blue, swollen, cold, painful, or loses feeling (gets numb).  Take off the bandage and put it on again. Do this every 3-4 hours or as told by your doctor.  See your doctor if the bandage seems to make your problems worse.  Elevation Elevation means keeping the injured area raised. If you can, raise the injured area above your heart or the center of your chest. Contact a doctor if:  You keep having pain and swelling.  Your symptoms get worse. Get help right away if:  You have sudden bad pain at your injury or lower than your injury.  You have redness or more swelling around your injury.  You  have tingling or numbness at your injury or lower than your injury, and it does not go away when you take off the bandage. Summary  Many injuries can be cared for using rest, ice, compression, and elevation (RICE therapy).  You can go back to your normal activities when you feel okay and your doctor says it is okay.  Put ice on the injured area as told by your doctor.  Get help if your symptoms get worse or if you keep having pain and swelling. This information is not intended to replace advice given to you by your health care provider. Make sure you discuss any questions you have with your health care provider. Document Revised: 04/21/2017 Document Reviewed: 04/21/2017 Elsevier Patient Education  [place] tablets What is this medicine? METHOCARBAMOL (meth oh KAR ba mole) helps to relieve pain and stiffness in muscles caused by strains, sprains, or other injury to your muscles. This medicine may be used for other purposes; ask your health care provider or pharmacist if you have questions. COMMON BRAND NAME(S): Robaxin What should I tell my health care provider before I take this medicine? They need to know if you have any of these conditions:  kidney disease  seizures  an unusual or allergic reaction to methocarbamol, other medicines, foods, dyes, or preservatives  pregnant or trying to get pregnant  breast-feeding How should  I use this medicine? Take this medicine by mouth with a full glass of water. Follow the directions on the prescription label. Take your medicine at regular intervals. Do not take your medicine more often than directed. Talk to your pediatrician regarding the use of this medicine in children. Special care may be needed. Overdosage: If you think you have taken too much of this medicine contact a poison control center or emergency room at once. NOTE: This medicine is only for you. Do not share this medicine with others. What if I miss a  dose? If you miss a dose, take it as soon as you can. If it is almost time for your next dose, take only the next dose. Do not take double or extra doses. What may interact with this medicine? Do not take this medication with any of the following medicines:  narcotic medicines for cough This medicine may also interact with the following medications:  alcohol  antihistamines for allergy, cough and cold  certain medicines for anxiety or sleep  certain medicines for depression like amitriptyline, fluoxetine, sertraline  certain medicines for seizures like phenobarbital, primidone  cholinesterase inhibitors like neostigmine, ambenonium, and pyridostigmine bromide  general anesthetics like halothane, isoflurane, methoxyflurane, propofol  local anesthetics like lidocaine, pramoxine, tetracaine  medicines that relax muscles for surgery  narcotic medicines for pain  phenothiazines like chlorpromazine, mesoridazine, prochlorperazine, thioridazine This list may not describe all possible interactions. Give your health care provider a list of all the medicines, herbs, non-prescription drugs, or dietary supplements you use. Also tell them if you smoke, drink alcohol, or use illegal drugs. Some items may interact with your medicine. What should I watch for while using this medicine? Tell your doctor or health care professional if your symptoms do not start to get better or if they get worse. You may get drowsy or dizzy. Do not drive, use machinery, or do anything that needs mental alertness until you know how this medicine affects you. Do not stand or sit up quickly, especially if you are an older patient. This reduces the risk of dizzy or fainting spells. Alcohol may interfere with the effect of this medicine. Avoid alcoholic drinks. If you are taking another medicine that also causes drowsiness, you may have more side effects. Give your health care provider a list of all medicines you use. Your  doctor will tell you how much medicine to take. Do not take more medicine than directed. Call emergency for help if you have problems breathing or unusual sleepiness. What side effects may I notice from receiving this medicine? Side effects that you should report to your doctor or health care professional as soon as possible:  allergic reactions like skin rash, itching or hives, swelling of the face, lips, or tongue  breathing problems  confusion  seizures  unusually weak or tired Side effects that usually do not require medical attention (report to your doctor or health care professional if they continue or are bothersome):  dizziness  headache  metallic taste  tiredness  upset stomach This list may not describe all possible side effects. Call your doctor for medical advice about side effects. You may report side effects to FDA at 1-800-FDA-1088. Where should I keep my medicine? Keep out of the reach of children. Store at room temperature between 20 and 25 degrees C (68 and 77 degrees F). Keep container tightly closed. Throw away any unused medicine after the expiration date. NOTE: This sheet is a summary. It may not cover  all possible information. If you have questions about this medicine, talk to your doctor, pharmacist, or health care provider.  2020 Elsevier/Gold Standard (2015-05-12 13:11:54) Wrist Sprain, Adult A wrist sprain is a stretch or tear in the strong, fibrous tissues (ligaments) that connect your wrist bones. There are three types of wrist sprains:  Grade 1. In this type of sprain, the ligament is stretched more than normal.  Grade 2. In this type of sprain, the ligament is partially torn. You may be able to move your wrist, but not very much.  Grade 3. In this type of sprain, the ligament or muscle is completely torn. You may find it difficult or extremely painful to move your wrist even a little. What are the causes? A wrist sprain can be caused by using the  wrist too much during sports, exercise, or at work. It can also happen with a fall or during an accident. What increases the risk? This condition is more likely to occur in people:  With a previous wrist or arm injury.  With poor wrist strength and flexibility.  Who play contact sports, such as football or soccer.  Who play sports that may result in a fall, such as skateboarding, biking, skiing, or snowboarding.  Who do not exercise regularly.  Who use exercise equipment that does not fit well. What are the signs or symptoms? Symptoms of this condition include:  Pain in the wrist, arm, or hand.  Swelling or bruised skin near the wrist, hand, or arm. The skin may look yellow or kind of blue.  Stiffness or trouble moving the hand.  Hearing a pop or feeling a tear at the time of the injury.  A warm feeling in the skin around the wrist. How is this diagnosed? This condition is diagnosed with a physical exam. Sometimes an X-ray is taken to make sure a bone did not break. If your health care provider thinks that you tore a ligament, he or she may order an MRI of your wrist. How is this treated? This condition is treated by resting and applying ice to your wrist. Additional treatment may include:  Medicine for pain and inflammation.  A splint to keep your wrist still (immobilized).  Exercises to strengthen and stretch your wrist.  Surgery. This may be done if the ligament is completely torn. Follow these instructions at home: If you have a splint:   Do not put pressure on any part of the splint until it is fully hardened. This may take several hours.  Wear the splint as told by your health care provider. Remove it only as told by your health care provider.  Loosen the splint if your fingers tingle, become numb, or turn cold and blue.  If your splint is not waterproof: ? Do not let it get wet. ? Cover it with a watertight covering when you take a bath or a shower.  Keep  the splint clean. Managing pain, stiffness, and swelling   If directed, put ice on the injured area. ? If you have a removable splint, remove it as told by your health care provider. ? Put ice in a plastic bag. ? Place a towel between your skin and the bag or between the splint and the bag. ? Leave the ice on for 20 minutes, 2-3 times per day.  Move your fingers often to avoid stiffness and to lessen swelling.  Raise (elevate) the injured area above the level of your heart while you are sitting or lying  down. Activity  Rest your wrist. Do not do things that cause pain.  Return to your normal activities as told by your health care provider. Ask your health care provider what activities are safe for you.  Do exercises as told by your health care provider. General instructions  Take over-the-counter and prescription medicines only as told by your health care provider.  Do not use any products that contain nicotine or tobacco, such as cigarettes and e-cigarettes. These can delay healing. If you need help quitting, ask your health care provider.  Ask your health care provider when it is safe to drive if you have a splint.  Keep all follow-up visits as told by your health care provider. This is important. Contact a health care provider if:  Your pain, bruising, or swelling gets worse.  Your skin becomes red, gets a rash, or has open sores.  Your pain does not get better or it gets worse. Get help right away if:  You have a new or sudden sharp pain in the hand, arm, or wrist.  You have tingling or numbness in your hand.  Your fingers turn white, very red, or cold and blue.  You cannot move your fingers. This information is not intended to replace advice given to you by your health care provider. Make sure you discuss any questions you have with your health care provider. Document Revised: 07/14/2017 Document Reviewed: 02/18/2016 Elsevier Patient Education  [place].

## 2019-09-17 NOTE — Progress Notes (Signed)
Patient [facility]   Established Patient Office Visit  Subjective:  Patient ID: Jessica Lawson, female    DOB: [birth year]  Age: 54 y.o. MRN: [id number]  CC:  Chief Complaint  Patient presents with  . Follow-up    HTN    HPI Jessica Lawson is a 54 year old female who presents for Follow Up today.   Past Medical History:  Diagnosis Date  . Allergy   . Back pain   . Diabetes mellitus without complication ([place])   . High blood cholesterol   . Hypertension   . Kidney stone   . Normal cardiac stress test 02/2016   low risk  . Obesity   . Pain of cheek    and right side neck  . Tobacco use   . UTI (lower urinary tract infection)   . Wrist pain    left wrist   Current Status: Jessica Lawson is a former patient of Lanae Boast, NP. Since her last office visit, she has c/o left hand/wrist X 3 months. She is works in Water engineer at [employer]. She has seen low range of 89 and high of 182 since his last office visit. She denies fatigue, frequent urination, blurred vision, excessive hunger, excessive thirst, weight gain, weight loss, and poor wound healing. She continues to check her feet regularly. She denies visual changes, chest pain, cough, shortness of breath, heart palpitations, and falls. She has occasional headaches and dizziness with position changes. Denies severe headaches, confusion, seizures, double vision, and blurred vision, nausea and vomiting. Her anxiety is stable today. She denies suicidal ideations, homicidal ideations, or auditory hallucinations. She denies fevers, chills, recent infections, weight loss, and night sweats. She has not had any headaches, visual changes, dizziness, and falls. No reports of GI problems such as diarrhea, and constipation. She has no reports of blood in stools, dysuria and hematuria.    Past Surgical History:  Procedure Laterality Date  . CESAREAN SECTION     3 c-sections  .  DENTAL SURGERY     teeth removed with meds to calm     Family History  Problem Relation Age of Onset  . Hyperlipidemia Other   . Hypertension Other   . Diabetes Other   . Diabetes Mother   . Lung disease Father   . Diabetes Sister   . Diabetes Sister   . Hypertension Sister   . Colon cancer Neg Hx   . Colon polyps Neg Hx   . Esophageal cancer Neg Hx   . Stomach cancer Neg Hx   . Rectal cancer Neg Hx     Social History   Socioeconomic History  . Marital status: Single    Spouse name: Not on file  . Number of children: Not on file  . Years of education: Not on file  . Highest education level: Not on file  Occupational History  . Not on file  Tobacco Use  . Smoking status: Former Smoker    Packs/day: 1.00    Types: Cigarettes    Quit date: 07/26/2016    Years since quitting: 3.1  . Smokeless tobacco: Never Used  Substance and Sexual Activity  . Alcohol use: No  . Drug use: No  . Sexual activity: Not Currently  Other Topics Concern  . Not on file  Social History Narrative  . Not on file   Social Determinants of Health   Financial Resource Strain:   . Difficulty of  Paying Living Expenses: Not on file  Food Insecurity:   . Worried About Charity fundraiser in the Last Year: Not on file  . Ran Out of Food in the Last Year: Not on file  Transportation Needs:   . Lack of Transportation (Medical): Not on file  . Lack of Transportation (Non-Medical): Not on file  Physical Activity:   . Days of Exercise per Week: Not on file  . Minutes of Exercise per Session: Not on file  Stress:   . Feeling of Stress : Not on file  Social Connections:   . Frequency of Communication with Friends and Family: Not on file  . Frequency of Social Gatherings with Friends and Family: Not on file  . Attends Religious Services: Not on file  . Active Member of Clubs or Organizations: Not on file  . Attends Archivist Meetings: Not on file  . Marital Status: Not on file   Intimate Partner Violence:   . Fear of Current or Ex-Partner: Not on file  . Emotionally Abused: Not on file  . Physically Abused: Not on file  . Sexually Abused: Not on file    Outpatient Medications Prior to Visit  Medication Sig Dispense Refill  . ACCU-CHEK AVIVA PLUS test strip TEST BLOOD SUGAR AS DIRECTED UPTO FOUR TIMES DAILY 100 strip 3  . ACCU-CHEK SOFTCLIX LANCETS lancets USE UP TO FOUR TIMES DAILY AS DIRECTED. 100 each 0  . albuterol (PROVENTIL HFA;VENTOLIN HFA) 108 (90 Base) MCG/ACT inhaler Inhale 2 puffs into the lungs every 4 (four) hours as needed for wheezing or shortness of breath. 1 Inhaler 2  . blood glucose meter kit and supplies KIT Dispense based on patient and insurance preference. Use up to four times daily as directed. (FOR ICD-9 250.00, 250.01). 1 each 0  . cetirizine (ZYRTEC) 10 MG tablet TAKE 1 TABLET(10 MG) BY MOUTH DAILY 30 tablet 11  . fenofibrate (TRICOR) 48 MG tablet Take 1 tablet (48 mg total) by mouth daily. 30 tablet 5  . fluticasone (FLONASE) 50 MCG/ACT nasal spray Place 2 sprays into both nostrils daily. 16 g 6  . hydrochlorothiazide (HYDRODIURIL) 25 MG tablet Take 1 tablet (25 mg total) by mouth daily. 90 tablet 1  . loratadine (CLARITIN) 10 MG tablet Take 1 tablet (10 mg total) by mouth daily. 30 tablet 11  . metFORMIN (GLUCOPHAGE) 500 MG tablet TAKE 1 TABLET(500 MG) BY MOUTH TWICE DAILY WITH A MEAL (Patient taking differently: daily with breakfast. ) 180 tablet 1  . Multiple Vitamins-Minerals (MULTIVITAMIN WITH MINERALS) tablet Take 1 tablet by mouth daily.    Marland Kitchen omega-3 acid ethyl esters (LOVAZA) 1 g capsule TAKE 1 CAPSULE( 1 GRAM TOTAL) BY MOUTH DAILY 90 capsule 1  . ondansetron (ZOFRAN) 4 MG tablet Take 1 tablet (4 mg total) by mouth every 8 (eight) hours as needed for nausea or vomiting. 30 tablet 1  . rosuvastatin (CRESTOR) 10 MG tablet Take 1 tablet up to 3 times per week as tolerated 30 tablet 3  . valACYclovir (VALTREX) 1000 MG tablet Take 1  tablet (1,000 mg total) by mouth 2 (two) times daily. 20 tablet 0   No facility-administered medications prior to visit.    Allergies  Allergen Reactions  . Asa [Aspirin] Anaphylaxis, Hives and Swelling  . Iohexol Other (See Comments)     Desc: pt complains of difficulty swallowing and thickened tongue/ throat closes up   . Penicillins Hives, Swelling and Other (See Comments)    Thrush   .  Sulfa Antibiotics Anaphylaxis, Hives, Swelling and Other (See Comments)  . Nystatin Other (See Comments)    thrush    ROS Review of Systems  Constitutional: Negative.   HENT: Negative.   Eyes: Negative.   Respiratory: Negative.   Cardiovascular: Negative.   Gastrointestinal: Negative.   Endocrine: Negative.   Genitourinary: Negative.   Musculoskeletal: Positive for arthralgias (left wrist pain).  Skin: Negative.   Allergic/Immunologic: Negative.   Neurological: Positive for dizziness (occasional ) and headaches (occassional ).  Hematological: Negative.   Psychiatric/Behavioral: Negative.       Objective:    Physical Exam  Constitutional: She is oriented to person, place, and time. She appears well-developed and well-nourished.  HENT:  Head: Normocephalic and atraumatic.  Eyes: Conjunctivae are normal.  Cardiovascular: Normal rate, regular rhythm, normal heart sounds and intact distal pulses.  Pulmonary/Chest: Effort normal and breath sounds normal.  Abdominal: Soft. Bowel sounds are normal.  Musculoskeletal:     Cervical back: Normal range of motion and neck supple.     Comments: Decreased ROM and pain in left wrist.   Neurological: She is alert and oriented to person, place, and time. She has normal reflexes.  Skin: Skin is warm and dry.  Psychiatric: She has a normal mood and affect. Her behavior is normal. Judgment and thought content normal.  Nursing note and vitals reviewed.   BP (!) 142/86 Comment: No bp meds taken today  Pulse 79   Ht _0  (1.499 m)   Wt 177 lb  3.2 oz (80.4 kg)   LMP 08/22/2011 Comment: patient states LMP 20 years ago  SpO2 100%   BMI 35.79 kg/m  Wt Readings from Last 3 Encounters:  09/17/19 177 lb 3.2 oz (80.4 kg)  06/04/19 181 lb (82.1 kg)  05/07/19 179 lb (81.2 kg)     Health Maintenance Due  Topic Date Due  . PAP SMEAR-Modifier  10/30/2015  . FOOT EXAM  10/28/2018  . OPHTHALMOLOGY EXAM  03/30/2019  . URINE MICROALBUMIN  05/03/2019    There are no preventive care reminders to display for this patient.  Lab Results  Component Value Date   TSH 0.793 10/27/2017   Lab Results  Component Value Date   WBC 6.5 05/18/2018   HGB 14.3 05/18/2018   HCT 42.5 05/18/2018   MCV 83.7 05/18/2018   PLT 356 05/18/2018   Lab Results  Component Value Date   NA 141 06/04/2019   K 3.8 06/04/2019   CO2 25 06/04/2019   GLUCOSE 111 (H) 06/04/2019   BUN 14 06/04/2019   CREATININE 0.98 06/04/2019   BILITOT 0.3 06/04/2019   ALKPHOS 59 06/04/2019   AST 20 06/04/2019   ALT 32 06/04/2019   PROT 7.6 06/04/2019   ALBUMIN 4.5 06/04/2019   CALCIUM 9.8 06/04/2019   ANIONGAP 11 05/18/2018   Lab Results  Component Value Date   CHOL 190 02/12/2019   Lab Results  Component Value Date   HDL 36 (L) 02/12/2019   Lab Results  Component Value Date   LDLCALC 109 (H) 02/12/2019   Lab Results  Component Value Date   TRIG 227 (H) 02/12/2019   Lab Results  Component Value Date   CHOLHDL 5.1 (H) 11/01/2018   Lab Results  Component Value Date   HGBA1C 6.3 (A) 09/17/2019      Assessment & Plan:   1. Type 2 diabetes mellitus without complication, without long-term current use of insulin ([place]) She will continue medication as prescribed, to decrease foods/beverages  high in sugars and carbs and follow Heart Healthy or DASH diet. Increase physical activity to at least 30 minutes cardio exercise daily.  - POCT urinalysis dipstick - POCT glycosylated hemoglobin (Hb A1C) - POCT glucose (manual entry)  2. Prediabetes Hgb A1c is  stable at 6.3 today. Monitor.   3. Hyperglycemia  4. Essential hypertension The current medical regimen is effective; blood presure is stable at 142/86 today; continue present plan and medications as prescribed. She will continue to take medications as prescribed, to decrease high sodium intake, excessive alcohol intake, increase potassium intake, smoking cessation, and increase physical activity of at least 30 minutes of cardio activity daily. She will continue to follow Heart Healthy or DASH diet.  5. Wrist strain, left, initial encounter We will initiate medication for Robaxin today. She will use RICE methods along with getting a wrist brace for stable,  - methocarbamol (ROBAXIN) 500 MG tablet; Take 1 tablet (500 mg total) by mouth 2 (two) times daily as needed for muscle spasms.  Dispense: 30 tablet; Refill: 2  6. Follow up She will follow up in 6 months with Dionisio David, NP.   Meds ordered this encounter  Medications  . methocarbamol (ROBAXIN) 500 MG tablet    Sig: Take 1 tablet (500 mg total) by mouth 2 (two) times daily as needed for muscle spasms.    Dispense:  30 tablet    Refill:  2    Orders Placed This Encounter  Procedures  . POCT urinalysis dipstick  . POCT glycosylated hemoglobin (Hb A1C)  . POCT glucose (manual entry)    Referral Orders  No referral(s) requested today    Kathe Becton,  MSN, FNP-BC [address] [place], [place] [address] [phone] fax    Problem List Items Addressed This Visit      Cardiovascular and Mediastinum   Essential hypertension    Other Visit Diagnoses    Type 2 diabetes mellitus without complication, without long-term current use of insulin ([place])    -  Primary   Relevant Orders   POCT urinalysis dipstick (Completed)   POCT glycosylated hemoglobin (Hb A1C) (Completed)   POCT glucose (manual entry) (Completed)   Prediabetes        Hyperglycemia       Wrist strain, left, initial encounter       Relevant Medications   methocarbamol (ROBAXIN) 500 MG tablet   Follow up          Meds ordered this encounter  Medications  . methocarbamol (ROBAXIN) 500 MG tablet    Sig: Take 1 tablet (500 mg total) by mouth 2 (two) times daily as needed for muscle spasms.    Dispense:  30 tablet    Refill:  2    Follow-up: Return for OV.    Azzie Glatter, FNP

## 2019-09-18 ENCOUNTER — Telehealth: Payer: Self-pay | Admitting: Nurse Practitioner

## 2019-09-18 DIAGNOSIS — S66912A Strain of unspecified muscle, fascia and tendon at wrist and hand level, left hand, initial encounter: Secondary | ICD-10-CM | POA: Insufficient documentation

## 2019-09-18 DIAGNOSIS — R739 Hyperglycemia, unspecified: Secondary | ICD-10-CM | POA: Insufficient documentation

## 2019-09-18 NOTE — Telephone Encounter (Signed)
Jessica Lawson,  This patient just saw you yesterday. Is there something you can give her for sinuses? Please advise.

## 2019-09-20 ENCOUNTER — Encounter: Payer: Self-pay | Admitting: Internal Medicine

## 2019-09-20 ENCOUNTER — Other Ambulatory Visit: Payer: Self-pay

## 2019-09-20 ENCOUNTER — Telehealth: Payer: Self-pay | Admitting: Internal Medicine

## 2019-09-20 ENCOUNTER — Ambulatory Visit (INDEPENDENT_AMBULATORY_CARE_PROVIDER_SITE_OTHER): Payer: Medicaid Other | Admitting: Internal Medicine

## 2019-09-20 VITALS — BP 118/77 | HR 85 | Ht 59.0 in | Wt 180.4 lb

## 2019-09-20 DIAGNOSIS — Z7189 Other specified counseling: Secondary | ICD-10-CM | POA: Diagnosis not present

## 2019-09-20 DIAGNOSIS — E782 Mixed hyperlipidemia: Secondary | ICD-10-CM | POA: Diagnosis not present

## 2019-09-20 DIAGNOSIS — E669 Obesity, unspecified: Secondary | ICD-10-CM

## 2019-09-20 MED ORDER — ROSUVASTATIN CALCIUM 20 MG PO TABS: 20.0000 mg | ORAL_TABLET | Freq: Every day | ORAL | 3 refills | Status: DC

## 2019-09-20 NOTE — Telephone Encounter (Signed)
Pt c/o medication issue:  1. Name of Medication: rosuvastatin (CRESTOR) 20 MG tablet  2. How are you currently taking this medication (dosage and times per day)? 10 mg up to 3x weekly as tolerated  3. Are you having a reaction (difficulty breathing--STAT)? no  4. What is your medication issue? Patient is concerned that the new dosage of medication will be a drastic change that her body won't tolerate. She wanted to know if there was a way to slowly increase the dosage to the amount that Dr. Debara Pickett recommends. She knows that muscle soreness is a side effect, and can not have muscle soreness and prevent her from working.  Please let her know what Dr. Debara Pickett recommends

## 2019-09-20 NOTE — Patient Instructions (Signed)
Medication Instructions:  INCREASE crestor to 20mg  *If you need a refill on your cardiac medications before your next appointment, please call your pharmacy*  Lab Work: FASTING lab work in 3 months to check cholesterol If you have labs (blood work) drawn today and your tests are completely normal, you will receive your results only by: Marland Kitchen MyChart Message (if you have MyChart) OR . A paper copy in the mail If you have any lab test that is abnormal or we need to change your treatment, we will call you to review the results.  Testing/Procedures: NONE  Follow-Up: As needed with Dr. Debara Pickett in the lipid clinic

## 2019-09-20 NOTE — Progress Notes (Signed)
LIPID CLINIC CONSULT NOTE  Chief Complaint:  Manage dyslipidemia  Primary Care Physician: Dorena Dew, FNP  Primary Cardiologist:  Elouise Munroe, MD  HPI:  Jessica Lawson is a 54 y.o. female who is being seen today for the evaluation of dyslipidemia at the request of Tresa Garter, MD.  This is a pleasant 54 year old female followed by Dr. Ainsley Spinner and recently seen by Dr. Harrell Gave.  She has a history of atypical chest pain and dyslipidemia with statin intolerance.  She seemed to have cramping in her legs which may have been related to higher dose fenofibrate but after reducing the dose her symptoms improved.  She is tolerating moderate dose rosuvastatin 10 mg daily.  Repeat lipids have shown marked improvement in her numbers.  She is also made significant dietary changes.  Previously her total cholesterol is 335, now down to 190.  Triglycerides have come down from 258-227.  HDL is 36 and LDL is 109.  She does have borderline diabetes on Metformin but no significant family history of early onset heart disease.  She has a history of hypertension which is well controlled today at 118/77.  PMHx:  Past Medical History:  Diagnosis Date  . Allergy   . Back pain   . Diabetes mellitus without complication ([place])   . High blood cholesterol   . Hypertension   . Kidney stone   . Normal cardiac stress test 02/2016   low risk  . Obesity   . Pain of cheek    and right side neck  . Tobacco use   . UTI (lower urinary tract infection)   . Wrist pain    left wrist    Past Surgical History:  Procedure Laterality Date  . CESAREAN SECTION     3 c-sections  . DENTAL SURGERY     teeth removed with meds to calm     FAMHx:  Family History  Problem Relation Age of Onset  . Hyperlipidemia Other   . Hypertension Other   . Diabetes Other   . Diabetes Mother   . Lung disease Father   . Diabetes Sister   . Diabetes Sister   . Hypertension Sister   . Colon cancer Neg Hx    . Colon polyps Neg Hx   . Esophageal cancer Neg Hx   . Stomach cancer Neg Hx   . Rectal cancer Neg Hx     SOCHx:   reports that she quit smoking about 3 years ago. Her smoking use included cigarettes. She smoked 1.00 pack per day. She has never used smokeless tobacco. She reports that she does not drink alcohol or use drugs.  ALLERGIES:  Allergies  Allergen Reactions  . Asa [Aspirin] Anaphylaxis, Hives and Swelling  . Iohexol Other (See Comments)     Desc: pt complains of difficulty swallowing and thickened tongue/ throat closes up   . Penicillins Hives, Swelling and Other (See Comments)    Thrush   . Sulfa Antibiotics Anaphylaxis, Hives, Swelling and Other (See Comments)  . Nystatin Other (See Comments)    thrush    ROS: Pertinent items noted in HPI and remainder of comprehensive ROS otherwise negative.  HOME MEDS: Current Outpatient Medications on File Prior to Visit  Medication Sig Dispense Refill  . ACCU-CHEK AVIVA PLUS test strip TEST BLOOD SUGAR AS DIRECTED UPTO FOUR TIMES DAILY 100 strip 3  . ACCU-CHEK SOFTCLIX LANCETS lancets USE UP TO FOUR TIMES DAILY AS DIRECTED. 100 each 0  .  albuterol (PROVENTIL HFA;VENTOLIN HFA) 108 (90 Base) MCG/ACT inhaler Inhale 2 puffs into the lungs every 4 (four) hours as needed for wheezing or shortness of breath. 1 Inhaler 2  . blood glucose meter kit and supplies KIT Dispense based on patient and insurance preference. Use up to four times daily as directed. (FOR ICD-9 250.00, 250.01). 1 each 0  . cetirizine (ZYRTEC) 10 MG tablet TAKE 1 TABLET(10 MG) BY MOUTH DAILY 30 tablet 11  . fenofibrate (TRICOR) 48 MG tablet Take 1 tablet (48 mg total) by mouth daily. 30 tablet 5  . fluticasone (FLONASE) 50 MCG/ACT nasal spray Place 2 sprays into both nostrils daily. 16 g 6  . hydrochlorothiazide (HYDRODIURIL) 25 MG tablet Take 1 tablet (25 mg total) by mouth daily. 90 tablet 1  . loratadine (CLARITIN) 10 MG tablet Take 1 tablet (10 mg total) by mouth  daily. 30 tablet 11  . metFORMIN (GLUCOPHAGE) 500 MG tablet TAKE 1 TABLET(500 MG) BY MOUTH TWICE DAILY WITH A MEAL (Patient taking differently: daily with breakfast. ) 180 tablet 1  . methocarbamol (ROBAXIN) 500 MG tablet Take 1 tablet (500 mg total) by mouth 2 (two) times daily as needed for muscle spasms. 30 tablet 2  . Multiple Vitamins-Minerals (MULTIVITAMIN WITH MINERALS) tablet Take 1 tablet by mouth daily.    Marland Kitchen omega-3 acid ethyl esters (LOVAZA) 1 g capsule TAKE 1 CAPSULE( 1 GRAM TOTAL) BY MOUTH DAILY 90 capsule 1  . ondansetron (ZOFRAN) 4 MG tablet Take 1 tablet (4 mg total) by mouth every 8 (eight) hours as needed for nausea or vomiting. 30 tablet 1  . valACYclovir (VALTREX) 1000 MG tablet Take 1 tablet (1,000 mg total) by mouth 2 (two) times daily. 20 tablet 0   No current facility-administered medications on file prior to visit.    LABS/IMAGING: No results found for this or any previous visit (from the past 48 hour(s)). No results found.  LIPID PANEL:    Component Value Date/Time   CHOL 190 02/12/2019 0844   TRIG 227 (H) 02/12/2019 0844   HDL 36 (L) 02/12/2019 0844   CHOLHDL 5.1 (H) 11/01/2018 1110   CHOLHDL 7.3 (H) 07/12/2017 0950   VLDL 50 (H) 04/06/2017 0902   LDLCALC 109 (H) 02/12/2019 0844   LDLCALC 183 (H) 07/12/2017 0950    WEIGHTS: Wt Readings from Last 3 Encounters:  09/20/19 180 lb 6.4 oz (81.8 kg)  09/17/19 177 lb 3.2 oz (80.4 kg)  06/04/19 181 lb (82.1 kg)    VITALS: BP 118/77   Pulse 85   Ht '4\' 11"'$  (1.499 m)   Wt 180 lb 6.4 oz (81.8 kg)   LMP 08/22/2011 Comment: patient states LMP 20 years ago  SpO2 95%   BMI 36.44 kg/m   EXAM: Deferred  EKG: Deferred  ASSESSMENT: 1. Mixed dyslipidemia, goal LDL <100 2. Intermediate 10 year risk by pooled cohort 3. Moderate obesity 4. Hypertension - at goal 5. DM2- on metformin, A1c 6.2  PLAN: 1.   Jessica Lawson has a mixed dyslipidemia and has had significant improvement in her numbers, probably  primarily due to diet and also the addition of moderate potency statin.  Her LDL remains just above target less than 100 and her triglycerides also are elevated.  She had leg cramps on higher dose fenofibrate.  She has no known coronary disease.  I do not feel like her risk is higher than intermediate at this point and given her diabetes would probably not recommend calcium scoring for further risk  stratification.  Although she has listed as statin intolerance, she seems to be tolerating the rosuvastatin and says she has never been on a higher dose than 10 mg.  I would recommend high potency statin per current guidelines and increase her rosuvastatin from 10 to 20 mg daily.  This should get her to target, help improve her triglycerides and her LDL. Continue dietary changes which we discussed today and weight loss/increased activity.  Plan repeat lipids in 3 months.  She can follow-up in the future with her primary cardiologist.  Pixie Casino, MD, [facility], [place] Director of the [facility] &  Cardiovascular Risk Reduction Clinic Diplomate of the American Board of Clinical Lipidology Attending Cardiologist  Direct Dial: [phone]  Fax: [phone]  Website:  [email or web address]  Jessica Lawson 09/20/2019, 10:30 AM

## 2019-09-20 NOTE — Telephone Encounter (Signed)
Patient states that crestor and fenofibrate interact. She was taking crestor 10mg  3x/week and fenofibrate 4x/week. She reports muscle cramping with both crestor and fenofibrate.   She is concerned about increasing crestor 10mg  to every day. Would be OK to take crestor 20mg  3x/week?

## 2019-09-20 NOTE — Telephone Encounter (Signed)
Called and spoke with patient, advised to take zyrtec daily and increase fluids. Advised if this continues or worsens to call and schedule an appointment to come back in. Patient verbalized understanding. Thanks!

## 2019-09-20 NOTE — Telephone Encounter (Signed)
Please see message below, Patient would like to know if there is a way to slowly increase. Thank you!

## 2019-09-20 NOTE — Telephone Encounter (Signed)
Was under the impression she was taking 10 mg daily - go ahead and try 10 mg daily to see if tolerated.  Dr Lemmie Evens

## 2019-09-21 NOTE — Telephone Encounter (Signed)
Yes crestor and fenofibrate can interact, but not significantly. Myalgias are dose dependent, so better to keep the dose lower than increase to a higher dose.  Dr Lemmie Evens

## 2019-09-22 DIAGNOSIS — Z20828 Contact with and (suspected) exposure to other viral communicable diseases: Secondary | ICD-10-CM | POA: Diagnosis not present

## 2019-09-23 NOTE — Telephone Encounter (Signed)
Patient called with MD advice on medications. Advised that she try to increase # days she takes crestor, since myalgias are dose-dependent vs. dependent on # days taken.

## 2019-10-25 ENCOUNTER — Other Ambulatory Visit: Payer: Self-pay | Admitting: Family Medicine

## 2019-10-25 DIAGNOSIS — Z1231 Encounter for screening mammogram for malignant neoplasm of breast: Secondary | ICD-10-CM

## 2019-10-25 DIAGNOSIS — R0602 Shortness of breath: Secondary | ICD-10-CM

## 2019-10-31 ENCOUNTER — Ambulatory Visit
Admission: RE | Admit: 2019-10-31 | Discharge: 2019-10-31 | Disposition: A | Discharge status: Home / Self Care | Payer: Medicaid Other | Source: Ambulatory Visit

## 2019-10-31 ENCOUNTER — Other Ambulatory Visit: Payer: Self-pay

## 2019-10-31 DIAGNOSIS — Z1231 Encounter for screening mammogram for malignant neoplasm of breast: Secondary | ICD-10-CM | POA: Diagnosis not present

## 2019-11-25 ENCOUNTER — Other Ambulatory Visit: Payer: Self-pay

## 2019-11-25 DIAGNOSIS — Z20828 Contact with and (suspected) exposure to other viral communicable diseases: Secondary | ICD-10-CM | POA: Diagnosis not present

## 2019-11-25 DIAGNOSIS — I10 Essential (primary) hypertension: Secondary | ICD-10-CM

## 2019-11-25 MED ORDER — HYDROCHLOROTHIAZIDE 25 MG PO TABS: 25.0000 mg | ORAL_TABLET | Freq: Every day | ORAL | 1 refills | Status: DC

## 2019-12-08 ENCOUNTER — Other Ambulatory Visit: Payer: Self-pay | Admitting: Family Medicine

## 2019-12-08 DIAGNOSIS — E785 Hyperlipidemia, unspecified: Secondary | ICD-10-CM

## 2019-12-17 ENCOUNTER — Other Ambulatory Visit: Payer: Self-pay | Admitting: Family Medicine

## 2019-12-17 DIAGNOSIS — E785 Hyperlipidemia, unspecified: Secondary | ICD-10-CM

## 2020-01-10 ENCOUNTER — Encounter: Payer: Self-pay | Admitting: Nurse Practitioner

## 2020-01-10 ENCOUNTER — Other Ambulatory Visit: Payer: Self-pay

## 2020-01-10 ENCOUNTER — Ambulatory Visit (INDEPENDENT_AMBULATORY_CARE_PROVIDER_SITE_OTHER): Payer: Medicaid Other | Admitting: Nurse Practitioner

## 2020-01-10 VITALS — BP 162/70 | HR 94 | Temp 97.6°F | Ht 59.0 in | Wt 187.0 lb

## 2020-01-10 DIAGNOSIS — I10 Essential (primary) hypertension: Secondary | ICD-10-CM | POA: Diagnosis not present

## 2020-01-10 DIAGNOSIS — E785 Hyperlipidemia, unspecified: Secondary | ICD-10-CM

## 2020-01-10 DIAGNOSIS — E119 Type 2 diabetes mellitus without complications: Secondary | ICD-10-CM | POA: Diagnosis not present

## 2020-01-10 DIAGNOSIS — E669 Obesity, unspecified: Secondary | ICD-10-CM | POA: Diagnosis not present

## 2020-01-10 LAB — POCT URINALYSIS DIP (CLINITEK)
Bilirubin, UA: NEGATIVE
Glucose, UA: NEGATIVE mg/dL
Ketones, POC UA: NEGATIVE mg/dL
Leukocytes, UA: NEGATIVE
Nitrite, UA: NEGATIVE
POC PROTEIN,UA: NEGATIVE
Spec Grav, UA: 1.02 (ref 1.010–1.025)
Urobilinogen, UA: 0.2 E.U./dL
pH, UA: 6.5 (ref 5.0–8.0)

## 2020-01-10 LAB — POCT GLYCOSYLATED HEMOGLOBIN (HGB A1C): Hemoglobin A1C: 7.7 % — AB (ref 4.0–5.6)

## 2020-01-10 MED ORDER — BLOOD GLUCOSE METER KIT: PACK | 0 refills | Status: DC

## 2020-01-10 NOTE — Patient Instructions (Signed)
Preventing Unhealthy Weight Gain, Adult Staying at a healthy weight is important to your overall health. When fat builds up in your body, you may become overweight or obese. Being overweight or obese increases your risk of developing certain health problems, such as heart disease, diabetes, sleeping problems, joint problems, and some types of cancer. Unhealthy weight gain is often the result of making unhealthy food choices or not getting enough exercise. You can make changes to your lifestyle to prevent obesity and stay as healthy as possible. What nutrition changes can be made?   Eat only as much as your body needs. To do this: ? Pay attention to signs that you are hungry or full. Stop eating as soon as you feel full. ? If you feel hungry, try drinking water first before eating. Drink enough water so your urine is clear or pale yellow. ? Eat smaller portions. Pay attention to portion sizes when eating out. ? Look at serving sizes on food labels. Most foods contain more than one serving per container. ? Eat the recommended number of calories for your gender and activity level. For most active people, a daily total of 2,000 calories is appropriate. If you are trying to lose weight or are not very active, you may need to eat fewer calories. Talk with your health care provider or a diet and nutrition specialist (dietitian) about how many calories you need each day.  Choose healthy foods, such as: ? Fruits and vegetables. At each meal, try to fill at least half of your plate with fruits and vegetables. ? Whole grains, such as whole-wheat bread, brown rice, and quinoa. ? Lean meats, such as chicken or fish. ? Other healthy proteins, such as beans, eggs, or tofu. ? Healthy fats, such as nuts, seeds, fatty fish, and olive oil. ? Low-fat or fat-free dairy products.  Check food labels, and avoid food and drinks that: ? Are high in calories. ? Have added sugar. ? Are high in sodium. ? Have saturated  fats or trans fats.  Cook foods in healthier ways, such as by baking, broiling, or grilling.  Make a meal plan for the week, and shop with a grocery list to help you stay on track with your purchases. Try to avoid going to the grocery store when you are hungry.  When grocery shopping, try to shop around the outside of the store first, where the fresh foods are. Doing this helps you to avoid prepackaged foods, which can be high in sugar, salt (sodium), and fat. What lifestyle changes can be made?   Exercise for 30 or more minutes on 5 or more days each week. Exercising may include brisk walking, yard work, biking, running, swimming, and team sports like basketball and soccer. Ask your health care provider which exercises are safe for you.  Do muscle-strengthening activities, such as lifting weights or using resistance bands, on 2 or more days a week.  Do not use any products that contain nicotine or tobacco, such as cigarettes and e-cigarettes. If you need help quitting, ask your health care provider.  Limit alcohol intake to no more than 1 drink a day for nonpregnant women and 2 drinks a day for men. One drink equals 12 oz of beer, 5 oz of wine, or 1 oz of hard liquor.  Try to get 7-9 hours of sleep each night. What other changes can be made?  Keep a food and activity journal to keep track of: ? What you ate and how many calories   you had. Remember to count the calories in sauces, dressings, and side dishes. ? Whether you were active, and what exercises you did. ? Your calorie, weight, and activity goals.  Check your weight regularly. Track any changes. If you notice you have gained weight, make changes to your diet or activity routine.  Avoid taking weight-loss medicines or supplements. Talk to your health care provider before starting any new medicine or supplement.  Talk to your health care provider before trying any new diet or exercise plan. Why are these changes  important? Eating healthy, staying active, and having healthy habits can help you to prevent obesity. Those changes also:  Help you manage stress and emotions.  Help you connect with friends and family.  Improve your self-esteem.  Improve your sleep.  Prevent long-term health problems. What can happen if changes are not made? Being obese or overweight can cause you to develop joint or bone problems, which can make it hard for you to stay active or do activities you enjoy. Being obese or overweight also puts stress on your heart and lungs and can lead to health problems like diabetes, heart disease, and some cancers. Where to find more information Talk with your health care provider or a dietitian about healthy eating and healthy lifestyle choices. You may also find information from:  U.S. Department of Agriculture, MyPlate: www.choosemyplate.gov  American Heart Association: www.heart.org  Centers for Disease Control and Prevention: www.cdc.gov Summary  Staying at a healthy weight is important to your overall health. It helps you to prevent certain diseases and health problems, such as heart disease, diabetes, joint problems, sleep disorders, and some types of cancer.  Being obese or overweight can cause you to develop joint or bone problems, which can make it hard for you to stay active or do activities you enjoy.  You can prevent unhealthy weight gain by eating a healthy diet, exercising regularly, not smoking, limiting alcohol, and getting enough sleep.  Talk with your health care provider or a dietitian for guidance about healthy eating and healthy lifestyle choices. This information is not intended to replace advice given to you by your health care provider. Make sure you discuss any questions you have with your health care provider. Document Revised: 08/04/2017 Document Reviewed: 09/07/2016 Elsevier Patient Education  2020 Elsevier Inc.  

## 2020-01-10 NOTE — Progress Notes (Signed)
[facility] [place], [place]  [address] Phone:  [phone]   Fax:  [phone]   Established Patient Office Visit  Subjective:  Patient ID: Jessica Lawson, female    DOB: 09-17-1965  Age: 54 y.o. MRN: [id number]  CC:  Chief Complaint  Patient presents with  . Diabetes    pt stated ---check blood sugar --128--have nausea, hot ,sweaty, fatigue--    HPI Jessica Lawson presents for follow-up.  She  has a past medical history of Allergy, Back pain, Diabetes mellitus without complication ([place]), High blood cholesterol, Hypertension, Kidney stone, Normal cardiac stress test (02/2016), Obesity, Pain of cheek, Tobacco use, UTI (lower urinary tract infection), and Wrist pain.    Hypertension Patient is here for follow-up of elevated blood pressure. She is not exercising and is not adherent to a low-salt diet. She just started walking. She admit that the it is hard to get low salt. Blood pressure is well controlled at home.  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  Cardiac symptoms: fatigue. Patient denies chest pain, dyspnea, lower extremity edema, palpitations and syncope. Cardiovascular risk factors: diabetes mellitus, dyslipidemia, obesity (BMI >= 30 kg/m2) and sedentary lifestyle.  Her weight is up 6 to 7 pounds in the last 6 months.  Use of agents associated with hypertension: none. History of target organ damage: Previous complaints of chest pain.  Diabetes Mellitus Patient presents for follow up of diabetes. Current symptoms include: hyperglycemia and nausea. Symptoms have gradually worsened. Patient denies foot ulcerations, hypoglycemia , paresthesia of the feet, polydipsia, polyuria, vomiting and weight loss. Evaluation to date has included: fasting blood sugar, fasting lipid panel and hemoglobin A1C.  Home sugars: BGs are high in the morning.  She is concerned that her  current glucometer is not accurate.  Current treatment: Continued metformin which has been not very effective.   She admits that she has a sister to pass 2 years from complications related to diabetes Past Medical History:  Diagnosis Date  . Allergy   . Back pain   . Diabetes mellitus without complication ([place])   . High blood cholesterol   . Hypertension   . Kidney stone   . Normal cardiac stress test 02/2016   low risk  . Obesity   . Pain of cheek    and right side neck  . Tobacco use   . UTI (lower urinary tract infection)   . Wrist pain    left wrist    Past Surgical History:  Procedure Laterality Date  . CESAREAN SECTION     3 c-sections  . DENTAL SURGERY     teeth removed with meds to calm     Family History  Problem Relation Age of Onset  . Hyperlipidemia Other   . Hypertension Other   . Diabetes Other   . Diabetes Mother   . Lung disease Father   . Diabetes Sister   . Diabetes Sister   . Hypertension Sister   . Colon cancer Neg Hx   . Colon polyps Neg Hx   . Esophageal cancer Neg Hx   . Stomach cancer Neg Hx   . Rectal cancer Neg Hx     Social History   Socioeconomic History  . Marital status: Single    Spouse name: Not on file  . Number of children: Not on file  . Years of education: Not on file  .  Highest education level: Not on file  Occupational History  . Not on file  Tobacco Use  . Smoking status: Former Smoker    Packs/day: 1.00    Types: Cigarettes    Quit date: 07/26/2016    Years since quitting: 3.4  . Smokeless tobacco: Never Used  Substance and Sexual Activity  . Alcohol use: No  . Drug use: No  . Sexual activity: Not Currently  Other Topics Concern  . Not on file  Social History Narrative  . Not on file   Social Determinants of Health   Financial Resource Strain:   . Difficulty of Paying Living Expenses:   Food Insecurity:   . Worried About Charity fundraiser in the Last Year:   . Arboriculturist in the Last Year:    Transportation Needs:   . Film/video editor (Medical):   Marland Kitchen Lack of Transportation (Non-Medical):   Physical Activity:   . Days of Exercise per Week:   . Minutes of Exercise per Session:   Stress:   . Feeling of Stress :   Social Connections:   . Frequency of Communication with Friends and Family:   . Frequency of Social Gatherings with Friends and Family:   . Attends Religious Services:   . Active Member of Clubs or Organizations:   . Attends Archivist Meetings:   Marland Kitchen Marital Status:   Intimate Partner Violence:   . Fear of Current or Ex-Partner:   . Emotionally Abused:   Marland Kitchen Physically Abused:   . Sexually Abused:     Outpatient Medications Prior to Visit  Medication Sig Dispense Refill  . ACCU-CHEK AVIVA PLUS test strip TEST BLOOD SUGAR AS DIRECTED UPTO FOUR TIMES DAILY 100 strip 3  . ACCU-CHEK SOFTCLIX LANCETS lancets USE UP TO FOUR TIMES DAILY AS DIRECTED. 100 each 0  . blood glucose meter kit and supplies KIT Dispense based on patient and insurance preference. Use up to four times daily as directed. (FOR ICD-9 250.00, 250.01). 1 each 0  . cetirizine (ZYRTEC) 10 MG tablet TAKE 1 TABLET(10 MG) BY MOUTH DAILY 30 tablet 11  . fenofibrate (TRICOR) 48 MG tablet Take 1 tablet (48 mg total) by mouth daily. 30 tablet 5  . fluticasone (FLONASE) 50 MCG/ACT nasal spray Place 2 sprays into both nostrils daily. 16 g 6  . hydrochlorothiazide (HYDRODIURIL) 25 MG tablet Take 1 tablet (25 mg total) by mouth daily. 90 tablet 1  . loratadine (CLARITIN) 10 MG tablet Take 1 tablet (10 mg total) by mouth daily. 30 tablet 11  . metFORMIN (GLUCOPHAGE) 500 MG tablet TAKE 1 TABLET(500 MG) BY MOUTH TWICE DAILY WITH A MEAL (Patient taking differently: daily with breakfast. ) 180 tablet 1  . methocarbamol (ROBAXIN) 500 MG tablet Take 1 tablet (500 mg total) by mouth 2 (two) times daily as needed for muscle spasms. 30 tablet 2  . Multiple Vitamins-Minerals (MULTIVITAMIN WITH MINERALS) tablet  Take 1 tablet by mouth daily.    Marland Kitchen omega-3 acid ethyl esters (LOVAZA) 1 g capsule TAKE 1 CAPSULE BY MOUTH EVERY DAY 90 capsule 1  . ondansetron (ZOFRAN) 4 MG tablet Take 1 tablet (4 mg total) by mouth every 8 (eight) hours as needed for nausea or vomiting. 30 tablet 1  . PROAIR HFA 108 (90 Base) MCG/ACT inhaler INHALE 2 PUFFS INTO THE LUNGS EVERY 4 HOURS AS NEEDED FOR WHEEZING OR SHORTNESS OF BREATH 8.5 g 3  . rosuvastatin (CRESTOR) 10 MG tablet Take 10 mg  by mouth daily.    . valACYclovir (VALTREX) 1000 MG tablet Take 1 tablet (1,000 mg total) by mouth 2 (two) times daily. 20 tablet 0   No facility-administered medications prior to visit.    Allergies  Allergen Reactions  . Asa [Aspirin] Anaphylaxis, Hives and Swelling  . Iohexol Other (See Comments)     Desc: pt complains of difficulty swallowing and thickened tongue/ throat closes up   . Penicillins Hives, Swelling and Other (See Comments)    Thrush   . Sulfa Antibiotics Anaphylaxis, Hives, Swelling and Other (See Comments)  . Nystatin Other (See Comments)    thrush    ROS Review of Systems  All other systems reviewed and are negative.     Objective:    Physical Exam  Constitutional: She is oriented to person, place, and time.  Obese  HENT:  Head: Normocephalic and atraumatic.  Cardiovascular: Normal rate, regular rhythm, normal heart sounds and intact distal pulses.  Pulmonary/Chest: Effort normal and breath sounds normal.  Abdominal:  Midline abdominal weakness  Musculoskeletal:        General: Normal range of motion.     Cervical back: Normal range of motion and neck supple.  Neurological: She is alert and oriented to person, place, and time.  Skin: Skin is warm and dry.  Psychiatric: She has a normal mood and affect. Her behavior is normal. Judgment and thought content normal.    BP (!) 162/70   Pulse 94   Temp 97.6 F (36.4 C)   Ht _0  (1.499 m)   Wt 187 lb (84.8 kg)   LMP 08/22/2011 Comment: patient  states LMP 20 years ago  SpO2 99%   BMI 37.77 kg/m  Wt Readings from Last 3 Encounters:  01/10/20 187 lb (84.8 kg)  09/20/19 180 lb 6.4 oz (81.8 kg)  09/17/19 177 lb 3.2 oz (80.4 kg)     Health Maintenance Due  Topic Date Due  . COVID-19 Vaccine (1) Never done  . PAP SMEAR-Modifier  10/30/2015  . FOOT EXAM  10/28/2018  . OPHTHALMOLOGY EXAM  03/30/2019  . URINE MICROALBUMIN  05/03/2019    There are no preventive care reminders to display for this patient.  Lab Results  Component Value Date   TSH 0.793 10/27/2017   Lab Results  Component Value Date   WBC 6.5 05/18/2018   HGB 14.3 05/18/2018   HCT 42.5 05/18/2018   MCV 83.7 05/18/2018   PLT 356 05/18/2018   Lab Results  Component Value Date   NA 139 01/10/2020   K 3.8 01/10/2020   CO2 25 06/04/2019   GLUCOSE 118 (H) 01/10/2020   BUN 15 01/10/2020   CREATININE 0.95 01/10/2020   BILITOT 0.3 01/10/2020   ALKPHOS 58 01/10/2020   AST 79 (H) 01/10/2020   ALT 32 06/04/2019   PROT 7.9 01/10/2020   ALBUMIN 4.8 01/10/2020   CALCIUM 9.9 01/10/2020   ANIONGAP 11 05/18/2018   Lab Results  Component Value Date   CHOL 199 01/10/2020   Lab Results  Component Value Date   HDL 34 (L) 01/10/2020   Lab Results  Component Value Date   LDLCALC 97 01/10/2020   Lab Results  Component Value Date   TRIG 403 (H) 01/10/2020   Lab Results  Component Value Date   CHOLHDL 5.9 (H) 01/10/2020   Lab Results  Component Value Date   HGBA1C 7.7 (A) 01/10/2020      Assessment & Plan:   Problem List Items Addressed  This Visit      Cardiovascular and Mediastinum   Essential hypertension     Other   Hyperlipidemia   Relevant Orders   Lipid panel (Completed)   Obesity (BMI 30-39.9)    Other Visit Diagnoses    Type 2 diabetes mellitus without complication, without long-term current use of insulin ([facility])    -  Primary   Increase of A1c from 6.3 to 7.7% patient declined increase in current Metformin.  Patient declined  sulfonylurea.  Will work on lifestyle modification    Relevant Orders   POCT glycosylated hemoglobin (Hb A1C) (Completed)   POCT URINALYSIS DIP (CLINITEK) (Completed)   Comp. Metabolic Panel (12) (Completed)      Meds ordered this encounter  Medications  . blood glucose meter kit and supplies    Sig: Dispense based on patient and insurance preference. Use up to four times daily as directed. (FOR ICD-10 E10.9, E11.9).    Dispense:  1 each    Refill:  0    Order Specific Question:   Supervising Provider    Answer:   Tresa Garter [id number]    Order Specific Question:   Number of strips    Answer:   100    Order Specific Question:   Number of lancets    Answer:   100    Follow-up: Return for Appointment As Scheduled, fasting labs within the next week.    Vevelyn Francois, NP

## 2020-01-11 LAB — COMP. METABOLIC PANEL (12)
AST: 79 IU/L — ABNORMAL HIGH (ref 0–40)
Albumin/Globulin Ratio: 1.5 (ref 1.2–2.2)
Albumin: 4.8 g/dL (ref 3.8–4.9)
Alkaline Phosphatase: 58 IU/L (ref 48–121)
BUN/Creatinine Ratio: 16 (ref 9–23)
BUN: 15 mg/dL (ref 6–24)
Bilirubin Total: 0.3 mg/dL (ref 0.0–1.2)
Calcium: 9.9 mg/dL (ref 8.7–10.2)
Chloride: 100 mmol/L (ref 96–106)
Creatinine, Ser: 0.95 mg/dL (ref 0.57–1.00)
GFR calc Af Amer: 79 mL/min/{1.73_m2} (ref >59)
GFR calc non Af Amer: 69 mL/min/{1.73_m2} (ref >59)
Globulin, Total: 3.1 g/dL (ref 1.5–4.5)
Glucose: 118 mg/dL — ABNORMAL HIGH (ref 65–99)
Potassium: 3.8 mmol/L (ref 3.5–5.2)
Sodium: 139 mmol/L (ref 134–144)
Total Protein: 7.9 g/dL (ref 6.0–8.5)

## 2020-01-11 LAB — LIPID PANEL
Chol/HDL Ratio: 5.9 ratio — ABNORMAL HIGH (ref 0.0–4.4)
Cholesterol, Total: 199 mg/dL (ref 100–199)
HDL: 34 mg/dL — ABNORMAL LOW (ref >39)
LDL Chol Calc (NIH): 97 mg/dL (ref 0–99)
Triglycerides: 403 mg/dL — ABNORMAL HIGH (ref 0–149)
VLDL Cholesterol Cal: 68 mg/dL — ABNORMAL HIGH (ref 5–40)

## 2020-01-15 NOTE — Progress Notes (Signed)
Called informed of lab results, pt said that she is taking fenofibrate 48mg  once a day and crestor 10mg   once day. She said that would like have something for weight loss, gave patient examples of ways to make modifications her diet For example low fat, low carb, small meals, baked and non-fried foods, pt has appt to come in Friday for fasting labs.

## 2020-01-17 ENCOUNTER — Other Ambulatory Visit: Payer: Medicaid Other

## 2020-01-21 DIAGNOSIS — Z20828 Contact with and (suspected) exposure to other viral communicable diseases: Secondary | ICD-10-CM | POA: Diagnosis not present

## 2020-02-01 ENCOUNTER — Other Ambulatory Visit: Payer: Self-pay | Admitting: Nurse Practitioner

## 2020-02-25 ENCOUNTER — Other Ambulatory Visit: Payer: Self-pay | Admitting: Family Medicine

## 2020-02-25 DIAGNOSIS — E119 Type 2 diabetes mellitus without complications: Secondary | ICD-10-CM

## 2020-03-16 ENCOUNTER — Other Ambulatory Visit: Payer: Self-pay

## 2020-03-16 ENCOUNTER — Ambulatory Visit (INDEPENDENT_AMBULATORY_CARE_PROVIDER_SITE_OTHER): Payer: Medicaid Other | Admitting: Nurse Practitioner

## 2020-03-16 ENCOUNTER — Encounter: Payer: Self-pay | Admitting: Nurse Practitioner

## 2020-03-16 VITALS — BP 154/84 | HR 100 | Temp 98.2°F | Resp 14 | Ht 59.0 in | Wt 181.0 lb

## 2020-03-16 DIAGNOSIS — K5909 Other constipation: Secondary | ICD-10-CM

## 2020-03-16 DIAGNOSIS — E669 Obesity, unspecified: Secondary | ICD-10-CM | POA: Diagnosis not present

## 2020-03-16 DIAGNOSIS — R252 Cramp and spasm: Secondary | ICD-10-CM

## 2020-03-16 DIAGNOSIS — I10 Essential (primary) hypertension: Secondary | ICD-10-CM

## 2020-03-16 DIAGNOSIS — E119 Type 2 diabetes mellitus without complications: Secondary | ICD-10-CM | POA: Diagnosis not present

## 2020-03-16 DIAGNOSIS — E782 Mixed hyperlipidemia: Secondary | ICD-10-CM | POA: Diagnosis not present

## 2020-03-16 DIAGNOSIS — Z0000 Encounter for general adult medical examination without abnormal findings: Secondary | ICD-10-CM | POA: Diagnosis not present

## 2020-03-16 LAB — POCT URINALYSIS DIPSTICK
Bilirubin, UA: NEGATIVE
Glucose, UA: NEGATIVE
Ketones, UA: NEGATIVE
Leukocytes, UA: NEGATIVE
Nitrite, UA: NEGATIVE
Protein, UA: NEGATIVE
Spec Grav, UA: 1.02 (ref 1.010–1.025)
Urobilinogen, UA: 0.2 E.U./dL
pH, UA: 5.5 (ref 5.0–8.0)

## 2020-03-16 LAB — POCT GLYCOSYLATED HEMOGLOBIN (HGB A1C): Hemoglobin A1C: 7.3 % — AB (ref 4.0–5.6)

## 2020-03-16 NOTE — Progress Notes (Signed)
[address] [place], [place]  [address] Phone:  [phone]   Fax:  [phone]   Established Patient Office Visit  Subjective:  Patient ID: Jessica Lawson, female    DOB: 30-Nov-1965  Age: 54 y.o. MRN: [id number]  CC:  Chief Complaint  Patient presents with  . Hypertension  . Diabetes  . Obesity    asking for something to help with weight loss   . Leg Problem    cramping in both legs     HPI Jessica Lawson presents for follow up. She  has a past medical history of Allergy, Back pain, Diabetes mellitus without complication ([place]), High blood cholesterol, Hypertension, Kidney stone, Normal cardiac stress test (02/2016), Obesity, formerTobacco use,   She admits that she has not received that COV-19. She admits that her right arm was painful.  However she denies any current vaccination problems.   She is having leg cramps. She is taking Crestor and feels like this may be relaed. She amdits that she is having "charlie horses at night'  Diabetes Mellitus Patient presents for follow up of diabetes. Current symptoms include: hyperglycemia. Symptoms have stabilized. Patient denies foot ulcerations, hypoglycemia , increased appetite, paresthesia of the feet, polydipsia, polyuria, visual disturbances and vomiting. Evaluation to date has included: fasting blood sugar, fasting lipid panel and hemoglobin A1C.  Home sugars: BGs are high in the morning. Current treatment: Continued metformin which has been somewhat effective. She admits that she is only taking Metformin 500 mg daily. Last dilated eye exam: 2020.  She admits that her current work status. She is working in [employer] care. She admits that this is stressful  Hypertension Patient is here for follow-up of elevated blood pressure. She is not walking and is tyring adherent to a low-salt diet. Blood pressure is not monitored at home. Cardiac symptoms: none. Patient denies chest pain, dyspnea, fatigue,  irregular heart beat, lower extremity edema, palpitations and syncope. Cardiovascular risk factors: diabetes mellitus, dyslipidemia, hypertension and obesity (BMI >= 30 kg/m2). Use of agents associated with hypertension: steroids. History of target organ damage: history of chest pain normal stress test . Past Medical History:  Diagnosis Date  . Allergy   . Back pain   . Diabetes mellitus without complication ([place])   . High blood cholesterol   . Hypertension   . Kidney stone   . Normal cardiac stress test 02/2016   low risk  . Obesity   . Pain of cheek    and right side neck  . Tobacco use   . UTI (lower urinary tract infection)   . Wrist pain    left wrist    Past Surgical History:  Procedure Laterality Date  . CESAREAN SECTION     3 c-sections  . DENTAL SURGERY     teeth removed with meds to calm     Family History  Problem Relation Age of Onset  . Hyperlipidemia Other   . Hypertension Other   . Diabetes Other   . Diabetes Mother   . Lung disease Father   . Diabetes Sister   . Diabetes Sister   . Hypertension Sister   . Colon cancer Neg Hx   . Colon polyps Neg Hx   . Esophageal cancer Neg Hx   . Stomach cancer Neg Hx   . Rectal cancer Neg Hx     Social History   Socioeconomic History  . Marital status: Single    Spouse name: Not on  file  . Number of children: Not on file  . Years of education: Not on file  . Highest education level: Not on file  Occupational History  . Not on file  Tobacco Use  . Smoking status: Former Smoker    Packs/day: 1.00    Types: Cigarettes    Quit date: 07/26/2016    Years since quitting: 3.6  . Smokeless tobacco: Never Used  Vaping Use  . Vaping Use: Never used  Substance and Sexual Activity  . Alcohol use: No  . Drug use: No  . Sexual activity: Not Currently  Other Topics Concern  . Not on file  Social History Narrative  . Not on file   Social Determinants of Health   Financial Resource Strain:   . Difficulty of  Paying Living Expenses:   Food Insecurity:   . Worried About Charity fundraiser in the Last Year:   . Arboriculturist in the Last Year:   Transportation Needs:   . Film/video editor (Medical):   Marland Kitchen Lack of Transportation (Non-Medical):   Physical Activity:   . Days of Exercise per Week:   . Minutes of Exercise per Session:   Stress:   . Feeling of Stress :   Social Connections:   . Frequency of Communication with Friends and Family:   . Frequency of Social Gatherings with Friends and Family:   . Attends Religious Services:   . Active Member of Clubs or Organizations:   . Attends Archivist Meetings:   Marland Kitchen Marital Status:   Intimate Partner Violence:   . Fear of Current or Ex-Partner:   . Emotionally Abused:   Marland Kitchen Physically Abused:   . Sexually Abused:     Outpatient Medications Prior to Visit  Medication Sig Dispense Refill  . ACCU-CHEK GUIDE test strip USE AS DIRECTED FOUR TIMES DAILY. 100 strip 3  . Accu-Chek Softclix Lancets lancets USE AS DIRECTED FOUR TIMES DAILY. 100 each 3  . blood glucose meter kit and supplies KIT Dispense based on patient and insurance preference. Use up to four times daily as directed. (FOR ICD-9 250.00, 250.01). 1 each 0  . blood glucose meter kit and supplies Dispense based on patient and insurance preference. Use up to four times daily as directed. (FOR ICD-10 E10.9, E11.9). 1 each 0  . cetirizine (ZYRTEC) 10 MG tablet TAKE 1 TABLET(10 MG) BY MOUTH DAILY 30 tablet 11  . fenofibrate (TRICOR) 48 MG tablet Take 1 tablet (48 mg total) by mouth daily. 30 tablet 5  . fluticasone (FLONASE) 50 MCG/ACT nasal spray Place 2 sprays into both nostrils daily. 16 g 6  . hydrochlorothiazide (HYDRODIURIL) 25 MG tablet Take 1 tablet (25 mg total) by mouth daily. 90 tablet 1  . loratadine (CLARITIN) 10 MG tablet Take 1 tablet (10 mg total) by mouth daily. 30 tablet 11  . metFORMIN (GLUCOPHAGE) 500 MG tablet TAKE 1 TABLET(500 MG) BY MOUTH TWICE DAILY WITH A  MEAL 180 tablet 1  . methocarbamol (ROBAXIN) 500 MG tablet Take 1 tablet (500 mg total) by mouth 2 (two) times daily as needed for muscle spasms. 30 tablet 2  . Multiple Vitamins-Minerals (MULTIVITAMIN WITH MINERALS) tablet Take 1 tablet by mouth daily.    Marland Kitchen omega-3 acid ethyl esters (LOVAZA) 1 g capsule TAKE 1 CAPSULE BY MOUTH EVERY DAY 90 capsule 1  . ondansetron (ZOFRAN) 4 MG tablet Take 1 tablet (4 mg total) by mouth every 8 (eight) hours as needed for  nausea or vomiting. 30 tablet 1  . PROAIR HFA 108 (90 Base) MCG/ACT inhaler INHALE 2 PUFFS INTO THE LUNGS EVERY 4 HOURS AS NEEDED FOR WHEEZING OR SHORTNESS OF BREATH 8.5 g 3  . rosuvastatin (CRESTOR) 10 MG tablet Take 10 mg by mouth daily.    . valACYclovir (VALTREX) 1000 MG tablet Take 1 tablet (1,000 mg total) by mouth 2 (two) times daily. 20 tablet 0   No facility-administered medications prior to visit.    Allergies  Allergen Reactions  . Asa [Aspirin] Anaphylaxis, Hives and Swelling  . Iohexol Other (See Comments)     Desc: pt complains of difficulty swallowing and thickened tongue/ throat closes up   . Penicillins Hives, Swelling and Other (See Comments)    Thrush   . Sulfa Antibiotics Anaphylaxis, Hives, Swelling and Other (See Comments)  . Nystatin Other (See Comments)    thrush    ROS Review of Systems  Constitutional: Negative.   HENT: Negative.   Eyes: Negative.   Respiratory: Negative.   Cardiovascular: Negative.   Gastrointestinal: Positive for constipation and nausea.  Endocrine: Negative.   Genitourinary: Negative.   Neurological: Positive for dizziness and headaches.      Objective:    Physical Exam Constitutional:      Appearance: She is obese.  HENT:     Head: Normocephalic.     Nose: Nose normal.     Mouth/Throat:     Mouth: Mucous membranes are moist.  Cardiovascular:     Rate and Rhythm: Normal rate and regular rhythm.     Pulses: Normal pulses.          Dorsalis pedis pulses are 2+ on the  right side and 2+ on the left side.       Posterior tibial pulses are 2+ on the right side and 2+ on the left side.     Heart sounds: Normal heart sounds.  Pulmonary:     Effort: Pulmonary effort is normal.     Breath sounds: Normal breath sounds.  Abdominal:     Palpations: Abdomen is soft.  Musculoskeletal:        General: Normal range of motion.     Cervical back: Normal range of motion.  Feet:     Right foot:     Protective Sensation: 10 sites tested. 10 sites sensed.     Skin integrity: Skin integrity normal.     Toenail Condition: Right toenails are normal.     Left foot:     Protective Sensation: 10 sites tested. 10 sites sensed.     Skin integrity: Skin integrity normal.     Toenail Condition: Left toenails are normal.  Skin:    General: Skin is warm and dry.     Capillary Refill: Capillary refill takes less than 2 seconds.  Neurological:     General: No focal deficit present.     Mental Status: She is alert and oriented to person, place, and time.  Psychiatric:        Mood and Affect: Mood normal.        Behavior: Behavior normal.        Thought Content: Thought content normal.        Judgment: Judgment normal.     BP (!) 154/84 (BP Location: Left Arm, Patient Position: Sitting, Cuff Size: Normal) Comment: manually  Pulse 100   Temp 98.2 F (36.8 C) (Oral)   Resp 14   Ht 4' 11" (1.499 m)   Wt 181  lb (82.1 kg)   LMP 08/22/2011 Comment: patient states LMP 20 years ago  SpO2 100%   BMI 36.56 kg/m  Wt Readings from Last 3 Encounters:  03/16/20 181 lb (82.1 kg)  01/10/20 187 lb (84.8 kg)  09/20/19 180 lb 6.4 oz (81.8 kg)     Health Maintenance Due  Topic Date Due  . URINE MICROALBUMIN  05/03/2019    There are no preventive care reminders to display for this patient.  Lab Results  Component Value Date   TSH 0.793 10/27/2017   Lab Results  Component Value Date   WBC 6.5 05/18/2018   HGB 14.3 05/18/2018   HCT 42.5 05/18/2018   MCV 83.7 05/18/2018     PLT 356 05/18/2018   Lab Results  Component Value Date   NA 139 01/10/2020   K 3.8 01/10/2020   CO2 25 06/04/2019   GLUCOSE 118 (H) 01/10/2020   BUN 15 01/10/2020   CREATININE 0.95 01/10/2020   BILITOT 0.3 01/10/2020   ALKPHOS 58 01/10/2020   AST 79 (H) 01/10/2020   ALT 32 06/04/2019   PROT 7.9 01/10/2020   ALBUMIN 4.8 01/10/2020   CALCIUM 9.9 01/10/2020   ANIONGAP 11 05/18/2018   Lab Results  Component Value Date   CHOL 199 01/10/2020   Lab Results  Component Value Date   HDL 34 (L) 01/10/2020   Lab Results  Component Value Date   LDLCALC 97 01/10/2020   Lab Results  Component Value Date   TRIG 403 (H) 01/10/2020   Lab Results  Component Value Date   CHOLHDL 5.9 (H) 01/10/2020   Lab Results  Component Value Date   HGBA1C 7.3 (A) 03/16/2020      Assessment & Plan:   Problem List Items Addressed This Visit      Cardiovascular and Mediastinum   Essential hypertension Encouraged on going compliance with current medication regimen Encouraged home monitoring and recording BP <130/80 Eating a heart-healthy diet with less salt Encouraged regular physical activity  Recommend Weight loss    Relevant Orders   Urinalysis Dipstick (Completed)     Other   Obesity (BMI 30-39.9)   Hyperlipidemia Patient complaining of leg cramps at night it maybe related to Crestor. Will have patient try Crestor 10 mg QOD to see if this will help if labs are wnl. Pt to follow up on Friday to have Fasting lipid panel completed   Relevant Orders   Lipid panel    Other Visit Diagnoses    Type 2 diabetes mellitus without complication, without long-term current use of insulin ([place])    -  Primary Encourage compliance with current treatment regimen  Dose adjustment increased Metformin 511m BID  Encourage regular CBG monitoring Encourage contacting office if excessive hyperglycemia and or hypoglycemia Lifestyle modification with healthy diet (fewer calories, more high fiber  foods, whole grains and non-starchy vegetables, lower fat meat and fish, low-fat diary include healthy oils) regular exercise (physical activity) and weight loss Opthalmology exam discussed  Nutritional consult recommended Regular dental visits encouraged Home BP monitoring also encouraged goal <130/80    Relevant Orders   Urinalysis Dipstick (Completed)   HgB A1c (Completed)   Comp. Metabolic Panel (12)   Microalbumin / creatinine urine ratio   Microalbumin, urine   Leg cramps        Relevant Orders   Magnesium   Vitamin B12   Other constipation       Healthcare maintenance       Relevant Orders  CBC with Differential/Platelet   TSH   Hepatitis C antibody      No orders of the defined types were placed in this encounter.   Follow-up: Return in about 3 months (around 06/16/2020) for lab only apt on Friday orders are in.    Vevelyn Francois, NP

## 2020-03-16 NOTE — Patient Instructions (Signed)
Leg Cramps Leg cramps occur when one or more muscles tighten and you have no control over this tightening (involuntary muscle contraction). Muscle cramps can develop in any muscle, but the most common place is in the calf muscles of the leg. Those cramps can occur during exercise or when you are at rest. Leg cramps are painful, and they may last for a few seconds to a few minutes. Cramps may return several times before they finally stop. Usually, leg cramps are not caused by a serious medical problem. In many cases, the cause is not known. Some common causes include:  Excessive physical effort (overexertion), such as during intense exercise.  Overuse from repetitive motions, or doing the same thing over and over.  Staying in a certain position for a long period of time.  Improper preparation, form, or technique while performing a sport or an activity.  Dehydration.  Injury.  Side effects of certain medicines.  Abnormally low levels of minerals in your blood (electrolytes), especially potassium and calcium. This could result from: ? Pregnancy. ? Taking diuretic medicines. Follow these instructions at home: Eating and drinking  Drink enough fluid to keep your urine pale yellow. Staying hydrated may help prevent cramps.  Eat a healthy diet that includes plenty of nutrients to help your muscles function. A healthy diet includes fruits and vegetables, lean protein, whole grains, and low-fat or nonfat dairy products. Managing pain, stiffness, and swelling      Try massaging, stretching, and relaxing the affected muscle. Do this for several minutes at a time.  If directed, put ice on areas that are sore or painful after a cramp: ? Put ice in a plastic bag. ? Place a towel between your skin and the bag. ? Leave the ice on for 20 minutes, 2-3 times a day.  If directed, apply heat to muscles that are tense or tight. Do this before you exercise, or as often as told by your health care  provider. Use the heat source that your health care provider recommends, such as a moist heat pack or a heating pad. ? Place a towel between your skin and the heat source. ? Leave the heat on for 20-30 minutes. ? Remove the heat if your skin turns bright red. This is especially important if you are unable to feel pain, heat, or cold. You may have a greater risk of getting burned.  Try taking hot showers or baths to help relax tight muscles. General instructions  If you are having frequent leg cramps, avoid intense exercise for several days.  Take over-the-counter and prescription medicines only as told by your health care provider.  Keep all follow-up visits as told by your health care provider. This is important. Contact a health care provider if:  Your leg cramps get more severe or more frequent, or they do not improve over time.  Your foot becomes cold, numb, or blue. Summary  Muscle cramps can develop in any muscle, but the most common place is in the calf muscles of the leg.  Leg cramps are painful, and they may last for a few seconds to a few minutes.  Usually, leg cramps are not caused by a serious medical problem. Often, the cause is not known.  Stay hydrated and take over-the-counter and prescription medicines only as told by your health care provider. This information is not intended to replace advice given to you by your health care provider. Make sure you discuss any questions you have with your health care  provider. Document Revised: 07/14/2017 Document Reviewed: 05/11/2017 [employer] Patient Education  2020 [employer]  

## 2020-03-17 LAB — CBC WITH DIFFERENTIAL/PLATELET
Basophils Absolute: 0 10*3/uL (ref 0.0–0.2)
Basos: 1 %
EOS (ABSOLUTE): 0.1 10*3/uL (ref 0.0–0.4)
Eos: 1 %
Hematocrit: 43.7 % (ref 34.0–46.6)
Hemoglobin: 14.5 g/dL (ref 11.1–15.9)
Immature Grans (Abs): 0 10*3/uL (ref 0.0–0.1)
Immature Granulocytes: 0 %
Lymphocytes Absolute: 3.2 10*3/uL — ABNORMAL HIGH (ref 0.7–3.1)
Lymphs: 56 %
MCH: 27.5 pg (ref 26.6–33.0)
MCHC: 33.2 g/dL (ref 31.5–35.7)
MCV: 83 fL (ref 79–97)
Monocytes Absolute: 0.5 10*3/uL (ref 0.1–0.9)
Monocytes: 8 %
Neutrophils Absolute: 1.9 10*3/uL (ref 1.4–7.0)
Neutrophils: 34 %
Platelets: 338 10*3/uL (ref 150–450)
RBC: 5.27 x10E6/uL (ref 3.77–5.28)
RDW: 13.3 % (ref 11.7–15.4)
WBC: 5.6 10*3/uL (ref 3.4–10.8)

## 2020-03-17 LAB — COMP. METABOLIC PANEL (12)
AST: 25 IU/L (ref 0–40)
Albumin/Globulin Ratio: 1.4 (ref 1.2–2.2)
Albumin: 4.6 g/dL (ref 3.8–4.9)
Alkaline Phosphatase: 53 IU/L (ref 48–121)
BUN/Creatinine Ratio: 19 (ref 9–23)
BUN: 16 mg/dL (ref 6–24)
Bilirubin Total: 0.3 mg/dL (ref 0.0–1.2)
Calcium: 10 mg/dL (ref 8.7–10.2)
Chloride: 99 mmol/L (ref 96–106)
Creatinine, Ser: 0.86 mg/dL (ref 0.57–1.00)
GFR calc Af Amer: 89 mL/min/{1.73_m2} (ref >59)
GFR calc non Af Amer: 77 mL/min/{1.73_m2} (ref >59)
Globulin, Total: 3.2 g/dL (ref 1.5–4.5)
Glucose: 182 mg/dL — ABNORMAL HIGH (ref 65–99)
Potassium: 3.7 mmol/L (ref 3.5–5.2)
Sodium: 139 mmol/L (ref 134–144)
Total Protein: 7.8 g/dL (ref 6.0–8.5)

## 2020-03-17 LAB — VITAMIN B12: Vitamin B-12: 894 pg/mL (ref 232–1245)

## 2020-03-17 LAB — MICROALBUMIN, URINE: Microalbumin, Urine: 11.4 ug/mL

## 2020-03-17 LAB — MAGNESIUM: Magnesium: 1.7 mg/dL (ref 1.6–2.3)

## 2020-03-17 LAB — HEPATITIS C ANTIBODY: Hep C Virus Ab: <0.1 s/co ratio (ref 0.0–0.9)

## 2020-03-17 LAB — TSH: TSH: 0.605 u[IU]/mL (ref 0.450–4.500)

## 2020-03-18 LAB — SPECIMEN STATUS REPORT

## 2020-03-18 LAB — LIPID PANEL
Chol/HDL Ratio: 5.1 ratio — ABNORMAL HIGH (ref 0.0–4.4)
Cholesterol, Total: 198 mg/dL (ref 100–199)
HDL: 39 mg/dL — ABNORMAL LOW (ref >39)
LDL Chol Calc (NIH): 118 mg/dL — ABNORMAL HIGH (ref 0–99)
Triglycerides: 236 mg/dL — ABNORMAL HIGH (ref 0–149)
VLDL Cholesterol Cal: 41 mg/dL — ABNORMAL HIGH (ref 5–40)

## 2020-04-15 DIAGNOSIS — Z20822 Contact with and (suspected) exposure to covid-19: Secondary | ICD-10-CM | POA: Diagnosis not present

## 2020-04-16 ENCOUNTER — Ambulatory Visit: Payer: Medicaid Other | Admitting: Nurse Practitioner

## 2020-04-22 ENCOUNTER — Other Ambulatory Visit: Payer: Self-pay

## 2020-04-22 ENCOUNTER — Encounter: Payer: Self-pay | Admitting: Nurse Practitioner

## 2020-04-22 ENCOUNTER — Ambulatory Visit (INDEPENDENT_AMBULATORY_CARE_PROVIDER_SITE_OTHER): Payer: Medicaid Other | Admitting: Nurse Practitioner

## 2020-04-22 VITALS — BP 141/79 | HR 72 | Temp 97.4°F | Ht 59.0 in | Wt 184.0 lb

## 2020-04-22 DIAGNOSIS — I10 Essential (primary) hypertension: Secondary | ICD-10-CM

## 2020-04-22 DIAGNOSIS — R110 Nausea: Secondary | ICD-10-CM

## 2020-04-22 DIAGNOSIS — E119 Type 2 diabetes mellitus without complications: Secondary | ICD-10-CM

## 2020-04-22 DIAGNOSIS — Z0184 Encounter for antibody response examination: Secondary | ICD-10-CM | POA: Diagnosis not present

## 2020-04-22 MED ORDER — ONDANSETRON HCL 4 MG PO TABS: 4.0000 mg | ORAL_TABLET | Freq: Three times a day (TID) | ORAL | 1 refills | Status: DC | PRN

## 2020-04-22 NOTE — Progress Notes (Signed)
[place], [place]  [address] Phone:  [phone]   Fax:  [phone]    Established Patient Office Visit  Subjective:  Patient ID: Jessica Lawson, female    DOB: 28-Nov-1965  Age: 54 y.o. MRN: [id number]  CC:  Chief Complaint  Patient presents with   Follow-up    HPI Jessica Lawson presents for follow up. She  has a past medical history of Allergy, Back pain, Diabetes mellitus without complication ([place]), High blood cholesterol, Hypertension, Kidney stone, Normal cardiac stress test (02/2016), Obesity, Pain of cheek, Tobacco use, UTI (lower urinary tract infection), and Wrist pain.   She is in today to have her school physical completed.  Past Medical History:  Diagnosis Date   Allergy    Back pain    Diabetes mellitus without complication (HCC)    High blood cholesterol    Hypertension    Kidney stone    Normal cardiac stress test 02/2016   low risk   Obesity    Pain of cheek    and right side neck   Tobacco use    UTI (lower urinary tract infection)    Wrist pain    left wrist    Past Surgical History:  Procedure Laterality Date   CESAREAN SECTION     3 c-sections   DENTAL SURGERY     teeth removed with meds to calm     Family History  Problem Relation Age of Onset   Hyperlipidemia Other    Hypertension Other    Diabetes Other    Diabetes Mother    Lung disease Father    Diabetes Sister    Diabetes Sister    Hypertension Sister    Colon cancer Neg Hx    Colon polyps Neg Hx    Esophageal cancer Neg Hx    Stomach cancer Neg Hx    Rectal cancer Neg Hx     Social History   Socioeconomic History   Marital status: Single    Spouse name: Not on file   Number of children: Not on file   Years of education: Not on file   Highest education level: Not on file  Occupational History   Not on file  Tobacco Use   Smoking status: Former Smoker    Packs/day: 1.00    Types:  Cigarettes    Quit date: 07/26/2016    Years since quitting: 3.7   Smokeless tobacco: Never Used  Vaping Use   Vaping Use: Never used  Substance and Sexual Activity   Alcohol use: No   Drug use: No   Sexual activity: Not Currently  Other Topics Concern   Not on file  Social History Narrative   Not on file   Social Determinants of Health   Financial Resource Strain:    Difficulty of Paying Living Expenses: Not on file  Food Insecurity:    Worried About Charity fundraiser in the Last Year: Not on file   [employer] of Food in the Last Year: Not on file  Transportation Needs:    Lack of Transportation (Medical): Not on file   Lack of Transportation (Non-Medical): Not on file  Physical Activity:    Days of Exercise per Week: Not on file   Minutes of Exercise per Session: Not on file  Stress:    Feeling of Stress : Not on file  Social Connections:    Frequency of Communication with Friends and Family:  Not on file   Frequency of Social Gatherings with Friends and Family: Not on file   Attends Religious Services: Not on file   Active Member of Clubs or Organizations: Not on file   Attends Archivist Meetings: Not on file   Marital Status: Not on file  Intimate Partner Violence:    Fear of Current or Ex-Partner: Not on file   Emotionally Abused: Not on file   Physically Abused: Not on file   Sexually Abused: Not on file    Outpatient Medications Prior to Visit  Medication Sig Dispense Refill   ACCU-CHEK GUIDE test strip USE AS DIRECTED FOUR TIMES DAILY. 100 strip 3   Accu-Chek Softclix Lancets lancets USE AS DIRECTED FOUR TIMES DAILY. 100 each 3   blood glucose meter kit and supplies KIT Dispense based on patient and insurance preference. Use up to four times daily as directed. (FOR ICD-9 250.00, 250.01). 1 each 0   blood glucose meter kit and supplies Dispense based on patient and insurance preference. Use up to four times daily as  directed. (FOR ICD-10 E10.9, E11.9). 1 each 0   cetirizine (ZYRTEC) 10 MG tablet TAKE 1 TABLET(10 MG) BY MOUTH DAILY 30 tablet 11   fenofibrate (TRICOR) 48 MG tablet Take 1 tablet (48 mg total) by mouth daily. 30 tablet 5   fluticasone (FLONASE) 50 MCG/ACT nasal spray Place 2 sprays into both nostrils daily. 16 g 6   hydrochlorothiazide (HYDRODIURIL) 25 MG tablet Take 1 tablet (25 mg total) by mouth daily. 90 tablet 1   loratadine (CLARITIN) 10 MG tablet Take 1 tablet (10 mg total) by mouth daily. 30 tablet 11   metFORMIN (GLUCOPHAGE) 500 MG tablet TAKE 1 TABLET(500 MG) BY MOUTH TWICE DAILY WITH A MEAL 180 tablet 1   methocarbamol (ROBAXIN) 500 MG tablet Take 1 tablet (500 mg total) by mouth 2 (two) times daily as needed for muscle spasms. 30 tablet 2   Multiple Vitamins-Minerals (MULTIVITAMIN WITH MINERALS) tablet Take 1 tablet by mouth daily.     omega-3 acid ethyl esters (LOVAZA) 1 g capsule TAKE 1 CAPSULE BY MOUTH EVERY DAY 90 capsule 1   PROAIR HFA 108 (90 Base) MCG/ACT inhaler INHALE 2 PUFFS INTO THE LUNGS EVERY 4 HOURS AS NEEDED FOR WHEEZING OR SHORTNESS OF BREATH 8.5 g 3   rosuvastatin (CRESTOR) 10 MG tablet Take 10 mg by mouth daily.     valACYclovir (VALTREX) 1000 MG tablet Take 1 tablet (1,000 mg total) by mouth 2 (two) times daily. 20 tablet 0   ondansetron (ZOFRAN) 4 MG tablet Take 1 tablet (4 mg total) by mouth every 8 (eight) hours as needed for nausea or vomiting. (Patient not taking: Reported on 04/22/2020) 30 tablet 1   No facility-administered medications prior to visit.    Allergies  Allergen Reactions   Asa [Aspirin] Anaphylaxis, Hives and Swelling   Iohexol Other (See Comments)     Desc: pt complains of difficulty swallowing and thickened tongue/ throat closes up    Penicillins Hives, Swelling and Other (See Comments)    Thrush    Sulfa Antibiotics Anaphylaxis, Hives, Swelling and Other (See Comments)   Nystatin Other (See Comments)    thrush     ROS Review of Systems    Objective:    Physical Exam Constitutional:      General: She is not in acute distress.    Appearance: She is obese. She is not ill-appearing, toxic-appearing or diaphoretic.  HENT:  Head: Normocephalic and atraumatic.     Nose: Nose normal.     Mouth/Throat:     Mouth: Mucous membranes are moist.  Cardiovascular:     Rate and Rhythm: Normal rate and regular rhythm.     Pulses: Normal pulses.     Heart sounds: Normal heart sounds.  Pulmonary:     Effort: Pulmonary effort is normal.     Breath sounds: Normal breath sounds.  Musculoskeletal:        General: Normal range of motion.     Cervical back: Normal range of motion.  Skin:    General: Skin is warm and dry.     Capillary Refill: Capillary refill takes less than 2 seconds.  Neurological:     General: No focal deficit present.     Mental Status: She is alert and oriented to person, place, and time.  Psychiatric:        Mood and Affect: Mood normal.        Behavior: Behavior normal.        Thought Content: Thought content normal.        Judgment: Judgment normal.     BP (!) 141/79    Pulse 72    Temp (!) 97.4 F (36.3 C) (Temporal)    Ht $R'4\' 11"'Ya$  (1.499 m)    Wt 184 lb (83.5 kg)    LMP 08/22/2011 Comment: patient states LMP 20 years ago   SpO2 99%    BMI 37.16 kg/m  Wt Readings from Last 3 Encounters:  04/22/20 184 lb (83.5 kg)  03/16/20 181 lb (82.1 kg)  01/10/20 187 lb (84.8 kg)     Health Maintenance Due  Topic Date Due   PAP SMEAR-Modifier  10/30/2015   OPHTHALMOLOGY EXAM  03/29/2020    There are no preventive care reminders to display for this patient.  Lab Results  Component Value Date   TSH 0.605 03/16/2020   Lab Results  Component Value Date   WBC 5.6 03/16/2020   HGB 14.5 03/16/2020   HCT 43.7 03/16/2020   MCV 83 03/16/2020   PLT 338 03/16/2020   Lab Results  Component Value Date   NA 139 03/16/2020   K 3.7 03/16/2020   CO2 25 06/04/2019   GLUCOSE  182 (H) 03/16/2020   BUN 16 03/16/2020   CREATININE 0.86 03/16/2020   BILITOT 0.3 03/16/2020   ALKPHOS 53 03/16/2020   AST 25 03/16/2020   ALT 32 06/04/2019   PROT 7.8 03/16/2020   ALBUMIN 4.6 03/16/2020   CALCIUM 10.0 03/16/2020   ANIONGAP 11 05/18/2018   Lab Results  Component Value Date   CHOL 198 03/16/2020   Lab Results  Component Value Date   HDL 39 (L) 03/16/2020   Lab Results  Component Value Date   LDLCALC 118 (H) 03/16/2020   Lab Results  Component Value Date   TRIG 236 (H) 03/16/2020   Lab Results  Component Value Date   CHOLHDL 5.1 (H) 03/16/2020   Lab Results  Component Value Date   HGBA1C 7.3 (A) 03/16/2020      Assessment & Plan:   Problem List Items Addressed This Visit      Cardiovascular and Mediastinum   Essential hypertension Encouraged on going compliance with current medication regimen Encouraged home monitoring and recording BP <130/80 Eating a heart-healthy diet with less salt Encouraged regular physical activity  Recommend Weight loss       Other Visit Diagnoses    Type 2 diabetes mellitus  without complication, without long-term current use of insulin (HCC)    -  Primary Encourage compliance with current treatment regimen  Pt declines any changes in regimen Encourage regular CBG monitoring Encourage contacting office if excessive hyperglycemia and or hypoglycemia Lifestyle modification with healthy diet (fewer calories, more high fiber foods, whole grains and non-starchy vegetables, lower fat meat and fish, low-fat diary include healthy oils) regular exercise (physical activity) and weight loss Opthalmology exam discussed  Nutritional consult recommended Regular dental visits encouraged Home BP monitoring also encouraged goal <130/80     Nausea       Relevant Medications   ondansetron (ZOFRAN) 4 MG tablet    Immunity status testing pending   Meds ordered this encounter  Medications   ondansetron (ZOFRAN) 4 MG tablet     Sig: Take 1 tablet (4 mg total) by mouth every 8 (eight) hours as needed for nausea or vomiting.    Dispense:  30 tablet    Refill:  1    Follow-up: No follow-ups on file.    Vevelyn Francois, NP

## 2020-04-24 ENCOUNTER — Other Ambulatory Visit: Payer: Medicaid Other

## 2020-04-24 ENCOUNTER — Other Ambulatory Visit: Payer: Self-pay

## 2020-04-24 DIAGNOSIS — Z0184 Encounter for antibody response examination: Secondary | ICD-10-CM

## 2020-04-24 DIAGNOSIS — E119 Type 2 diabetes mellitus without complications: Secondary | ICD-10-CM

## 2020-04-27 LAB — QUANTIFERON-TB GOLD PLUS
QuantiFERON Mitogen Value: >10 IU/mL
QuantiFERON Nil Value: 0.05 IU/mL
QuantiFERON TB1 Ag Value: 0.04 IU/mL
QuantiFERON TB2 Ag Value: 0.05 IU/mL
QuantiFERON-TB Gold Plus: NEGATIVE

## 2020-04-27 LAB — MEASLES/MUMPS/RUBELLA IMMUNITY
MUMPS ABS, IGG: 192 AU/mL (ref >10.9)
RUBEOLA AB, IGG: >300 AU/mL (ref >16.4)
Rubella Antibodies, IGG: 1.34 index (ref >0.99)

## 2020-04-27 LAB — VARICELLA ZOSTER ANTIBODY, IGG: Varicella zoster IgG: >4000 index (ref >165)

## 2020-05-01 ENCOUNTER — Encounter: Payer: Self-pay | Admitting: Nurse Practitioner

## 2020-05-07 ENCOUNTER — Other Ambulatory Visit: Payer: Self-pay | Admitting: Internal Medicine

## 2020-05-12 ENCOUNTER — Other Ambulatory Visit: Payer: Self-pay

## 2020-05-12 MED ORDER — ROSUVASTATIN CALCIUM 10 MG PO TABS: 10.0000 mg | ORAL_TABLET | Freq: Every day | ORAL | 2 refills | Status: DC

## 2020-05-15 ENCOUNTER — Ambulatory Visit (INDEPENDENT_AMBULATORY_CARE_PROVIDER_SITE_OTHER): Payer: Medicaid Other | Admitting: Internal Medicine

## 2020-05-15 ENCOUNTER — Encounter: Payer: Self-pay | Admitting: Internal Medicine

## 2020-05-15 ENCOUNTER — Other Ambulatory Visit: Payer: Self-pay

## 2020-05-15 VITALS — BP 125/60 | HR 74 | Temp 96.8°F | Ht 59.0 in | Wt 185.2 lb

## 2020-05-15 DIAGNOSIS — E782 Mixed hyperlipidemia: Secondary | ICD-10-CM | POA: Diagnosis not present

## 2020-05-15 DIAGNOSIS — R0789 Other chest pain: Secondary | ICD-10-CM

## 2020-05-15 DIAGNOSIS — I10 Essential (primary) hypertension: Secondary | ICD-10-CM

## 2020-05-15 DIAGNOSIS — E669 Obesity, unspecified: Secondary | ICD-10-CM | POA: Diagnosis not present

## 2020-05-15 DIAGNOSIS — Z789 Other specified health status: Secondary | ICD-10-CM

## 2020-05-15 MED ORDER — ROSUVASTATIN CALCIUM 10 MG PO TABS: 10.0000 mg | ORAL_TABLET | ORAL | 2 refills | Status: DC

## 2020-05-15 NOTE — Patient Instructions (Signed)
Medication Instructions:  No Changes In Medications at this time.  *If you need a refill on your cardiac medications before your next appointment, please call your pharmacy*  Lab Work: None Ordered At This Time.  If you have labs (blood work) drawn today and your tests are completely normal, you will receive your results only by: Marland Kitchen MyChart Message (if you have MyChart) OR . A paper copy in the mail If you have any lab test that is abnormal or we need to change your treatment, we will call you to review the results.  Testing/Procedures: None Ordered At This Time.   Follow-Up: At [facility], you and your health needs are our priority.  As part of our continuing mission to provide you with exceptional heart care, we have created designated Provider Care Teams.  These Care Teams include your primary Cardiologist (physician) and Advanced Practice Providers (APPs -  Physician Assistants and Nurse Practitioners) who all work together to provide you with the care you need, when you need it.  Your next appointment:   6 month(s)  The format for your next appointment:   In Person  Provider:   Cherlynn Kaiser, MD  Other Instructions NEEDS FOLLOW UP WITH [facility]- NEXT AVAILABLE

## 2020-05-15 NOTE — Progress Notes (Signed)
Cardiology Office Note:    Date:  05/15/2020   ID:  Jessica Lawson, DOB November 08, 1965, MRN [id number]  PCP:  Vevelyn Francois, NP  Cardiologist:  Elouise Munroe, MD  Electrophysiologist:  None   Referring MD: Vevelyn Francois, NP   Chief Complaint/Reason for Referral: Follow-up chest pain, hyperlipidemia, diabetes  History of Present Illness:    Jessica Lawson is a 54 y.o. female with a history of DM 2 on Metformin, hyperlipidemia/hypertriglyceridemia, hypertension.  I last visited with the patient May 2020, and since then she has been seen by my partner Dr. Harrell Gave as well as Dr. Debara Pickett.  We referred the patient to Dr. Debara Pickett for lipid management.  She has had no recurrence of chest pain.  She does have some shortness of breath which she attributes to weight gain.  When she loses weight this improves.  For mixed hyperlipidemia, Crestor 20 mg daily was trialed in addition to fenofibrate.  She had myalgias, fibrate dose decreased and this improved.  She was unable to tolerate Crestor 20 mg daily, and had difficulty with 10 mg daily as well.  She is now on 10 mg of Crestor 3 times a week.  She will occasionally have left ankle discomfort but overall is feeling well.  Last lipid panel August 2021: HDL 39, triglycerides 236, LDL 118.  Triglycerides have improved since May, but LDL has increased again.  This may be due to decreased dose of Crestor.  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  She finishes her program in December.   Past Medical History:  Diagnosis Date  . Allergy   . Back pain   . Diabetes mellitus without complication ([place])   . High blood cholesterol   . Hypertension   . Kidney stone   . Normal cardiac stress test 02/2016   low risk  . Obesity   . Pain of cheek    and right side neck  . Tobacco use   . UTI (lower urinary tract infection)   . Wrist pain    left wrist    Past Surgical History:  Procedure  Laterality Date  . CESAREAN SECTION     3 c-sections  . DENTAL SURGERY     teeth removed with meds to calm     Current Medications: Current Meds  Medication Sig  . ACCU-CHEK GUIDE test strip USE AS DIRECTED FOUR TIMES DAILY.  Marland Kitchen Accu-Chek Softclix Lancets lancets USE AS DIRECTED FOUR TIMES DAILY.  . blood glucose meter kit and supplies KIT Dispense based on patient and insurance preference. Use up to four times daily as directed. (FOR ICD-9 250.00, 250.01).  . blood glucose meter kit and supplies Dispense based on patient and insurance preference. Use up to four times daily as directed. (FOR ICD-10 E10.9, E11.9).  . cetirizine (ZYRTEC) 10 MG tablet TAKE 1 TABLET(10 MG) BY MOUTH DAILY  . fenofibrate (TRICOR) 48 MG tablet Take 1 tablet (48 mg total) by mouth daily.  . fluticasone (FLONASE) 50 MCG/ACT nasal spray Place 2 sprays into both nostrils daily.  . hydrochlorothiazide (HYDRODIURIL) 25 MG tablet Take 1 tablet (25 mg total) by mouth daily.  Marland Kitchen loratadine (CLARITIN) 10 MG tablet Take 1 tablet (10 mg total) by mouth daily.  . metFORMIN (GLUCOPHAGE) 500 MG tablet TAKE 1 TABLET(500 MG) BY MOUTH TWICE DAILY WITH A MEAL  . methocarbamol (ROBAXIN) 500 MG tablet Take 1 tablet (500 mg total) by mouth 2 (two) times  daily as needed for muscle spasms.  . Multiple Vitamins-Minerals (MULTIVITAMIN WITH MINERALS) tablet Take 1 tablet by mouth daily.  Marland Kitchen omega-3 acid ethyl esters (LOVAZA) 1 g capsule TAKE 1 CAPSULE BY MOUTH EVERY DAY  . ondansetron (ZOFRAN) 4 MG tablet Take 1 tablet (4 mg total) by mouth every 8 (eight) hours as needed for nausea or vomiting.  Marland Kitchen PROAIR HFA 108 (90 Base) MCG/ACT inhaler INHALE 2 PUFFS INTO THE LUNGS EVERY 4 HOURS AS NEEDED FOR WHEEZING OR SHORTNESS OF BREATH  . rosuvastatin (CRESTOR) 10 MG tablet Take 1 tablet (10 mg total) by mouth 3 (three) times a week.  . valACYclovir (VALTREX) 1000 MG tablet Take 1 tablet (1,000 mg total) by mouth 2 (two) times daily.  . [DISCONTINUED]  rosuvastatin (CRESTOR) 10 MG tablet Take 1 tablet (10 mg total) by mouth daily.     Allergies:   Asa [aspirin], Iohexol, Penicillins, Sulfa antibiotics, and Nystatin   Social History   Tobacco Use  . Smoking status: Former Smoker    Packs/day: 1.00    Types: Cigarettes    Quit date: 07/26/2016    Years since quitting: 3.8  . Smokeless tobacco: Never Used  Vaping Use  . Vaping Use: Never used  Substance Use Topics  . Alcohol use: No  . Drug use: No     Family History: The patient's family history includes Diabetes in her mother, sister, sister, and another family member; Hyperlipidemia in an other family member; Hypertension in her sister and another family member; Lung disease in her father. There is no history of Colon cancer, Colon polyps, Esophageal cancer, Stomach cancer, or Rectal cancer.  ROS:   Please see the history of present illness.    All other systems reviewed and are negative.  EKGs/Labs/Other Studies Reviewed:    The following studies were reviewed today:  EKG:  NSR, nonspecific T wave abnl  Recent Labs: 06/04/2019: ALT 32 03/16/2020: BUN 16; Creatinine, Ser 0.86; Hemoglobin 14.5; Magnesium 1.7; Platelets 338; Potassium 3.7; Sodium 139; TSH 0.605  Recent Lipid Panel    Component Value Date/Time   CHOL 198 03/16/2020 1047   TRIG 236 (H) 03/16/2020 1047   HDL 39 (L) 03/16/2020 1047   CHOLHDL 5.1 (H) 03/16/2020 1047   CHOLHDL 7.3 (H) 07/12/2017 0950   VLDL 50 (H) 04/06/2017 0902   LDLCALC 118 (H) 03/16/2020 1047   LDLCALC 183 (H) 07/12/2017 0950    Physical Exam:    VS:  BP 125/60   Pulse 74   Temp (!) 96.8 F (36 C)   Ht $R'4\' 11"'ks$  (1.499 m)   Wt 185 lb 3.2 oz (84 kg)   LMP 08/22/2011 Comment: patient states LMP 20 years ago  SpO2 97%   BMI 37.41 kg/m     Wt Readings from Last 5 Encounters:  05/15/20 185 lb 3.2 oz (84 kg)  04/22/20 184 lb (83.5 kg)  03/16/20 181 lb (82.1 kg)  01/10/20 187 lb (84.8 kg)  09/20/19 180 lb 6.4 oz (81.8 kg)      Constitutional: No acute distress Eyes: sclera non-icteric, normal conjunctiva and lids ENMT: normal dentition, moist mucous membranes Cardiovascular: regular rhythm, normal rate, no murmurs. S1 and S2 normal. Radial pulses normal bilaterally. No jugular venous distention.  Respiratory: clear to auscultation bilaterally GI : normal bowel sounds, soft and nontender. No distention.   MSK: extremities warm, well perfused. No edema.  NEURO: grossly nonfocal exam, moves all extremities. PSYCH: alert and oriented x 3, normal mood and affect.  ASSESSMENT:    1. Essential hypertension   2. Hypercholesterolemia with hypertriglyceridemia   3. Statin intolerance   4. Obesity (BMI 30-39.9)   5. Atypical chest pain    PLAN:    Essential hypertension - Plan: EKG 12-Lead -BP stable on hydrochlorothiazide 25 mg daily, continue current dose.  Hypercholesterolemia with hypertriglyceridemia Statin intolerance -She has been seen by Dr. Debara Pickett previously.  She may be a good candidate for PCSK9 inhibitor given statin intolerance.  Will arrange follow-up visit with Dr. Debara Pickett to discuss.  Obesity (BMI 30-39.9)-discussed diet and exercise recommendations for best cardiovascular health.   Atypical chest pain -no recurrence of chest pain.  Total time of encounter: 30 minutes total time of encounter, including 20 minutes spent in face-to-face patient care on the date of this encounter. This time includes coordination of care and counseling regarding above mentioned problem list. Remainder of non-face-to-face time involved reviewing chart documents/testing relevant to the patient encounter and documentation in the medical record. I have independently reviewed documentation from referring provider.   Cherlynn Kaiser, MD [place]  [facility]    Medication Adjustments/Labs and Tests Ordered: Current medicines are reviewed at length with the patient today.  Concerns regarding medicines are outlined  above.   Orders Placed This Encounter  Procedures  . EKG 12-Lead    Meds ordered this encounter  Medications  . rosuvastatin (CRESTOR) 10 MG tablet    Sig: Take 1 tablet (10 mg total) by mouth 3 (three) times a week.    Dispense:  30 tablet    Refill:  2    PLEASE SCHEDULE AN APPT FOR FUTURE REFILLS    Patient Instructions  Medication Instructions:  No Changes In Medications at this time.  *If you need a refill on your cardiac medications before your next appointment, please call your pharmacy*  Lab Work: None Ordered At This Time.  If you have labs (blood work) drawn today and your tests are completely normal, you will receive your results only by: Marland Kitchen MyChart Message (if you have MyChart) OR . A paper copy in the mail If you have any lab test that is abnormal or we need to change your treatment, we will call you to review the results.  Testing/Procedures: None Ordered At This Time.   Follow-Up: At [facility], you and your health needs are our priority.  As part of our continuing mission to provide you with exceptional heart care, we have created designated Provider Care Teams.  These Care Teams include your primary Cardiologist (physician) and Advanced Practice Providers (APPs -  Physician Assistants and Nurse Practitioners) who all work together to provide you with the care you need, when you need it.  Your next appointment:   6 month(s)  The format for your next appointment:   In Person  Provider:   Cherlynn Kaiser, MD  Other Instructions NEEDS FOLLOW UP WITH [facility]- NEXT AVAILABLE

## 2020-05-22 ENCOUNTER — Other Ambulatory Visit: Payer: Self-pay

## 2020-05-22 ENCOUNTER — Ambulatory Visit (INDEPENDENT_AMBULATORY_CARE_PROVIDER_SITE_OTHER): Payer: Medicaid Other | Admitting: Nurse Practitioner

## 2020-05-22 DIAGNOSIS — Z23 Encounter for immunization: Secondary | ICD-10-CM | POA: Diagnosis not present

## 2020-06-05 ENCOUNTER — Ambulatory Visit (INDEPENDENT_AMBULATORY_CARE_PROVIDER_SITE_OTHER): Payer: Medicaid Other

## 2020-06-05 ENCOUNTER — Other Ambulatory Visit: Payer: Self-pay

## 2020-06-05 ENCOUNTER — Encounter (HOSPITAL_COMMUNITY): Payer: Self-pay

## 2020-06-05 ENCOUNTER — Ambulatory Visit (HOSPITAL_COMMUNITY)
Admission: EM | Admit: 2020-06-05 | Discharge: 2020-06-05 | Disposition: A | Discharge status: Home / Self Care | Payer: Medicaid Other | Attending: Urgent Care | Admitting: Urgent Care

## 2020-06-05 DIAGNOSIS — E119 Type 2 diabetes mellitus without complications: Secondary | ICD-10-CM

## 2020-06-05 DIAGNOSIS — M25562 Pain in left knee: Secondary | ICD-10-CM | POA: Diagnosis not present

## 2020-06-05 MED ORDER — PREDNISONE 10 MG PO TABS: 30.0000 mg | ORAL_TABLET | Freq: Every day | ORAL | 0 refills | Status: DC

## 2020-06-05 NOTE — ED Triage Notes (Signed)
Pt complaining of Left knee pain. Symptom started on Tuesday. Pt has been trying old remedies to ease pain but no relief. Pt is having trouble walking.

## 2020-06-05 NOTE — ED Provider Notes (Signed)
[place] [facility]   MRN: [id number] DOB: Jun 08, 1966  Subjective:   Jessica Lawson is a 54 y.o. female presenting for 3-day history of acute onset left knee pain.  Has had significant difficulty walking and bearing weight.  Denies any particular traumas, redness, falls, swelling.  Denies history of arthritis but that is her primary concern.  Has diabetes, last A1c was 7.3% in August 2021.  Patient is a Gaffer, works with patient assistance.  States that she has to do a lot of lifting, walking and long periods of standing.  No current facility-administered medications for this encounter.  Current Outpatient Medications:  .  ACCU-CHEK GUIDE test strip, USE AS DIRECTED FOUR TIMES DAILY., Disp: 100 strip, Rfl: 3 .  Accu-Chek Softclix Lancets lancets, USE AS DIRECTED FOUR TIMES DAILY., Disp: 100 each, Rfl: 3 .  blood glucose meter kit and supplies KIT, Dispense based on patient and insurance preference. Use up to four times daily as directed. (FOR ICD-9 250.00, 250.01)., Disp: 1 each, Rfl: 0 .  blood glucose meter kit and supplies, Dispense based on patient and insurance preference. Use up to four times daily as directed. (FOR ICD-10 E10.9, E11.9)., Disp: 1 each, Rfl: 0 .  cetirizine (ZYRTEC) 10 MG tablet, TAKE 1 TABLET(10 MG) BY MOUTH DAILY, Disp: 30 tablet, Rfl: 11 .  fenofibrate (TRICOR) 48 MG tablet, Take 1 tablet (48 mg total) by mouth daily., Disp: 30 tablet, Rfl: 5 .  fluticasone (FLONASE) 50 MCG/ACT nasal spray, Place 2 sprays into both nostrils daily., Disp: 16 g, Rfl: 6 .  hydrochlorothiazide (HYDRODIURIL) 25 MG tablet, Take 1 tablet (25 mg total) by mouth daily., Disp: 90 tablet, Rfl: 1 .  loratadine (CLARITIN) 10 MG tablet, Take 1 tablet (10 mg total) by mouth daily., Disp: 30 tablet, Rfl: 11 .  metFORMIN (GLUCOPHAGE) 500 MG tablet, TAKE 1 TABLET(500 MG) BY MOUTH TWICE DAILY WITH A MEAL, Disp: 180 tablet, Rfl: 1 .  methocarbamol (ROBAXIN) 500 MG tablet, Take 1  tablet (500 mg total) by mouth 2 (two) times daily as needed for muscle spasms., Disp: 30 tablet, Rfl: 2 .  Multiple Vitamins-Minerals (MULTIVITAMIN WITH MINERALS) tablet, Take 1 tablet by mouth daily., Disp: , Rfl:  .  omega-3 acid ethyl esters (LOVAZA) 1 g capsule, TAKE 1 CAPSULE BY MOUTH EVERY DAY, Disp: 90 capsule, Rfl: 1 .  ondansetron (ZOFRAN) 4 MG tablet, Take 1 tablet (4 mg total) by mouth every 8 (eight) hours as needed for nausea or vomiting., Disp: 30 tablet, Rfl: 1 .  PROAIR HFA 108 (90 Base) MCG/ACT inhaler, INHALE 2 PUFFS INTO THE LUNGS EVERY 4 HOURS AS NEEDED FOR WHEEZING OR SHORTNESS OF BREATH, Disp: 8.5 g, Rfl: 3 .  rosuvastatin (CRESTOR) 10 MG tablet, Take 1 tablet (10 mg total) by mouth 3 (three) times a week., Disp: 30 tablet, Rfl: 2 .  valACYclovir (VALTREX) 1000 MG tablet, Take 1 tablet (1,000 mg total) by mouth 2 (two) times daily., Disp: 20 tablet, Rfl: 0   Allergies  Allergen Reactions  . Asa [Aspirin] Anaphylaxis, Hives and Swelling  . Iohexol Other (See Comments)     Desc: pt complains of difficulty swallowing and thickened tongue/ throat closes up   . Penicillins Hives, Swelling and Other (See Comments)    Thrush   . Sulfa Antibiotics Anaphylaxis, Hives, Swelling and Other (See Comments)  . Nystatin Other (See Comments)    thrush    Past Medical History:  Diagnosis Date  . Allergy   .  Back pain   . Diabetes mellitus without complication ([place])   . High blood cholesterol   . Hypertension   . Kidney stone   . Normal cardiac stress test 02/2016   low risk  . Obesity   . Pain of cheek    and right side neck  . Tobacco use   . UTI (lower urinary tract infection)   . Wrist pain    left wrist     Past Surgical History:  Procedure Laterality Date  . CESAREAN SECTION     3 c-sections  . DENTAL SURGERY     teeth removed with meds to calm     Family History  Problem Relation Age of Onset  . Hyperlipidemia Other   . Hypertension Other   . Diabetes  Other   . Diabetes Mother   . Lung disease Father   . Diabetes Sister   . Diabetes Sister   . Hypertension Sister   . Colon cancer Neg Hx   . Colon polyps Neg Hx   . Esophageal cancer Neg Hx   . Stomach cancer Neg Hx   . Rectal cancer Neg Hx     Social History   Tobacco Use  . Smoking status: Former Smoker    Packs/day: 1.00    Types: Cigarettes    Quit date: 07/26/2016    Years since quitting: 3.8  . Smokeless tobacco: Never Used  Vaping Use  . Vaping Use: Never used  Substance Use Topics  . Alcohol use: No  . Drug use: No    ROS   Objective:   Vitals: BP (!) 151/97 (BP Location: Right Arm)   Pulse (!) 101   Temp 98.1 F (36.7 C) (Oral)   Resp 16   LMP 08/22/2011 Comment: patient states LMP 20 years ago  SpO2 100%   Physical Exam Constitutional:      General: She is not in acute distress.    Appearance: Normal appearance. She is well-developed. She is obese. She is not ill-appearing, toxic-appearing or diaphoretic.  HENT:     Head: Normocephalic and atraumatic.     Nose: Nose normal.     Mouth/Throat:     Mouth: Mucous membranes are moist.     Pharynx: Oropharynx is clear.  Eyes:     General: No scleral icterus.    Extraocular Movements: Extraocular movements intact.     Pupils: Pupils are equal, round, and reactive to light.  Cardiovascular:     Rate and Rhythm: Normal rate.  Pulmonary:     Effort: Pulmonary effort is normal.  Musculoskeletal:     Left knee: No swelling, deformity, effusion, erythema, ecchymosis, lacerations, bony tenderness or crepitus. Decreased range of motion. Tenderness present over the patellar tendon. No medial joint line or lateral joint line tenderness. Normal alignment and normal patellar mobility.  Skin:    General: Skin is warm and dry.  Neurological:     General: No focal deficit present.     Mental Status: She is alert and oriented to person, place, and time.  Psychiatric:        Mood and Affect: Mood normal.         Behavior: Behavior normal.    DG Knee Complete 4 Views Left  Result Date: 06/05/2020 CLINICAL DATA:  Knee pain beginning 3 days ago. EXAM: LEFT KNEE - COMPLETE 4+ VIEW COMPARISON:  None. FINDINGS: No joint space narrowing. No osteophyte formation. No joint effusion. No focal bone finding. IMPRESSION: Normal radiographs. Electronically  Signed   By: Nelson Chimes M.D.   On: 06/05/2020 16:17   Assessment and Plan :   PDMP not reviewed this encounter.  1. Acute pain of left knee   2. Well controlled diabetes mellitus ([place])     Suspect inflammatory process related to the nature of her work and excessive use of her knee, obesity.  Patient is not a candidate for NSAIDs given her history of allergies and her PCP has advised that she avoid these altogether.  Recommended oral prednisone course, follow-up with Ortho.  Provided her with a note for work for the next few days. Counseled patient on potential for adverse effects with medications prescribed/recommended today, ER and return-to-clinic precautions discussed, patient verbalized understanding.    Jaynee Eagles, [place] 06/05/20 1636

## 2020-06-08 DIAGNOSIS — S83242A Other tear of medial meniscus, current injury, left knee, initial encounter: Secondary | ICD-10-CM | POA: Diagnosis not present

## 2020-06-16 ENCOUNTER — Other Ambulatory Visit (INDEPENDENT_AMBULATORY_CARE_PROVIDER_SITE_OTHER): Payer: Medicaid Other

## 2020-06-16 ENCOUNTER — Other Ambulatory Visit: Payer: Self-pay

## 2020-06-16 DIAGNOSIS — Z0000 Encounter for general adult medical examination without abnormal findings: Secondary | ICD-10-CM | POA: Diagnosis not present

## 2020-06-16 DIAGNOSIS — E119 Type 2 diabetes mellitus without complications: Secondary | ICD-10-CM | POA: Diagnosis not present

## 2020-06-16 NOTE — Progress Notes (Signed)
Patient to return at a later date this week for MMR vaccination.

## 2020-06-17 ENCOUNTER — Encounter: Payer: Self-pay | Admitting: Nurse Practitioner

## 2020-06-17 LAB — CBC WITH DIFFERENTIAL/PLATELET
Basophils Absolute: 0.1 10*3/uL (ref 0.0–0.2)
Basos: 1 %
EOS (ABSOLUTE): 0.1 10*3/uL (ref 0.0–0.4)
Eos: 1 %
Hematocrit: 42.5 % (ref 34.0–46.6)
Hemoglobin: 14.3 g/dL (ref 11.1–15.9)
Immature Grans (Abs): 0 10*3/uL (ref 0.0–0.1)
Immature Granulocytes: 0 %
Lymphocytes Absolute: 4.1 10*3/uL — ABNORMAL HIGH (ref 0.7–3.1)
Lymphs: 55 %
MCH: 28.2 pg (ref 26.6–33.0)
MCHC: 33.6 g/dL (ref 31.5–35.7)
MCV: 84 fL (ref 79–97)
Monocytes Absolute: 0.6 10*3/uL (ref 0.1–0.9)
Monocytes: 8 %
Neutrophils Absolute: 2.7 10*3/uL (ref 1.4–7.0)
Neutrophils: 35 %
Platelets: 420 10*3/uL (ref 150–450)
RBC: 5.07 x10E6/uL (ref 3.77–5.28)
RDW: 13.6 % (ref 11.7–15.4)
WBC: 7.6 10*3/uL (ref 3.4–10.8)

## 2020-06-17 LAB — LIPID PANEL
Chol/HDL Ratio: 5.9 ratio — ABNORMAL HIGH (ref 0.0–4.4)
Cholesterol, Total: 219 mg/dL — ABNORMAL HIGH (ref 100–199)
HDL: 37 mg/dL — ABNORMAL LOW (ref >39)
LDL Chol Calc (NIH): 130 mg/dL — ABNORMAL HIGH (ref 0–99)
Triglycerides: 288 mg/dL — ABNORMAL HIGH (ref 0–149)
VLDL Cholesterol Cal: 52 mg/dL — ABNORMAL HIGH (ref 5–40)

## 2020-06-17 LAB — HEMOGLOBIN A1C
Est. average glucose Bld gHb Est-mCnc: 183 mg/dL
Hgb A1c MFr Bld: 8 % — ABNORMAL HIGH (ref 4.8–5.6)

## 2020-06-18 ENCOUNTER — Ambulatory Visit: Payer: Medicaid Other

## 2020-06-18 ENCOUNTER — Other Ambulatory Visit: Payer: Self-pay

## 2020-06-18 DIAGNOSIS — Z0000 Encounter for general adult medical examination without abnormal findings: Secondary | ICD-10-CM

## 2020-07-02 ENCOUNTER — Other Ambulatory Visit: Payer: Self-pay

## 2020-07-02 ENCOUNTER — Ambulatory Visit (INDEPENDENT_AMBULATORY_CARE_PROVIDER_SITE_OTHER): Payer: Medicaid Other | Admitting: Internal Medicine

## 2020-07-02 ENCOUNTER — Encounter: Payer: Self-pay | Admitting: Internal Medicine

## 2020-07-02 VITALS — BP 129/73 | HR 79 | Ht 59.0 in | Wt 186.0 lb

## 2020-07-02 DIAGNOSIS — E782 Mixed hyperlipidemia: Secondary | ICD-10-CM | POA: Diagnosis not present

## 2020-07-02 DIAGNOSIS — T466X5A Adverse effect of antihyperlipidemic and antiarteriosclerotic drugs, initial encounter: Secondary | ICD-10-CM | POA: Diagnosis not present

## 2020-07-02 DIAGNOSIS — E669 Obesity, unspecified: Secondary | ICD-10-CM | POA: Diagnosis not present

## 2020-07-02 DIAGNOSIS — M791 Myalgia, unspecified site: Secondary | ICD-10-CM

## 2020-07-02 DIAGNOSIS — I10 Essential (primary) hypertension: Secondary | ICD-10-CM | POA: Diagnosis not present

## 2020-07-02 MED ORDER — EZETIMIBE 10 MG PO TABS: 10.0000 mg | ORAL_TABLET | Freq: Every day | ORAL | 3 refills | Status: DC

## 2020-07-02 NOTE — Progress Notes (Signed)
LIPID CLINIC CONSULT NOTE  Chief Complaint:  Manage dyslipidemia  Primary Care Physician: Vevelyn Francois, NP  Primary Cardiologist:  Elouise Munroe, MD  HPI:  Jessica Lawson is a 53 y.o. female who is being seen today for the evaluation of dyslipidemia at the request of Vevelyn Francois, NP.  This is a pleasant 54 year old female followed by Dr. Ainsley Spinner and recently seen by Dr. Harrell Gave.  She has a history of atypical chest pain and dyslipidemia with statin intolerance.  She seemed to have cramping in her legs which may have been related to higher dose fenofibrate but after reducing the dose her symptoms improved.  She is tolerating moderate dose rosuvastatin 10 mg daily.  Repeat lipids have shown marked improvement in her numbers.  She is also made significant dietary changes.  Previously her total cholesterol is 335, now down to 190.  Triglycerides have come down from 258-227.  HDL is 36 and LDL is 109.  She does have borderline diabetes on Metformin but no significant family history of early onset heart disease.  She has a history of hypertension which is well controlled today at 118/77.  07/02/2020  Jessica Lawson returns today for follow-up.  Unfortunately she was not tolerant of increasing the rosuvastatin from 10 to 20 mg.  She recently saw Dr. Margaretann Loveless who decreased her rosuvastatin back to 10 mg daily.  She is also on low-dose fenofibrate.  Her most recent lipid showed total cholesterol of 219, triglycerides 288, HDL 37 and LDL 130 (up from 118 when she was on a higher dose).  Difficult to quantify her cardiovascular risk although risk factors include obesity and borderline diabetes as well as prehypertension.  PMHx:  Past Medical History:  Diagnosis Date  . Allergy   . Back pain   . Diabetes mellitus without complication ([address])   . High blood cholesterol   . Hypertension   . Kidney stone   . Normal cardiac stress test 02/2016   low risk  . Obesity   . Pain of cheek     and right side neck  . Tobacco use   . UTI (lower urinary tract infection)   . Wrist pain    left wrist    Past Surgical History:  Procedure Laterality Date  . CESAREAN SECTION     3 c-sections  . DENTAL SURGERY     teeth removed with meds to calm     FAMHx:  Family History  Problem Relation Age of Onset  . Hyperlipidemia Other   . Hypertension Other   . Diabetes Other   . Diabetes Mother   . Lung disease Father   . Diabetes Sister   . Diabetes Sister   . Hypertension Sister   . Colon cancer Neg Hx   . Colon polyps Neg Hx   . Esophageal cancer Neg Hx   . Stomach cancer Neg Hx   . Rectal cancer Neg Hx     SOCHx:   reports that she quit smoking about 3 years ago. Her smoking use included cigarettes. She smoked 1.00 pack per day. She has never used smokeless tobacco. She reports that she does not drink alcohol and does not use drugs.  ALLERGIES:  Allergies  Allergen Reactions  . Asa [Aspirin] Anaphylaxis, Hives and Swelling  . Iohexol Other (See Comments)     Desc: pt complains of difficulty swallowing and thickened tongue/ throat closes up   . Penicillins Hives, Swelling and Other (See Comments)  Thrush   . Sulfa Antibiotics Anaphylaxis, Hives, Swelling and Other (See Comments)  . Nystatin Other (See Comments)    thrush    ROS: Pertinent items noted in HPI and remainder of comprehensive ROS otherwise negative.  HOME MEDS: Current Outpatient Medications on File Prior to Visit  Medication Sig Dispense Refill  . ACCU-CHEK GUIDE test strip USE AS DIRECTED FOUR TIMES DAILY. 100 strip 3  . Accu-Chek Softclix Lancets lancets USE AS DIRECTED FOUR TIMES DAILY. 100 each 3  . blood glucose meter kit and supplies KIT Dispense based on patient and insurance preference. Use up to four times daily as directed. (FOR ICD-9 250.00, 250.01). 1 each 0  . blood glucose meter kit and supplies Dispense based on patient and insurance preference. Use up to four times daily as  directed. (FOR ICD-10 E10.9, E11.9). 1 each 0  . cetirizine (ZYRTEC) 10 MG tablet TAKE 1 TABLET(10 MG) BY MOUTH DAILY 30 tablet 11  . fluticasone (FLONASE) 50 MCG/ACT nasal spray Place 2 sprays into both nostrils daily. 16 g 6  . hydrochlorothiazide (HYDRODIURIL) 25 MG tablet Take 1 tablet (25 mg total) by mouth daily. 90 tablet 1  . loratadine (CLARITIN) 10 MG tablet Take 1 tablet (10 mg total) by mouth daily. 30 tablet 11  . metFORMIN (GLUCOPHAGE) 500 MG tablet TAKE 1 TABLET(500 MG) BY MOUTH TWICE DAILY WITH A MEAL 180 tablet 1  . methocarbamol (ROBAXIN) 500 MG tablet Take 1 tablet (500 mg total) by mouth 2 (two) times daily as needed for muscle spasms. 30 tablet 2  . Multiple Vitamins-Minerals (MULTIVITAMIN WITH MINERALS) tablet Take 1 tablet by mouth daily.    Marland Kitchen omega-3 acid ethyl esters (LOVAZA) 1 g capsule TAKE 1 CAPSULE BY MOUTH EVERY DAY 90 capsule 1  . ondansetron (ZOFRAN) 4 MG tablet Take 1 tablet (4 mg total) by mouth every 8 (eight) hours as needed for nausea or vomiting. 30 tablet 1  . predniSONE (DELTASONE) 10 MG tablet Take 3 tablets (30 mg total) by mouth daily with breakfast. 15 tablet 0  . PROAIR HFA 108 (90 Base) MCG/ACT inhaler INHALE 2 PUFFS INTO THE LUNGS EVERY 4 HOURS AS NEEDED FOR WHEEZING OR SHORTNESS OF BREATH 8.5 g 3  . rosuvastatin (CRESTOR) 10 MG tablet Take 1 tablet (10 mg total) by mouth 3 (three) times a week. 30 tablet 2  . valACYclovir (VALTREX) 1000 MG tablet Take 1 tablet (1,000 mg total) by mouth 2 (two) times daily. 20 tablet 0  . fenofibrate (TRICOR) 48 MG tablet Take 1 tablet (48 mg total) by mouth daily. 30 tablet 5   No current facility-administered medications on file prior to visit.    LABS/IMAGING: No results found for this or any previous visit (from the past 48 hour(s)). No results found.  LIPID PANEL:    Component Value Date/Time   CHOL 219 (H) 06/16/2020 0908   TRIG 288 (H) 06/16/2020 0908   HDL 37 (L) 06/16/2020 0908   CHOLHDL 5.9 (H)  06/16/2020 0908   CHOLHDL 7.3 (H) 07/12/2017 0950   VLDL 50 (H) 04/06/2017 0902   LDLCALC 130 (H) 06/16/2020 0908   LDLCALC 183 (H) 07/12/2017 0950    WEIGHTS: Wt Readings from Last 3 Encounters:  07/02/20 186 lb (84.4 kg)  05/15/20 185 lb 3.2 oz (84 kg)  04/22/20 184 lb (83.5 kg)    VITALS: BP 129/73   Pulse 79   Ht $R'4\' 11"'sJ$  (1.499 m)   Wt 186 lb (84.4 kg)  LMP 08/22/2011 Comment: patient states LMP 20 years ago  SpO2 97%   BMI 37.57 kg/m   EXAM: Deferred  EKG: Deferred  ASSESSMENT: 1. Mixed dyslipidemia, goal LDL <100 2. Intermediate 10 year risk by pooled cohort 3. Moderate obesity 4. Hypertension - at goal 5. DM2- on metformin, A1c 6.2  PLAN: 1.   Jessica Lawson has multiple cardiovascular risk factors and falls in an intermediate risk.  I had again discussed the possibility of her stratify her further with coronary calcium scoring however its not clear that she is interested at this time.  She cannot tolerate higher dose rosuvastatin therefore I think we will have to reach to another agent.  Would recommend ezetimibe 10 mg daily in addition to her fenofibrate and statin.  She will need to be mindful of possible statin related side effects.  I also cautioned her about possible GI side effects related to the ezetimibe.  Plan repeat lipids in about 3 months and follow-up with me in 6 months.  Pixie Casino, MD, [facility], [place] Director of the Advanced Lipid Disorders &  Cardiovascular Risk Reduction Clinic Diplomate of the American Board of Clinical Lipidology Attending Cardiologist  Direct Dial: [phone]  Fax: [phone]  Website:  [email or web address] [email or web address] Pryce Folts 07/02/2020, 8:30 AM

## 2020-07-02 NOTE — Patient Instructions (Signed)
Medication Instructions:  Your physician has recommended you make the following change in your medication:  -- START zetia 10mg  daily  *If you need a refill on your cardiac medications before your next appointment, please call your pharmacy*   Lab Work: FASTING LIPID PANEL in 3 months  If you have labs (blood work) drawn today and your tests are completely normal, you will receive your results only by: Marland Kitchen MyChart Message (if you have MyChart) OR . A paper copy in the mail If you have any lab test that is abnormal or we need to change your treatment, we will call you to review the results.   Testing/Procedures: NONE   Follow-Up: At [facility], you and your health needs are our priority.  As part of our continuing mission to provide you with exceptional heart care, we have created designated Provider Care Teams.  These Care Teams include your primary Cardiologist (physician) and Advanced Practice Providers (APPs -  Physician Assistants and Nurse Practitioners) who all work together to provide you with the care you need, when you need it.  We recommend signing up for the patient portal called "MyChart".  Sign up information is provided on this After Visit Summary.  MyChart is used to connect with patients for Virtual Visits (Telemedicine).  Patients are able to view lab/test results, encounter notes, upcoming appointments, etc.  Non-urgent messages can be sent to your provider as well.   To learn more about what you can do with MyChart, go to [email or web address].    Your next appointment:   6 month(s) - lipid clinic  The format for your next appointment:   In Person  Provider:   K. Mali Hilty, MD   Other Instructions

## 2020-07-14 ENCOUNTER — Other Ambulatory Visit: Payer: Self-pay | Admitting: Family Medicine

## 2020-07-14 DIAGNOSIS — I10 Essential (primary) hypertension: Secondary | ICD-10-CM

## 2020-07-17 ENCOUNTER — Other Ambulatory Visit: Payer: Self-pay

## 2020-07-17 DIAGNOSIS — I10 Essential (primary) hypertension: Secondary | ICD-10-CM

## 2020-07-17 MED ORDER — HYDROCHLOROTHIAZIDE 25 MG PO TABS: 25.0000 mg | ORAL_TABLET | Freq: Every day | ORAL | 1 refills | Status: DC

## 2020-08-13 ENCOUNTER — Other Ambulatory Visit: Payer: Self-pay | Admitting: Family Medicine

## 2020-08-13 DIAGNOSIS — J0100 Acute maxillary sinusitis, unspecified: Secondary | ICD-10-CM

## 2020-09-16 DIAGNOSIS — H5213 Myopia, bilateral: Secondary | ICD-10-CM | POA: Diagnosis not present

## 2020-09-18 ENCOUNTER — Other Ambulatory Visit: Payer: Self-pay

## 2020-09-18 DIAGNOSIS — E785 Hyperlipidemia, unspecified: Secondary | ICD-10-CM

## 2020-09-21 ENCOUNTER — Other Ambulatory Visit: Payer: Self-pay

## 2020-09-21 DIAGNOSIS — E785 Hyperlipidemia, unspecified: Secondary | ICD-10-CM

## 2020-09-21 MED ORDER — FENOFIBRATE 48 MG PO TABS: 48.0000 mg | ORAL_TABLET | Freq: Every day | ORAL | 0 refills | Status: DC

## 2020-09-22 ENCOUNTER — Other Ambulatory Visit: Payer: Self-pay | Admitting: Nurse Practitioner

## 2020-09-22 DIAGNOSIS — E785 Hyperlipidemia, unspecified: Secondary | ICD-10-CM

## 2020-09-30 DIAGNOSIS — E782 Mixed hyperlipidemia: Secondary | ICD-10-CM | POA: Diagnosis not present

## 2020-09-30 LAB — LIPID PANEL
Chol/HDL Ratio: 4 ratio (ref 0.0–4.4)
Cholesterol, Total: 143 mg/dL (ref 100–199)
HDL: 36 mg/dL — ABNORMAL LOW (ref >39)
LDL Chol Calc (NIH): 70 mg/dL (ref 0–99)
Triglycerides: 228 mg/dL — ABNORMAL HIGH (ref 0–149)
VLDL Cholesterol Cal: 37 mg/dL (ref 5–40)

## 2020-10-07 ENCOUNTER — Other Ambulatory Visit: Payer: Self-pay | Admitting: Nurse Practitioner

## 2020-10-07 DIAGNOSIS — B009 Herpesviral infection, unspecified: Secondary | ICD-10-CM

## 2020-10-07 MED ORDER — VALACYCLOVIR HCL 1 G PO TABS: 1000.0000 mg | ORAL_TABLET | Freq: Two times a day (BID) | ORAL | 0 refills | Status: DC

## 2020-10-07 NOTE — Progress Notes (Signed)
   [address] [place], [place]  [address] Phone:  [phone]   Fax:  [phone]   Patient has fever blister. Does want refill of Valtrex.

## 2020-10-14 ENCOUNTER — Other Ambulatory Visit: Payer: Self-pay | Admitting: Nurse Practitioner

## 2020-10-14 DIAGNOSIS — Z1231 Encounter for screening mammogram for malignant neoplasm of breast: Secondary | ICD-10-CM

## 2020-10-16 ENCOUNTER — Other Ambulatory Visit: Payer: Self-pay

## 2020-10-16 ENCOUNTER — Encounter: Payer: Self-pay | Admitting: Nurse Practitioner

## 2020-10-16 ENCOUNTER — Ambulatory Visit (INDEPENDENT_AMBULATORY_CARE_PROVIDER_SITE_OTHER): Payer: 59 | Admitting: Nurse Practitioner

## 2020-10-16 VITALS — BP 149/76 | HR 69 | Temp 97.7°F | Ht 59.0 in | Wt 183.6 lb

## 2020-10-16 DIAGNOSIS — I10 Essential (primary) hypertension: Secondary | ICD-10-CM

## 2020-10-16 DIAGNOSIS — B379 Candidiasis, unspecified: Secondary | ICD-10-CM | POA: Diagnosis not present

## 2020-10-16 DIAGNOSIS — E119 Type 2 diabetes mellitus without complications: Secondary | ICD-10-CM | POA: Diagnosis not present

## 2020-10-16 LAB — POCT GLYCOSYLATED HEMOGLOBIN (HGB A1C): HbA1c, POC (controlled diabetic range): 7.6 % — AB (ref 0.0–7.0)

## 2020-10-16 LAB — GLUCOSE, POCT (MANUAL RESULT ENTRY)

## 2020-10-16 MED ORDER — FLUCONAZOLE 150 MG PO TABS: 150.0000 mg | ORAL_TABLET | Freq: Once | ORAL | 0 refills | Status: AC

## 2020-10-16 NOTE — Progress Notes (Signed)
[place], [place]  [address] Phone:  [phone]   Fax:  [phone]   Established Patient Office Visit  Subjective:  Patient ID: Jessica Lawson, female    DOB: Jul 01, 1966  Age: 55 y.o. MRN: [id number]  CC: No chief complaint on file.   HPI Keyli Duross presents for follow up. She  has a past medical history of Allergy, Back pain, Diabetes mellitus without complication ([place]), High blood cholesterol, Hypertension, Kidney stone, Normal cardiac stress test (02/2016), Obesity, Pain of cheek, Tobacco use, UTI (lower urinary tract infection), and Wrist pain.   Diabetes Mellitus Patient presents for follow up of diabetes. Current symptoms include: hyperglycemia. Symptoms have stabilized. Patient denies foot ulcerations, hypoglycemia , increased appetite, nausea, polydipsia, polyuria and vomiting. Evaluation to date has included: fasting blood sugar, fasting lipid panel, hemoglobin A1C and microalbuminuria.  Home sugars: BGs are running  consistent with Hgb A1C. Current treatment: Continued metformin which has been somewhat effective, Continued statin which has been somewhat effective and Continued fenofibrate which has been somewhat effective. Last dilated eye exam: 2021.  Past Medical History:  Diagnosis Date  . Allergy   . Back pain   . Diabetes mellitus without complication ([place])   . High blood cholesterol   . Hypertension   . Kidney stone   . Normal cardiac stress test 02/2016   low risk  . Obesity   . Pain of cheek    and right side neck  . Tobacco use   . UTI (lower urinary tract infection)   . Wrist pain    left wrist    Past Surgical History:  Procedure Laterality Date  . CESAREAN SECTION     3 c-sections  . DENTAL SURGERY     teeth removed with meds to calm     Family History  Problem Relation Age of Onset  . Hyperlipidemia Other   . Hypertension Other   . Diabetes Other   . Diabetes Mother   . Lung disease Father    . Diabetes Sister   . Diabetes Sister   . Hypertension Sister   . Colon cancer Neg Hx   . Colon polyps Neg Hx   . Esophageal cancer Neg Hx   . Stomach cancer Neg Hx   . Rectal cancer Neg Hx     Social History   Socioeconomic History  . Marital status: Single    Spouse name: Not on file  . Number of children: Not on file  . Years of education: Not on file  . Highest education level: Not on file  Occupational History  . Not on file  Tobacco Use  . Smoking status: Former Smoker    Packs/day: 1.00    Types: Cigarettes    Quit date: 07/26/2016    Years since quitting: 4.2  . Smokeless tobacco: Never Used  Vaping Use  . Vaping Use: Never used  Substance and Sexual Activity  . Alcohol use: No  . Drug use: No  . Sexual activity: Not Currently  Other Topics Concern  . Not on file  Social History Narrative  . Not on file   Social Determinants of Health   Financial Resource Strain: Not on file  Food Insecurity: Not on file  Transportation Needs: Not on file  Physical Activity: Not on file  Stress: Not on file  Social Connections: Not on file  Intimate Partner Violence: Not on file    Outpatient Medications Prior to Visit  Medication Sig Dispense Refill  . ACCU-CHEK GUIDE test strip USE AS DIRECTED FOUR TIMES DAILY. 100 strip 3  . Accu-Chek Softclix Lancets lancets USE AS DIRECTED FOUR TIMES DAILY. 100 each 3  . blood glucose meter kit and supplies KIT Dispense based on patient and insurance preference. Use up to four times daily as directed. (FOR ICD-9 250.00, 250.01). 1 each 0  . blood glucose meter kit and supplies Dispense based on patient and insurance preference. Use up to four times daily as directed. (FOR ICD-10 E10.9, E11.9). 1 each 0  . cetirizine (ZYRTEC) 10 MG tablet TAKE 1 TABLET(10 MG) BY MOUTH DAILY 30 tablet 3  . ezetimibe (ZETIA) 10 MG tablet Take 1 tablet (10 mg total) by mouth daily. 90 tablet 3  . fenofibrate (TRICOR) 48 MG tablet Take 1 tablet (48  mg total) by mouth daily. 30 tablet 0  . fluticasone (FLONASE) 50 MCG/ACT nasal spray Place 2 sprays into both nostrils daily. 16 g 6  . hydrochlorothiazide (HYDRODIURIL) 25 MG tablet Take 1 tablet (25 mg total) by mouth daily. 90 tablet 1  . loratadine (CLARITIN) 10 MG tablet Take 1 tablet (10 mg total) by mouth daily. 30 tablet 11  . metFORMIN (GLUCOPHAGE) 500 MG tablet TAKE 1 TABLET(500 MG) BY MOUTH TWICE DAILY WITH A MEAL 180 tablet 1  . methocarbamol (ROBAXIN) 500 MG tablet Take 1 tablet (500 mg total) by mouth 2 (two) times daily as needed for muscle spasms. 30 tablet 2  . Multiple Vitamins-Minerals (MULTIVITAMIN WITH MINERALS) tablet Take 1 tablet by mouth daily.    Marland Kitchen omega-3 acid ethyl esters (LOVAZA) 1 g capsule TAKE 1 CAPSULE BY MOUTH EVERY DAY 90 capsule 1  . ondansetron (ZOFRAN) 4 MG tablet Take 1 tablet (4 mg total) by mouth every 8 (eight) hours as needed for nausea or vomiting. 30 tablet 1  . predniSONE (DELTASONE) 10 MG tablet Take 3 tablets (30 mg total) by mouth daily with breakfast. 15 tablet 0  . PROAIR HFA 108 (90 Base) MCG/ACT inhaler INHALE 2 PUFFS INTO THE LUNGS EVERY 4 HOURS AS NEEDED FOR WHEEZING OR SHORTNESS OF BREATH 8.5 g 3  . rosuvastatin (CRESTOR) 10 MG tablet Take 1 tablet (10 mg total) by mouth 3 (three) times a week. 30 tablet 2  . valACYclovir (VALTREX) 1000 MG tablet Take 1 tablet (1,000 mg total) by mouth 2 (two) times daily. 20 tablet 0   No facility-administered medications prior to visit.    Allergies  Allergen Reactions  . Asa [Aspirin] Anaphylaxis, Hives and Swelling  . Iohexol Other (See Comments)     Desc: pt complains of difficulty swallowing and thickened tongue/ throat closes up   . Penicillins Hives, Swelling and Other (See Comments)    Thrush   . Sulfa Antibiotics Anaphylaxis, Hives, Swelling and Other (See Comments)  . Nystatin Other (See Comments)    thrush    ROS Review of Systems  Constitutional: Negative.   HENT: Negative.    Eyes: Negative.   Respiratory: Negative.  Negative for shortness of breath.   Cardiovascular: Negative.  Negative for chest pain.  Endocrine: Negative.   Genitourinary: Positive for vaginal discharge.       Vaginal irritation   Allergic/Immunologic: Negative.   Neurological: Negative.   Hematological: Negative.   Psychiatric/Behavioral: Negative.       Objective:    Physical Exam Constitutional:      Appearance: She is obese.  HENT:     Head: Normocephalic and atraumatic.  Cardiovascular:     Rate and Rhythm: Normal rate and regular rhythm.     Pulses: Normal pulses.     Heart sounds: Normal heart sounds.  Pulmonary:     Comments: Diminished BS throughout  Abdominal:     Palpations: Abdomen is soft.  Musculoskeletal:        General: Normal range of motion.     Cervical back: Normal range of motion.  Skin:    General: Skin is warm and dry.     Capillary Refill: Capillary refill takes less than 2 seconds.  Neurological:     General: No focal deficit present.     Mental Status: She is alert and oriented to person, place, and time.  Psychiatric:        Mood and Affect: Mood normal.        Behavior: Behavior normal.        Thought Content: Thought content normal.        Judgment: Judgment normal.     BP (!) 149/76 (BP Location: Right Arm, Patient Position: Sitting, Cuff Size: Normal)   Pulse 69   Temp 97.7 F (36.5 C)   Ht _0  (1.499 m)   Wt 183 lb 9.6 oz (83.3 kg)   LMP 08/22/2011 Comment: patient states LMP 20 years ago  BMI 37.08 kg/m  Wt Readings from Last 3 Encounters:  10/16/20 183 lb 9.6 oz (83.3 kg)  07/02/20 186 lb (84.4 kg)  05/15/20 185 lb 3.2 oz (84 kg)     Health Maintenance Due  Topic Date Due  . PAP SMEAR-Modifier  10/30/2015  . OPHTHALMOLOGY EXAM  03/29/2020  . COVID-19 Vaccine (3 - Booster for Pfizer series) 08/30/2020    There are no preventive care reminders to display for this patient.  Lab Results  Component Value Date   TSH  0.605 03/16/2020   Lab Results  Component Value Date   WBC 7.6 06/16/2020   HGB 14.3 06/16/2020   HCT 42.5 06/16/2020   MCV 84 06/16/2020   PLT 420 06/16/2020   Lab Results  Component Value Date   NA 139 03/16/2020   K 3.7 03/16/2020   CO2 25 06/04/2019   GLUCOSE 182 (H) 03/16/2020   BUN 16 03/16/2020   CREATININE 0.86 03/16/2020   BILITOT 0.3 03/16/2020   ALKPHOS 53 03/16/2020   AST 25 03/16/2020   ALT 32 06/04/2019   PROT 7.8 03/16/2020   ALBUMIN 4.6 03/16/2020   CALCIUM 10.0 03/16/2020   ANIONGAP 11 05/18/2018   Lab Results  Component Value Date   CHOL 143 09/30/2020   Lab Results  Component Value Date   HDL 36 (L) 09/30/2020   Lab Results  Component Value Date   LDLCALC 70 09/30/2020   Lab Results  Component Value Date   TRIG 228 (H) 09/30/2020   Lab Results  Component Value Date   CHOLHDL 4.0 09/30/2020   Lab Results  Component Value Date   HGBA1C 7.6 (A) 10/16/2020      Assessment & Plan:   Problem List Items Addressed This Visit      Cardiovascular and Mediastinum   Essential hypertension - Primary Persistent follows up with cardiology, recent changes were made   Relevant Orders   Comp. Metabolic Panel (12)    Other Visit Diagnoses    Type 2 diabetes mellitus without complication, without long-term current use of insulin (HCC)     Improving hemoglobin A1c down to 7.6 from 8.04 months ago.  We will  continue with current regimen and follow-up in 3 months   Relevant Orders   POCT glycosylated hemoglobin (Hb A1C) (Completed)   Glucose (CBG) (Completed)   Microalbumin, urine   POCT URINALYSIS DIP (CLINITEK)   Comp. Metabolic Panel (12)   Candida infection     Nuswab pending   Relevant Medications   fluconazole (DIFLUCAN) 150 MG tablet   Other Relevant Orders   NuSwab Vaginitis (VG)      Meds ordered this encounter  Medications  . fluconazole (DIFLUCAN) 150 MG tablet    Sig: Take 1 tablet (150 mg total) by mouth once for 1 dose.     Dispense:  1 tablet    Refill:  0    Order Specific Question:   Supervising Provider    Answer:   Tresa Garter [id number]    Follow-up: No follow-ups on file.    Vevelyn Francois, NP

## 2020-10-16 NOTE — Patient Instructions (Signed)
Diabetes Mellitus and Nutrition, Adult When you have diabetes, or diabetes mellitus, it is very important to have healthy eating habits because your blood sugar (glucose) levels are greatly affected by what you eat and drink. Eating healthy foods in the right amounts, at about the same times every day, can help you:  Control your blood glucose.  Lower your risk of heart disease.  Improve your blood pressure.  Reach or maintain a healthy weight. What can affect my meal plan? Every person with diabetes is different, and each person has different needs for a meal plan. Your health care provider may recommend that you work with a dietitian to make a meal plan that is best for you. Your meal plan may vary depending on factors such as:  The calories you need.  The medicines you take.  Your weight.  Your blood glucose, blood pressure, and cholesterol levels.  Your activity level.  Other health conditions you have, such as heart or kidney disease. How do carbohydrates affect me? Carbohydrates, also called carbs, affect your blood glucose level more than any other type of food. Eating carbs naturally raises the amount of glucose in your blood. Carb counting is a method for keeping track of how many carbs you eat. Counting carbs is important to keep your blood glucose at a healthy level, especially if you use insulin or take certain oral diabetes medicines. It is important to know how many carbs you can safely have in each meal. This is different for every person. Your dietitian can help you calculate how many carbs you should have at each meal and for each snack. How does alcohol affect me? Alcohol can cause a sudden decrease in blood glucose (hypoglycemia), especially if you use insulin or take certain oral diabetes medicines. Hypoglycemia can be a life-threatening condition. Symptoms of hypoglycemia, such as sleepiness, dizziness, and confusion, are similar to symptoms of having too much  alcohol.  Do not drink alcohol if: ? Your health care provider tells you not to drink. ? You are pregnant, may be pregnant, or are planning to become pregnant.  If you drink alcohol: ? Do not drink on an empty stomach. ? Limit how much you use to:  0-1 drink a day for women.  0-2 drinks a day for men. ? Be aware of how much alcohol is in your drink. In the [place] one drink equals one 12 oz bottle of beer (355 mL), one 5 oz glass of wine (148 mL), or one 1 oz glass of hard liquor (44 mL). ? Keep yourself hydrated with water, diet soda, or unsweetened iced tea.  Keep in mind that regular soda, juice, and other mixers may contain a lot of sugar and must be counted as carbs. What are tips for following this plan? Reading food labels  Start by checking the serving size on the "Nutrition Facts" label of packaged foods and drinks. The amount of calories, carbs, fats, and other nutrients listed on the label is based on one serving of the item. Many items contain more than one serving per package.  Check the total grams (g) of carbs in one serving. You can calculate the number of servings of carbs in one serving by dividing the total carbs by 15. For example, if a food has 30 g of total carbs per serving, it would be equal to 2 servings of carbs.  Check the number of grams (g) of saturated fats and trans fats in one serving. Choose foods that have   a low amount or none of these fats.  Check the number of milligrams (mg) of salt (sodium) in one serving. Most people should limit total sodium intake to less than 2,300 mg per day.  Always check the nutrition information of foods labeled as "low-fat" or "nonfat." These foods may be higher in added sugar or refined carbs and should be avoided.  Talk to your dietitian to identify your daily goals for nutrients listed on the label. Shopping  Avoid buying canned, pre-made, or processed foods. These foods tend to be high in fat, sodium, and added  sugar.  Shop around the outside edge of the grocery store. This is where you will most often find fresh fruits and vegetables, bulk grains, fresh meats, and fresh dairy. Cooking  Use low-heat cooking methods, such as baking, instead of high-heat cooking methods like deep frying.  Cook using healthy oils, such as olive, canola, or sunflower oil.  Avoid cooking with butter, cream, or high-fat meats. Meal planning  Eat meals and snacks regularly, preferably at the same times every day. Avoid going long periods of time without eating.  Eat foods that are high in fiber, such as fresh fruits, vegetables, beans, and whole grains. Talk with your dietitian about how many servings of carbs you can eat at each meal.  Eat 4-6 oz (112-168 g) of lean protein each day, such as lean meat, chicken, fish, eggs, or tofu. One ounce (oz) of lean protein is equal to: ? 1 oz (28 g) of meat, chicken, or fish. ? 1 egg. ?  cup (62 g) of tofu.  Eat some foods each day that contain healthy fats, such as avocado, nuts, seeds, and fish.   What foods should I eat? Fruits Berries. Apples. Oranges. Peaches. Apricots. Plums. Grapes. Mango. Papaya. Pomegranate. Kiwi. Cherries. Vegetables Lettuce. Spinach. Leafy greens, including kale, chard, collard greens, and mustard greens. Beets. Cauliflower. Cabbage. Broccoli. Carrots. Green beans. Tomatoes. Peppers. Onions. Cucumbers. Brussels sprouts. Grains Whole grains, such as whole-wheat or whole-grain bread, crackers, tortillas, cereal, and pasta. Unsweetened oatmeal. Quinoa. Brown or wild rice. Meats and other proteins Seafood. Poultry without skin. Lean cuts of poultry and beef. Tofu. Nuts. Seeds. Dairy Low-fat or fat-free dairy products such as milk, yogurt, and cheese. The items listed above may not be a complete list of foods and beverages you can eat. Contact a dietitian for more information. What foods should I avoid? Fruits Fruits canned with  syrup. Vegetables Canned vegetables. Frozen vegetables with butter or cream sauce. Grains Refined white flour and flour products such as bread, pasta, snack foods, and cereals. Avoid all processed foods. Meats and other proteins Fatty cuts of meat. Poultry with skin. Breaded or fried meats. Processed meat. Avoid saturated fats. Dairy Full-fat yogurt, cheese, or milk. Beverages Sweetened drinks, such as soda or iced tea. The items listed above may not be a complete list of foods and beverages you should avoid. Contact a dietitian for more information. Questions to ask a health care provider  Do I need to meet with a diabetes educator?  Do I need to meet with a dietitian?  What number can I call if I have questions?  When are the best times to check my blood glucose? Where to find more information:  American Diabetes Association: diabetes.org  Academy of Nutrition and Dietetics: www.eatright.org  National Institute of Diabetes and Digestive and Kidney Diseases: www.niddk.nih.gov  Association of Diabetes Care and Education Specialists: www.diabeteseducator.org Summary  It is important to have healthy eating   habits because your blood sugar (glucose) levels are greatly affected by what you eat and drink.  A healthy meal plan will help you control your blood glucose and maintain a healthy lifestyle.  Your health care provider may recommend that you work with a dietitian to make a meal plan that is best for you.  Keep in mind that carbohydrates (carbs) and alcohol have immediate effects on your blood glucose levels. It is important to count carbs and to use alcohol carefully. This information is not intended to replace advice given to you by your health care provider. Make sure you discuss any questions you have with your health care provider. Document Revised: 07/09/2019 Document Reviewed: 07/09/2019 Elsevier Patient Education  2021 Elsevier Inc.  

## 2020-10-17 LAB — COMP. METABOLIC PANEL (12)
AST: 24 IU/L (ref 0–40)
Albumin/Globulin Ratio: 1.6 (ref 1.2–2.2)
Albumin: 4.6 g/dL (ref 3.8–4.9)
Alkaline Phosphatase: 53 IU/L (ref 44–121)
BUN/Creatinine Ratio: 14 (ref 9–23)
BUN: 13 mg/dL (ref 6–24)
Bilirubin Total: 0.3 mg/dL (ref 0.0–1.2)
Calcium: 9.4 mg/dL (ref 8.7–10.2)
Chloride: 102 mmol/L (ref 96–106)
Creatinine, Ser: 0.91 mg/dL (ref 0.57–1.00)
Globulin, Total: 2.8 g/dL (ref 1.5–4.5)
Glucose: 162 mg/dL — ABNORMAL HIGH (ref 65–99)
Potassium: 3.8 mmol/L (ref 3.5–5.2)
Sodium: 142 mmol/L (ref 134–144)
Total Protein: 7.4 g/dL (ref 6.0–8.5)
eGFR: 75 mL/min/{1.73_m2} (ref >59)

## 2020-10-17 LAB — MICROALBUMIN, URINE: Microalbumin, Urine: 51.8 ug/mL

## 2020-10-19 LAB — NUSWAB VAGINITIS (VG)
Candida albicans, NAA: NEGATIVE
Candida glabrata, NAA: NEGATIVE
Trich vag by NAA: NEGATIVE

## 2020-10-26 ENCOUNTER — Encounter: Payer: Self-pay | Admitting: Internal Medicine

## 2020-10-26 ENCOUNTER — Telehealth: Payer: Self-pay | Admitting: Internal Medicine

## 2020-10-26 ENCOUNTER — Ambulatory Visit (INDEPENDENT_AMBULATORY_CARE_PROVIDER_SITE_OTHER): Payer: 59 | Admitting: Internal Medicine

## 2020-10-26 ENCOUNTER — Other Ambulatory Visit: Payer: Self-pay

## 2020-10-26 VITALS — BP 100/80 | HR 65 | Ht 59.0 in | Wt 187.0 lb

## 2020-10-26 DIAGNOSIS — M791 Myalgia, unspecified site: Secondary | ICD-10-CM

## 2020-10-26 DIAGNOSIS — I10 Essential (primary) hypertension: Secondary | ICD-10-CM

## 2020-10-26 DIAGNOSIS — T466X5D Adverse effect of antihyperlipidemic and antiarteriosclerotic drugs, subsequent encounter: Secondary | ICD-10-CM

## 2020-10-26 DIAGNOSIS — R072 Precordial pain: Secondary | ICD-10-CM | POA: Diagnosis not present

## 2020-10-26 DIAGNOSIS — E669 Obesity, unspecified: Secondary | ICD-10-CM | POA: Diagnosis not present

## 2020-10-26 DIAGNOSIS — Z789 Other specified health status: Secondary | ICD-10-CM | POA: Diagnosis not present

## 2020-10-26 DIAGNOSIS — E782 Mixed hyperlipidemia: Secondary | ICD-10-CM | POA: Diagnosis not present

## 2020-10-26 DIAGNOSIS — E785 Hyperlipidemia, unspecified: Secondary | ICD-10-CM

## 2020-10-26 MED ORDER — FENOFIBRATE 48 MG PO TABS: 48.0000 mg | ORAL_TABLET | Freq: Every day | ORAL | 0 refills | Status: DC

## 2020-10-26 NOTE — Patient Instructions (Signed)
Medication Instructions:  No Changes In Medications at this time. *If you need a refill on your cardiac medications before your next appointment, please call your pharmacy*  Testing/Procedures: Your physician has requested that you have a lexiscan myoview. For further information please visit [email or web address]. Please follow instruction sheet, as given.  The test will take approximately 3 to 4 hours to complete; you may bring reading material.  If someone comes with you to your appointment, they will need to remain in the main lobby due to limited space in the testing area.    How to prepare for your Myocardial Perfusion Test:  Do not eat or drink 3 hours prior to your test, except you may have water.  Do not consume products containing caffeine (regular or decaffeinated) 12 hours prior to your test. (ex: coffee, chocolate, sodas, tea).  Do wear comfortable clothes (no dresses or overalls) and walking shoes, tennis shoes preferred (No heels or open toe shoes are allowed).  Do NOT wear cologne, perfume, aftershave, or lotions (deodorant is allowed).  If you use an inhaler, use it the AM of your test and bring it with you.   If you use a nebulizer, use it the AM of your test.   If these instructions are not followed, your test will have to be rescheduled.  Follow-Up: At [facility], you and your health needs are our priority.  As part of our continuing mission to provide you with exceptional heart care, we have created designated Provider Care Teams.  These Care Teams include your primary Cardiologist (physician) and Advanced Practice Providers (APPs -  Physician Assistants and Nurse Practitioners) who all work together to provide you with the care you need, when you need it.  Your next appointment:   6 month(s)  The format for your next appointment:   In Person  Provider:   Cherlynn Kaiser, MD

## 2020-10-26 NOTE — Progress Notes (Signed)
Cardiology Office Note:    Date:  10/26/2020   ID:  Jessica Lawson, DOB June 12, 1966, MRN [id number]  PCP:  Jessica Francois, NP  Cardiologist:  Elouise Munroe, MD  Electrophysiologist:  None   Referring MD: Jessica Francois, NP   Chief Complaint/Reason for Referral: Chest pain  History of Present Illness:    Jessica Lawson is a 55 y.o. female with a history of DM 2 on Metformin, hyperlipidemia/hypertriglyceridemia, hypertension.  She is experiencing resting chest pain that worsens with activity which she feels is limiting her ability to do her work. She is concerned about returning to work today. Prior description of chest pain was felt to be noncardiac given association with swallowing warm water. No prior exertional chest pain. Substernal chest pain, no radiation. The patient denies dyspnea at rest or with exertion, palpitations, PND, orthopnea, or leg swelling. Denies cough, fever, chills. Denies nausea, vomiting. Denies syncope or presyncope. Denies dizziness or lightheadedness.    Past Medical History:  Diagnosis Date   Allergy    Back pain    Diabetes mellitus without complication (HCC)    High blood cholesterol    Hypertension    Kidney stone    Normal cardiac stress test 02/2016   low risk   Obesity    Pain of cheek    and right side neck   Tobacco use    UTI (lower urinary tract infection)    Wrist pain    left wrist    Past Surgical History:  Procedure Laterality Date   CESAREAN SECTION     3 c-sections   DENTAL SURGERY     teeth removed with meds to calm     Current Medications: Current Meds  Medication Sig   ACCU-CHEK GUIDE test strip USE AS DIRECTED FOUR TIMES DAILY.   Accu-Chek Softclix Lancets lancets USE AS DIRECTED FOUR TIMES DAILY.   blood glucose meter kit and supplies KIT Dispense based on patient and insurance preference. Use up to four times daily as directed. (FOR ICD-9 250.00, 250.01).   blood glucose meter kit and supplies Dispense based  on patient and insurance preference. Use up to four times daily as directed. (FOR ICD-10 E10.9, E11.9).   cetirizine (ZYRTEC) 10 MG tablet TAKE 1 TABLET(10 MG) BY MOUTH DAILY   fenofibrate (TRICOR) 48 MG tablet Take 1 tablet (48 mg total) by mouth daily.   fluticasone (FLONASE) 50 MCG/ACT nasal spray Place 2 sprays into both nostrils daily.   hydrochlorothiazide (HYDRODIURIL) 25 MG tablet Take 1 tablet (25 mg total) by mouth daily.   loratadine (CLARITIN) 10 MG tablet Take 1 tablet (10 mg total) by mouth daily.   metFORMIN (GLUCOPHAGE) 500 MG tablet TAKE 1 TABLET(500 MG) BY MOUTH TWICE DAILY WITH A MEAL   methocarbamol (ROBAXIN) 500 MG tablet Take 1 tablet (500 mg total) by mouth 2 (two) times daily as needed for muscle spasms.   Multiple Vitamins-Minerals (MULTIVITAMIN WITH MINERALS) tablet Take 1 tablet by mouth daily.   omega-3 acid ethyl esters (LOVAZA) 1 g capsule TAKE 1 CAPSULE BY MOUTH EVERY DAY   ondansetron (ZOFRAN) 4 MG tablet Take 1 tablet (4 mg total) by mouth every 8 (eight) hours as needed for nausea or vomiting.   predniSONE (DELTASONE) 10 MG tablet Take 3 tablets (30 mg total) by mouth daily with breakfast.   PROAIR HFA 108 (90 Base) MCG/ACT inhaler INHALE 2 PUFFS INTO THE LUNGS EVERY 4 HOURS AS NEEDED FOR WHEEZING OR SHORTNESS OF BREATH  rosuvastatin (CRESTOR) 10 MG tablet Take 1 tablet (10 mg total) by mouth 3 (three) times a week.   valACYclovir (VALTREX) 1000 MG tablet Take 1 tablet (1,000 mg total) by mouth 2 (two) times daily.     Allergies:   Asa [aspirin], Iohexol, Penicillins, Sulfa antibiotics, and Nystatin   Social History   Tobacco Use   Smoking status: Former Smoker    Packs/day: 1.00    Types: Cigarettes    Quit date: 07/26/2016    Years since quitting: 4.2   Smokeless tobacco: Never Used  Vaping Use   Vaping Use: Never used  Substance Use Topics   Alcohol use: No   Drug use: No     Family History: The patient's family history includes Diabetes in  her mother, sister, sister, and another family member; Hyperlipidemia in an other family member; Hypertension in her sister and another family member; Lung disease in her father. There is no history of Colon cancer, Colon polyps, Esophageal cancer, Stomach cancer, or Rectal cancer.  ROS:   Please see the history of present illness.    All other systems reviewed and are negative.  EKGs/Labs/Other Studies Reviewed:    The following studies were reviewed today:  EKG:  NSR, sinus arrhythmia, nonspecific t wave abnl.   Recent Labs: 03/16/2020: Magnesium 1.7; TSH 0.605 06/16/2020: Hemoglobin 14.3; Platelets 420 10/16/2020: BUN 13; Creatinine, Ser 0.91; Potassium 3.8; Sodium 142  Recent Lipid Panel    Component Value Date/Time   CHOL 143 09/30/2020 0906   TRIG 228 (H) 09/30/2020 0906   HDL 36 (L) 09/30/2020 0906   CHOLHDL 4.0 09/30/2020 0906   CHOLHDL 7.3 (H) 07/12/2017 0950   VLDL 50 (H) 04/06/2017 0902   LDLCALC 70 09/30/2020 0906   LDLCALC 183 (H) 07/12/2017 0950    Physical Exam:    VS:  BP 100/80    Pulse 65    Ht $R'4\' 11"'Vj$  (1.499 m)    Wt 187 lb (84.8 kg)    LMP 08/22/2011 Comment: patient states LMP 20 years ago   SpO2 95%    BMI 37.77 kg/m     Wt Readings from Last 5 Encounters:  10/26/20 187 lb (84.8 kg)  10/16/20 183 lb 9.6 oz (83.3 kg)  07/02/20 186 lb (84.4 kg)  05/15/20 185 lb 3.2 oz (84 kg)  04/22/20 184 lb (83.5 kg)    Constitutional: No acute distress Eyes: sclera non-icteric, normal conjunctiva and lids ENMT: normal dentition, moist mucous membranes Cardiovascular: regular rhythm, normal rate, no murmurs. S1 and S2 normal. Radial pulses normal bilaterally. No jugular venous distention.  Respiratory: clear to auscultation bilaterally GI : normal bowel sounds, soft and nontender. No distention.   MSK: extremities warm, well perfused. No edema.  NEURO: grossly nonfocal exam, moves all extremities. PSYCH: alert and oriented x 3, normal mood and affect.   ASSESSMENT:     1. Precordial pain   2. Hypercholesterolemia with hypertriglyceridemia   3. Obesity (BMI 30-39.9)   4. Essential hypertension   5. Myalgia due to statin   6. Statin intolerance    PLAN:    Precordial pain - Plan: EKG 12-Lead, MYOCARDIAL PERFUSION IMAGING - discussed in shared decision making. She would prefer to have a stress test, given risk factors and intermediate risk for CAD. Will order a lexiscan myoview stress test.   Myalgia due to statin Statin intolerance Hypercholesterolemia with hypertriglyceridemia - follows with Dr. Hilty/lipid clinic. Added zetia 10 mg in addition to statin and fibrate. LDL now  at goal but triglycerides still quite elevated. He had planned for follow up with her in 6 mo, would recommend making follow up appt.   Essential hypertension - stable BP on HCTZ, continue if no significant hypotension.  Total time of encounter: 30 minutes total time of encounter, including 20 minutes spent in face-to-face patient care on the date of this encounter. This time includes coordination of care and counseling regarding above mentioned problem list. Remainder of non-face-to-face time involved reviewing chart documents/testing relevant to the patient encounter and documentation in the medical record. I have independently reviewed documentation from referring provider.   Cherlynn Kaiser, MD, [facility]    Medication Adjustments/Labs and Tests Ordered: Current medicines are reviewed at length with the patient today.  Concerns regarding medicines are outlined above.   Orders Placed This Encounter  Procedures   Cardiac Stress Test: Informed Consent Details: Physician/Practitioner Attestation; Transcribe to consent form and obtain patient signature   MYOCARDIAL PERFUSION IMAGING   EKG 12-Lead    Shared Decision Making/Informed Consent:   Shared Decision Making/Informed Consent The risks [chest pain, shortness of breath, cardiac arrhythmias,  dizziness, blood pressure fluctuations, myocardial infarction, stroke/transient ischemic attack, nausea, vomiting, allergic reaction, radiation exposure, metallic taste sensation and life-threatening complications (estimated to be 1 in 10,000)], benefits (risk stratification, diagnosing coronary artery disease, treatment guidance) and alternatives of a nuclear stress test were discussed in detail with Ms. Latchford and she agrees to proceed.   No orders of the defined types were placed in this encounter.   Patient Instructions  Medication Instructions:  No Changes In Medications at this time. *If you need a refill on your cardiac medications before your next appointment, please call your pharmacy*  Testing/Procedures: Your physician has requested that you have a lexiscan myoview. For further information please visit [email or web address]. Please follow instruction sheet, as given.  The test will take approximately 3 to 4 hours to complete; you may bring reading material.  If someone comes with you to your appointment, they will need to remain in the main lobby due to limited space in the testing area.    How to prepare for your Myocardial Perfusion Test: Do not eat or drink 3 hours prior to your test, except you may have water. Do not consume products containing caffeine (regular or decaffeinated) 12 hours prior to your test. (ex: coffee, chocolate, sodas, tea). Do wear comfortable clothes (no dresses or overalls) and walking shoes, tennis shoes preferred (No heels or open toe shoes are allowed). Do NOT wear cologne, perfume, aftershave, or lotions (deodorant is allowed). If you use an inhaler, use it the AM of your test and bring it with you.  If you use a nebulizer, use it the AM of your test.  If these instructions are not followed, your test will have to be rescheduled.  Follow-Up: At [facility], you and your health needs are our priority.  As part of our continuing mission to provide  you with exceptional heart care, we have created designated Provider Care Teams.  These Care Teams include your primary Cardiologist (physician) and Advanced Practice Providers (APPs -  Physician Assistants and Nurse Practitioners) who all work together to provide you with the care you need, when you need it.  Your next appointment:   6 month(s)  The format for your next appointment:   In Person  Provider:   Cherlynn Kaiser, MD

## 2020-10-26 NOTE — Telephone Encounter (Signed)
Spoke with patient regarding Leane Call scheduled Wednesday 11/11/20 at 10:45 am at [address], [address].  Will mail information to patient and it is also available in My Chart.  Patient voiced her understanding.

## 2020-11-04 ENCOUNTER — Telehealth (HOSPITAL_COMMUNITY): Payer: Self-pay | Admitting: *Deleted

## 2020-11-04 NOTE — Telephone Encounter (Signed)
Left message on voicemail in reference to upcoming appointment scheduled for 11/11/21. Phone number given for a call back so details instructions can be given. Taylan Mayhan, Ranae Palms

## 2020-11-09 ENCOUNTER — Telehealth (HOSPITAL_COMMUNITY): Payer: Self-pay | Admitting: *Deleted

## 2020-11-09 NOTE — Telephone Encounter (Signed)
Left message on voicemail in reference to upcoming appointment scheduled for 11/11/20. Phone number given for a call back so details instructions can be given.  Jessica Lawson

## 2020-11-11 ENCOUNTER — Encounter (HOSPITAL_COMMUNITY): Payer: Medicaid Other

## 2020-11-20 ENCOUNTER — Other Ambulatory Visit: Payer: Self-pay | Admitting: Nurse Practitioner

## 2020-11-20 DIAGNOSIS — E785 Hyperlipidemia, unspecified: Secondary | ICD-10-CM

## 2020-11-25 NOTE — Progress Notes (Signed)
(  Key: [id number])Need help? Call us at [phone] Status Sent to [employer] The plan will fax you a determination, typically within 1 to 5 business days.  How do I follow up? Drug Fenofibrate 48MG  tablets Form [facility] Prior Approval Request for Standard Drug Request Form Pharmacy Prior Approval Request for Standard Drug Request Form for [place] Medicaid and [place] [phone]

## 2020-12-02 NOTE — Telephone Encounter (Signed)
Called an spoke w/ pt regarding  medication , pt will need to have pa done

## 2020-12-09 ENCOUNTER — Other Ambulatory Visit: Payer: Self-pay

## 2020-12-09 ENCOUNTER — Ambulatory Visit
Admission: RE | Admit: 2020-12-09 | Discharge: 2020-12-09 | Disposition: A | Discharge status: Home / Self Care | Payer: Medicaid Other | Source: Ambulatory Visit | Attending: Nurse Practitioner | Admitting: Nurse Practitioner

## 2020-12-09 DIAGNOSIS — Z1231 Encounter for screening mammogram for malignant neoplasm of breast: Secondary | ICD-10-CM

## 2020-12-10 ENCOUNTER — Other Ambulatory Visit: Payer: Self-pay

## 2020-12-10 DIAGNOSIS — E785 Hyperlipidemia, unspecified: Secondary | ICD-10-CM

## 2020-12-10 MED ORDER — OMEGA-3-ACID ETHYL ESTERS 1 G PO CAPS: ORAL_CAPSULE | ORAL | 1 refills | Status: DC

## 2020-12-28 ENCOUNTER — Other Ambulatory Visit: Payer: Self-pay

## 2020-12-28 DIAGNOSIS — J0100 Acute maxillary sinusitis, unspecified: Secondary | ICD-10-CM

## 2020-12-28 MED ORDER — CETIRIZINE HCL 10 MG PO TABS: ORAL_TABLET | ORAL | 3 refills | Status: DC

## 2021-01-15 ENCOUNTER — Ambulatory Visit: Payer: Medicaid Other | Admitting: Nurse Practitioner

## 2021-03-08 ENCOUNTER — Other Ambulatory Visit: Payer: Self-pay

## 2021-03-08 DIAGNOSIS — I10 Essential (primary) hypertension: Secondary | ICD-10-CM

## 2021-03-08 MED ORDER — HYDROCHLOROTHIAZIDE 25 MG PO TABS: 25.0000 mg | ORAL_TABLET | Freq: Every day | ORAL | 1 refills | Status: DC

## 2021-03-10 ENCOUNTER — Ambulatory Visit (INDEPENDENT_AMBULATORY_CARE_PROVIDER_SITE_OTHER): Payer: 59 | Admitting: Nurse Practitioner

## 2021-03-10 ENCOUNTER — Other Ambulatory Visit: Payer: Self-pay

## 2021-03-10 ENCOUNTER — Encounter: Payer: Self-pay | Admitting: Nurse Practitioner

## 2021-03-10 VITALS — BP 147/82 | HR 78 | Temp 97.2°F | Ht 59.0 in | Wt 182.1 lb

## 2021-03-10 DIAGNOSIS — B009 Herpesviral infection, unspecified: Secondary | ICD-10-CM | POA: Diagnosis not present

## 2021-03-10 DIAGNOSIS — I10 Essential (primary) hypertension: Secondary | ICD-10-CM

## 2021-03-10 DIAGNOSIS — R5383 Other fatigue: Secondary | ICD-10-CM

## 2021-03-10 DIAGNOSIS — M79672 Pain in left foot: Secondary | ICD-10-CM | POA: Diagnosis not present

## 2021-03-10 DIAGNOSIS — E119 Type 2 diabetes mellitus without complications: Secondary | ICD-10-CM

## 2021-03-10 DIAGNOSIS — R82998 Other abnormal findings in urine: Secondary | ICD-10-CM | POA: Diagnosis not present

## 2021-03-10 DIAGNOSIS — J0100 Acute maxillary sinusitis, unspecified: Secondary | ICD-10-CM

## 2021-03-10 DIAGNOSIS — E782 Mixed hyperlipidemia: Secondary | ICD-10-CM | POA: Diagnosis not present

## 2021-03-10 LAB — POCT URINALYSIS DIPSTICK
Bilirubin, UA: NEGATIVE
Blood, UA: NEGATIVE
Glucose, UA: NEGATIVE
Ketones, UA: NEGATIVE
Nitrite, UA: NEGATIVE
Protein, UA: POSITIVE — AB
Spec Grav, UA: 1.025 (ref 1.010–1.025)
Urobilinogen, UA: 2 E.U./dL — AB
pH, UA: 6.5 (ref 5.0–8.0)

## 2021-03-10 LAB — POCT GLYCOSYLATED HEMOGLOBIN (HGB A1C)
HbA1c POC (<> result, manual entry): 7.9 % (ref 4.0–5.6)
HbA1c, POC (controlled diabetic range): 7.9 % — AB (ref 0.0–7.0)
HbA1c, POC (prediabetic range): 7.9 % — AB (ref 5.7–6.4)
Hemoglobin A1C: 7.9 % — AB (ref 4.0–5.6)

## 2021-03-10 LAB — GLUCOSE, POCT (MANUAL RESULT ENTRY): POC Glucose: 127 mg/dl — AB (ref 70–99)

## 2021-03-10 MED ORDER — VALACYCLOVIR HCL 1 G PO TABS: 1000.0000 mg | ORAL_TABLET | Freq: Two times a day (BID) | ORAL | 0 refills | Status: DC

## 2021-03-10 MED ORDER — CETIRIZINE HCL 10 MG PO TABS: ORAL_TABLET | ORAL | 3 refills | Status: DC

## 2021-03-10 MED ORDER — HYDROCHLOROTHIAZIDE 25 MG PO TABS: 25.0000 mg | ORAL_TABLET | Freq: Every day | ORAL | 3 refills | Status: DC

## 2021-03-10 MED ORDER — ROSUVASTATIN CALCIUM 10 MG PO TABS: 10.0000 mg | ORAL_TABLET | ORAL | 2 refills | Status: DC

## 2021-03-10 MED ORDER — METFORMIN HCL 500 MG PO TABS: 500.0000 mg | ORAL_TABLET | Freq: Two times a day (BID) | ORAL | 3 refills | Status: DC

## 2021-03-10 NOTE — Progress Notes (Signed)
 [facility] [address] [place], [place]  [address] Phone:  [phone]   Fax:  [phone]    Established Patient Office Visit  Subjective:  Patient ID: Jessica Lawson, female    DOB: 11/21/1965  Age: 55 y.o. MRN: [id number]  CC:  Chief Complaint  Patient presents with   Follow-up    Left foot pain,still getting charley horses with chest pain. Stated she just does not feel good today requesting note to be out of work for today    HPI Satia Lecker presents for follow up. She  has a past medical history of Allergy, Back pain, Diabetes mellitus without complication (HCC), High blood cholesterol, Hypertension, Kidney stone, Normal cardiac stress test (02/2016), Obesity, Pain of cheek, Tobacco use, UTI (lower urinary tract infection), and Wrist pain.    She is having soreness in her left foot. She has catch type feeling in her left foot. She feels like this brings on chest pain.The chest pain resolves after the foot pain resolves. She reports being under increased stress; work related.    She is currently being treated for diabetes on metformin 500 mg twice daily along with rosuvastatin 10 mg 3 times per week , Zetia 10 mg and fenofibrate 48 mg daily.  She reports that she is trying to be compliant with her diet and her weight is down 5 pounds.  She is concerned because she remains in the 180s.  She reports exercising.  She is being treated for hypertension with hydrochlorothiazide 25 mg daily.  Current blood pressure slightly elevated 147/82. Denies headache, dizziness, visual changes, nausea, vomiting or any edema.    Past Medical History:  Diagnosis Date   Allergy    Back pain    Diabetes mellitus without complication (HCC)    High blood cholesterol    Hypertension    Kidney stone    Normal cardiac stress test 02/2016   low risk   Obesity    Pain of cheek    and right side neck   Tobacco use    UTI (lower urinary tract infection)    Wrist pain     left wrist    Past Surgical History:  Procedure Laterality Date   CESAREAN SECTION     3 c-sections   DENTAL SURGERY     teeth removed with meds to calm     Family History  Problem Relation Age of Onset   Hyperlipidemia Other    Hypertension Other    Diabetes Other    Diabetes Mother    Lung disease Father    Diabetes Sister    Diabetes Sister    Hypertension Sister    Colon cancer Neg Hx    Colon polyps Neg Hx    Esophageal cancer Neg Hx    Stomach cancer Neg Hx    Rectal cancer Neg Hx     Social History   Socioeconomic History   Marital status: Single    Spouse name: Not on file   Number of children: Not on file   Years of education: Not on file   Highest education level: Not on file  Occupational History   Not on file  Tobacco Use   Smoking status: Former    Packs/day: 1.00    Types: Cigarettes    Quit date: 07/26/2016    Years since quitting: 4.6   Smokeless tobacco: Never  Vaping Use   Vaping Use: Never used  Substance and Sexual Activity     Alcohol use: No   Drug use: No   Sexual activity: Not Currently  Other Topics Concern   Not on file  Social History Narrative   Not on file   Social Determinants of Health   Financial Resource Strain: Not on file  Food Insecurity: Not on file  Transportation Needs: Not on file  Physical Activity: Not on file  Stress: Not on file  Social Connections: Not on file  Intimate Partner Violence: Not on file    Outpatient Medications Prior to Visit  Medication Sig Dispense Refill   ACCU-CHEK GUIDE test strip USE AS DIRECTED FOUR TIMES DAILY. 100 strip 3   Accu-Chek Softclix Lancets lancets USE AS DIRECTED FOUR TIMES DAILY. 100 each 3   blood glucose meter kit and supplies KIT Dispense based on patient and insurance preference. Use up to four times daily as directed. (FOR ICD-9 250.00, 250.01). 1 each 0   fluticasone (FLONASE) 50 MCG/ACT nasal spray Place 2 sprays into both nostrils daily. 16 g 6   loratadine  (CLARITIN) 10 MG tablet Take 1 tablet (10 mg total) by mouth daily. 30 tablet 11   methocarbamol (ROBAXIN) 500 MG tablet Take 1 tablet (500 mg total) by mouth 2 (two) times daily as needed for muscle spasms. 30 tablet 2   Multiple Vitamins-Minerals (MULTIVITAMIN WITH MINERALS) tablet Take 1 tablet by mouth daily.     omega-3 acid ethyl esters (LOVAZA) 1 g capsule TAKE 1 CAPSULE BY MOUTH EVERY DAY 90 capsule 1   ondansetron (ZOFRAN) 4 MG tablet Take 1 tablet (4 mg total) by mouth every 8 (eight) hours as needed for nausea or vomiting. 30 tablet 1   PROAIR HFA 108 (90 Base) MCG/ACT inhaler INHALE 2 PUFFS INTO THE LUNGS EVERY 4 HOURS AS NEEDED FOR WHEEZING OR SHORTNESS OF BREATH 8.5 g 3   blood glucose meter kit and supplies Dispense based on patient and insurance preference. Use up to four times daily as directed. (FOR ICD-10 E10.9, E11.9). 1 each 0   cetirizine (ZYRTEC) 10 MG tablet TAKE 1 TABLET(10 MG) BY MOUTH DAILY 30 tablet 3   hydrochlorothiazide (HYDRODIURIL) 25 MG tablet Take 1 tablet (25 mg total) by mouth daily. 90 tablet 1   metFORMIN (GLUCOPHAGE) 500 MG tablet TAKE 1 TABLET(500 MG) BY MOUTH TWICE DAILY WITH A MEAL 180 tablet 1   rosuvastatin (CRESTOR) 10 MG tablet Take 1 tablet (10 mg total) by mouth 3 (three) times a week. 30 tablet 2   valACYclovir (VALTREX) 1000 MG tablet Take 1 tablet (1,000 mg total) by mouth 2 (two) times daily. 20 tablet 0   ANEFRIN NASAL SPRAY 0.05 % nasal spray Place into both nostrils.     ascorbic acid (VITAMIN C) 1000 MG tablet Take 2,000 mg by mouth daily.     Cholecalciferol (VITAMIN D3) 50 MCG (2000 UT) TABS Take 1 tablet by mouth daily.     diclofenac Sodium (VOLTAREN) 1 % GEL APPLY TO LEFT SIDED FOOT TWICE DAILY FOR 5 DAYS AS NEEDED     ezetimibe (ZETIA) 10 MG tablet Take 1 tablet (10 mg total) by mouth daily. 90 tablet 3   fenofibrate (TRICOR) 48 MG tablet TAKE 1 TABLET(48 MG) BY MOUTH DAILY 90 tablet 3   Zinc 50 MG TABS Take 1 tablet by mouth daily.      predniSONE (DELTASONE) 10 MG tablet Take 3 tablets (30 mg total) by mouth daily with breakfast. 15 tablet 0   No facility-administered medications prior to visit.      Allergies  Allergen Reactions   Asa [Aspirin] Anaphylaxis, Hives and Swelling   Iohexol Other (See Comments)     Desc: pt complains of difficulty swallowing and thickened tongue/ throat closes up    Penicillins Hives, Swelling and Other (See Comments)    Thrush    Sulfa Antibiotics Anaphylaxis, Hives, Swelling and Other (See Comments)   Nystatin Other (See Comments)    thrush    ROS Review of Systems    Objective:    Physical Exam Constitutional:      Appearance: She is obese.  HENT:     Head: Normocephalic and atraumatic.     Nose: Nose normal.     Mouth/Throat:     Mouth: Mucous membranes are moist.  Cardiovascular:     Rate and Rhythm: Normal rate and regular rhythm.     Pulses: Normal pulses.     Heart sounds: Normal heart sounds.  Pulmonary:     Effort: Pulmonary effort is normal.     Comments: Diminished breath sounds Abdominal:     General: Bowel sounds are normal.     Palpations: Abdomen is soft.  Musculoskeletal:        General: Normal range of motion.     Cervical back: Normal range of motion.  Skin:    General: Skin is warm and dry.     Capillary Refill: Capillary refill takes less than 2 seconds.  Neurological:     General: No focal deficit present.     Mental Status: She is alert and oriented to person, place, and time.  Psychiatric:        Mood and Affect: Mood normal.        Behavior: Behavior normal.        Thought Content: Thought content normal.        Judgment: Judgment normal.    BP (!) 147/82 (BP Location: Right Arm, Patient Position: Sitting)   Pulse 78   Temp (!) 97.2 F (36.2 C)   Ht 4' 11" (1.499 m)   Wt 182 lb 0.8 oz (82.6 kg)   LMP 08/22/2011 Comment: patient states LMP 20 years ago  SpO2 99%   BMI 36.77 kg/m  Wt Readings from Last 3 Encounters:  03/10/21  182 lb 0.8 oz (82.6 kg)  10/26/20 187 lb (84.8 kg)  10/16/20 183 lb 9.6 oz (83.3 kg)     There are no preventive care reminders to display for this patient.   There are no preventive care reminders to display for this patient.  Lab Results  Component Value Date   TSH 0.605 03/16/2020   Lab Results  Component Value Date   WBC 7.6 06/16/2020   HGB 14.3 06/16/2020   HCT 42.5 06/16/2020   MCV 84 06/16/2020   PLT 420 06/16/2020   Lab Results  Component Value Date   NA 142 10/16/2020   K 3.8 10/16/2020   CO2 25 06/04/2019   GLUCOSE 162 (H) 10/16/2020   BUN 13 10/16/2020   CREATININE 0.91 10/16/2020   BILITOT 0.3 10/16/2020   ALKPHOS 53 10/16/2020   AST 24 10/16/2020   ALT 32 06/04/2019   PROT 7.4 10/16/2020   ALBUMIN 4.6 10/16/2020   CALCIUM 9.4 10/16/2020   ANIONGAP 11 05/18/2018   EGFR 75 10/16/2020   Lab Results  Component Value Date   CHOL 143 09/30/2020   Lab Results  Component Value Date   HDL 36 (L) 09/30/2020   Lab Results  Component Value Date   LDLCALC   70 09/30/2020   Lab Results  Component Value Date   TRIG 228 (H) 09/30/2020   Lab Results  Component Value Date   CHOLHDL 4.0 09/30/2020   Lab Results  Component Value Date   HGBA1C 7.9 (A) 03/10/2021   HGBA1C 7.9 03/10/2021   HGBA1C 7.9 (A) 03/10/2021   HGBA1C 7.9 (A) 03/10/2021      Assessment & Plan:   Problem List Items Addressed This Visit       Cardiovascular and Mediastinum   Essential hypertension - Primary Persistent Encouraged on going compliance with current medication regimen Encouraged home monitoring and recording BP <130/80 Eating a heart-healthy diet with less salt Encouraged regular physical activity  Recommend Weight loss     Relevant Medications   rosuvastatin (CRESTOR) 10 MG tablet   hydrochlorothiazide (HYDRODIURIL) 25 MG tablet   Other Relevant Orders   Urinalysis Dipstick (Completed)   Comp. Metabolic Panel (12)     Other    Hyperlipidemia Stable Continue to encourage heart healthy diet And compliance with medication   Relevant Medications   rosuvastatin (CRESTOR) 10 MG tablet   hydrochlorothiazide (HYDRODIURIL) 25 MG tablet   Other Relevant Orders   Lipid panel   Herpes simplex type 1 infection   Relevant Medications   valACYclovir (VALTREX) 1000 MG tablet   Other Visit Diagnoses     Type 2 diabetes mellitus without complication, without long-term current use of insulin (HCC)    Controlled however A1c 7.9% down from 8.0% 8 months ago Encourage compliance with current treatment regimen   Encourage regular CBG monitoring Encourage contacting office if excessive hyperglycemia and or hypoglycemia Lifestyle modification with healthy diet (fewer calories, more high fiber foods, whole grains and non-starchy vegetables, lower fat meat and fish, low-fat diary include healthy oils) regular exercise (physical activity) and weight loss Opthalmology exam discussed  Nutritional consult recommended Regular dental visits encouraged Home BP monitoring also encouraged goal <130/80     Relevant Medications   rosuvastatin (CRESTOR) 10 MG tablet   metFORMIN (GLUCOPHAGE) 500 MG tablet   Other Relevant Orders   Urinalysis Dipstick (Completed)   HgB A1c (Completed)   Glucose (CBG) (Completed)   Lipid panel   Fatigue, unspecified type       Relevant Orders   CBC with Differential/Platelet   TSH   Urine white blood cells increased       Relevant Orders   Urine Culture   Acute non-recurrent maxillary sinusitis       Relevant Medications   ANEFRIN NASAL SPRAY 0.05 % nasal spray   valACYclovir (VALTREX) 1000 MG tablet   cetirizine (ZYRTEC) 10 MG tablet   Left foot pain Images completed at [facility]  We will continue to monitor symptoms persist or get worse referral to podiatry for further evaluation  Work note provided    Meds ordered this encounter  Medications   rosuvastatin (CRESTOR) 10 MG  tablet    Sig: Take 1 tablet (10 mg total) by mouth 3 (three) times a week.    Dispense:  30 tablet    Refill:  2    Order Specific Question:   Supervising Provider    Answer:   Tresa Garter [[id number]]   valACYclovir (VALTREX) 1000 MG tablet    Sig: Take 1 tablet (1,000 mg total) by mouth 2 (two) times daily.    Dispense:  20 tablet    Refill:  0    Order Specific Question:   Supervising Provider    Answer:  JEGEDE, OLUGBEMIGA E [[id number]]   metFORMIN (GLUCOPHAGE) 500 MG tablet    Sig: Take 1 tablet (500 mg total) by mouth 2 (two) times daily with a meal.    Dispense:  180 tablet    Refill:  3    Order Specific Question:   Supervising Provider    Answer:   JEGEDE, OLUGBEMIGA E [[id number]]   hydrochlorothiazide (HYDRODIURIL) 25 MG tablet    Sig: Take 1 tablet (25 mg total) by mouth daily.    Dispense:  90 tablet    Refill:  3    Order Specific Question:   Supervising Provider    Answer:   JEGEDE, OLUGBEMIGA E [[id number]]   cetirizine (ZYRTEC) 10 MG tablet    Sig: TAKE 1 TABLET(10 MG) BY MOUTH DAILY    Dispense:  90 tablet    Refill:  3    Order Specific Question:   Supervising Provider    Answer:   JEGEDE, OLUGBEMIGA E [[id number]]    Follow-up: No follow-ups on file.    Crystal M King, NP 

## 2021-03-10 NOTE — Patient Instructions (Signed)
Diabetes Mellitus and Nutrition, Adult When you have diabetes, or diabetes mellitus, it is very important to have healthy eating habits because your blood sugar (glucose) levels are greatly affected by what you eat and drink. Eating healthy foods in the right amounts, at about the same times every day, can help you:  Control your blood glucose.  Lower your risk of heart disease.  Improve your blood pressure.  Reach or maintain a healthy weight. What can affect my meal plan? Every person with diabetes is different, and each person has different needs for a meal plan. Your health care provider may recommend that you work with a dietitian to make a meal plan that is best for you. Your meal plan may vary depending on factors such as:  The calories you need.  The medicines you take.  Your weight.  Your blood glucose, blood pressure, and cholesterol levels.  Your activity level.  Other health conditions you have, such as heart or kidney disease. How do carbohydrates affect me? Carbohydrates, also called carbs, affect your blood glucose level more than any other type of food. Eating carbs naturally raises the amount of glucose in your blood. Carb counting is a method for keeping track of how many carbs you eat. Counting carbs is important to keep your blood glucose at a healthy level, especially if you use insulin or take certain oral diabetes medicines. It is important to know how many carbs you can safely have in each meal. This is different for every person. Your dietitian can help you calculate how many carbs you should have at each meal and for each snack. How does alcohol affect me? Alcohol can cause a sudden decrease in blood glucose (hypoglycemia), especially if you use insulin or take certain oral diabetes medicines. Hypoglycemia can be a life-threatening condition. Symptoms of hypoglycemia, such as sleepiness, dizziness, and confusion, are similar to symptoms of having too much  alcohol.  Do not drink alcohol if: ? Your health care provider tells you not to drink. ? You are pregnant, may be pregnant, or are planning to become pregnant.  If you drink alcohol: ? Do not drink on an empty stomach. ? Limit how much you use to:  0-1 drink a day for women.  0-2 drinks a day for men. ? Be aware of how much alcohol is in your drink. In the [place] one drink equals one 12 oz bottle of beer (355 mL), one 5 oz glass of wine (148 mL), or one 1 oz glass of hard liquor (44 mL). ? Keep yourself hydrated with water, diet soda, or unsweetened iced tea.  Keep in mind that regular soda, juice, and other mixers may contain a lot of sugar and must be counted as carbs. What are tips for following this plan? Reading food labels  Start by checking the serving size on the "Nutrition Facts" label of packaged foods and drinks. The amount of calories, carbs, fats, and other nutrients listed on the label is based on one serving of the item. Many items contain more than one serving per package.  Check the total grams (g) of carbs in one serving. You can calculate the number of servings of carbs in one serving by dividing the total carbs by 15. For example, if a food has 30 g of total carbs per serving, it would be equal to 2 servings of carbs.  Check the number of grams (g) of saturated fats and trans fats in one serving. Choose foods that have   a low amount or none of these fats.  Check the number of milligrams (mg) of salt (sodium) in one serving. Most people should limit total sodium intake to less than 2,300 mg per day.  Always check the nutrition information of foods labeled as "low-fat" or "nonfat." These foods may be higher in added sugar or refined carbs and should be avoided.  Talk to your dietitian to identify your daily goals for nutrients listed on the label. Shopping  Avoid buying canned, pre-made, or processed foods. These foods tend to be high in fat, sodium, and added  sugar.  Shop around the outside edge of the grocery store. This is where you will most often find fresh fruits and vegetables, bulk grains, fresh meats, and fresh dairy. Cooking  Use low-heat cooking methods, such as baking, instead of high-heat cooking methods like deep frying.  Cook using healthy oils, such as olive, canola, or sunflower oil.  Avoid cooking with butter, cream, or high-fat meats. Meal planning  Eat meals and snacks regularly, preferably at the same times every day. Avoid going long periods of time without eating.  Eat foods that are high in fiber, such as fresh fruits, vegetables, beans, and whole grains. Talk with your dietitian about how many servings of carbs you can eat at each meal.  Eat 4-6 oz (112-168 g) of lean protein each day, such as lean meat, chicken, fish, eggs, or tofu. One ounce (oz) of lean protein is equal to: ? 1 oz (28 g) of meat, chicken, or fish. ? 1 egg. ?  cup (62 g) of tofu.  Eat some foods each day that contain healthy fats, such as avocado, nuts, seeds, and fish.   What foods should I eat? Fruits Berries. Apples. Oranges. Peaches. Apricots. Plums. Grapes. Mango. Papaya. Pomegranate. Kiwi. Cherries. Vegetables Lettuce. Spinach. Leafy greens, including kale, chard, collard greens, and mustard greens. Beets. Cauliflower. Cabbage. Broccoli. Carrots. Green beans. Tomatoes. Peppers. Onions. Cucumbers. Brussels sprouts. Grains Whole grains, such as whole-wheat or whole-grain bread, crackers, tortillas, cereal, and pasta. Unsweetened oatmeal. Quinoa. Brown or wild rice. Meats and other proteins Seafood. Poultry without skin. Lean cuts of poultry and beef. Tofu. Nuts. Seeds. Dairy Low-fat or fat-free dairy products such as milk, yogurt, and cheese. The items listed above may not be a complete list of foods and beverages you can eat. Contact a dietitian for more information. What foods should I avoid? Fruits Fruits canned with  syrup. Vegetables Canned vegetables. Frozen vegetables with butter or cream sauce. Grains Refined white flour and flour products such as bread, pasta, snack foods, and cereals. Avoid all processed foods. Meats and other proteins Fatty cuts of meat. Poultry with skin. Breaded or fried meats. Processed meat. Avoid saturated fats. Dairy Full-fat yogurt, cheese, or milk. Beverages Sweetened drinks, such as soda or iced tea. The items listed above may not be a complete list of foods and beverages you should avoid. Contact a dietitian for more information. Questions to ask a health care provider  Do I need to meet with a diabetes educator?  Do I need to meet with a dietitian?  What number can I call if I have questions?  When are the best times to check my blood glucose? Where to find more information:  American Diabetes Association: diabetes.org  Academy of Nutrition and Dietetics: www.eatright.org  National Institute of Diabetes and Digestive and Kidney Diseases: www.niddk.nih.gov  Association of Diabetes Care and Education Specialists: www.diabeteseducator.org Summary  It is important to have healthy eating   habits because your blood sugar (glucose) levels are greatly affected by what you eat and drink.  A healthy meal plan will help you control your blood glucose and maintain a healthy lifestyle.  Your health care provider may recommend that you work with a dietitian to make a meal plan that is best for you.  Keep in mind that carbohydrates (carbs) and alcohol have immediate effects on your blood glucose levels. It is important to count carbs and to use alcohol carefully. This information is not intended to replace advice given to you by your health care provider. Make sure you discuss any questions you have with your health care provider. Document Revised: 07/09/2019 Document Reviewed: 07/09/2019 Elsevier Patient Education  2021 Elsevier Inc.  

## 2021-03-11 LAB — CBC WITH DIFFERENTIAL/PLATELET
Basophils Absolute: 0 10*3/uL (ref 0.0–0.2)
Basos: 1 %
EOS (ABSOLUTE): 0.1 10*3/uL (ref 0.0–0.4)
Eos: 1 %
Hematocrit: 42 % (ref 34.0–46.6)
Hemoglobin: 14.1 g/dL (ref 11.1–15.9)
Immature Grans (Abs): 0 10*3/uL (ref 0.0–0.1)
Immature Granulocytes: 0 %
Lymphocytes Absolute: 3.9 10*3/uL — ABNORMAL HIGH (ref 0.7–3.1)
Lymphs: 60 %
MCH: 28.3 pg (ref 26.6–33.0)
MCHC: 33.6 g/dL (ref 31.5–35.7)
MCV: 84 fL (ref 79–97)
Monocytes Absolute: 0.5 10*3/uL (ref 0.1–0.9)
Monocytes: 8 %
Neutrophils Absolute: 2 10*3/uL (ref 1.4–7.0)
Neutrophils: 30 %
Platelets: 340 10*3/uL (ref 150–450)
RBC: 4.99 x10E6/uL (ref 3.77–5.28)
RDW: 13.7 % (ref 11.7–15.4)
WBC: 6.6 10*3/uL (ref 3.4–10.8)

## 2021-03-11 LAB — LIPID PANEL
Chol/HDL Ratio: 3.6 ratio (ref 0.0–4.4)
Cholesterol, Total: 121 mg/dL (ref 100–199)
HDL: 34 mg/dL — ABNORMAL LOW (ref >39)
LDL Chol Calc (NIH): 57 mg/dL (ref 0–99)
Triglycerides: 180 mg/dL — ABNORMAL HIGH (ref 0–149)
VLDL Cholesterol Cal: 30 mg/dL (ref 5–40)

## 2021-03-11 LAB — COMP. METABOLIC PANEL (12)
AST: 36 IU/L (ref 0–40)
Albumin/Globulin Ratio: 1.8 (ref 1.2–2.2)
Albumin: 4.8 g/dL (ref 3.8–4.9)
Alkaline Phosphatase: 52 IU/L (ref 44–121)
BUN/Creatinine Ratio: 18 (ref 9–23)
BUN: 14 mg/dL (ref 6–24)
Bilirubin Total: 0.3 mg/dL (ref 0.0–1.2)
Calcium: 10 mg/dL (ref 8.7–10.2)
Chloride: 99 mmol/L (ref 96–106)
Creatinine, Ser: 0.78 mg/dL (ref 0.57–1.00)
Globulin, Total: 2.7 g/dL (ref 1.5–4.5)
Glucose: 107 mg/dL — ABNORMAL HIGH (ref 65–99)
Potassium: 3.9 mmol/L (ref 3.5–5.2)
Sodium: 139 mmol/L (ref 134–144)
Total Protein: 7.5 g/dL (ref 6.0–8.5)
eGFR: 90 mL/min/{1.73_m2} (ref >59)

## 2021-03-11 LAB — TSH: TSH: 0.706 u[IU]/mL (ref 0.450–4.500)

## 2021-03-12 LAB — URINE CULTURE

## 2021-03-24 ENCOUNTER — Other Ambulatory Visit: Payer: Self-pay | Admitting: Internal Medicine

## 2021-04-14 ENCOUNTER — Ambulatory Visit (INDEPENDENT_AMBULATORY_CARE_PROVIDER_SITE_OTHER): Payer: 59 | Admitting: Internal Medicine

## 2021-04-14 ENCOUNTER — Encounter (HOSPITAL_BASED_OUTPATIENT_CLINIC_OR_DEPARTMENT_OTHER): Payer: Self-pay | Admitting: Internal Medicine

## 2021-04-14 ENCOUNTER — Other Ambulatory Visit: Payer: Self-pay

## 2021-04-14 VITALS — BP 120/90 | HR 89 | Ht 59.0 in | Wt 183.6 lb

## 2021-04-14 DIAGNOSIS — M791 Myalgia, unspecified site: Secondary | ICD-10-CM | POA: Diagnosis not present

## 2021-04-14 DIAGNOSIS — E782 Mixed hyperlipidemia: Secondary | ICD-10-CM

## 2021-04-14 DIAGNOSIS — T466X5A Adverse effect of antihyperlipidemic and antiarteriosclerotic drugs, initial encounter: Secondary | ICD-10-CM | POA: Diagnosis not present

## 2021-04-14 MED ORDER — ROSUVASTATIN CALCIUM 10 MG PO TABS: 10.0000 mg | ORAL_TABLET | Freq: Every day | ORAL | 3 refills | Status: DC

## 2021-04-14 NOTE — Patient Instructions (Signed)

## 2021-04-14 NOTE — Progress Notes (Signed)
LIPID CLINIC CONSULT NOTE  Chief Complaint:  Manage dyslipidemia  Primary Care Physician: Vevelyn Francois, NP  Primary Cardiologist:  Elouise Munroe, MD  HPI:  Jessica Lawson is a 55 y.o. female who is being seen today for the evaluation of dyslipidemia at the request of Vevelyn Francois, NP.  This is a pleasant 55 year old female followed by Dr. Ainsley Spinner and recently seen by Dr. Harrell Gave.  She has a history of atypical chest pain and dyslipidemia with statin intolerance.  She seemed to have cramping in her legs which may have been related to higher dose fenofibrate but after reducing the dose her symptoms improved.  She is tolerating moderate dose rosuvastatin 10 mg daily.  Repeat lipids have shown marked improvement in her numbers.  She is also made significant dietary changes.  Previously her total cholesterol is 335, now down to 190.  Triglycerides have come down from 258-227.  HDL is 36 and LDL is 109.  She does have borderline diabetes on Metformin but no significant family history of early onset heart disease.  She has a history of hypertension which is well controlled today at 118/77.  07/02/2020  Jessica Lawson returns today for follow-up.  Unfortunately she was not tolerant of increasing the rosuvastatin from 10 to 20 mg.  She recently saw Dr. Margaretann Loveless who decreased her rosuvastatin back to 10 mg daily.  She is also on low-dose fenofibrate.  Her most recent lipid showed total cholesterol of 219, triglycerides 288, HDL 37 and LDL 130 (up from 118 when she was on a higher dose).  Difficult to quantify her cardiovascular risk although risk factors include obesity and borderline diabetes as well as prehypertension.  04/14/2021  Jessica Lawson is seen today in follow-up.  Overall she continues to do well.  She has had good lipid control with the addition of ezetimibe.  Her cholesterol now is at target with LDL of 57, HDL 34, triglycerides slightly elevated at 180 (down from 280) and total  cholesterol 121.  A1c has crept up a little to 7.9% and may be partially responsible for her elevated triglycerides.  PMHx:  Past Medical History:  Diagnosis Date   Allergy    Back pain    Diabetes mellitus without complication (HCC)    High blood cholesterol    Hypertension    Kidney stone    Normal cardiac stress test 02/2016   low risk   Obesity    Pain of cheek    and right side neck   Tobacco use    UTI (lower urinary tract infection)    Wrist pain    left wrist    Past Surgical History:  Procedure Laterality Date   CESAREAN SECTION     3 c-sections   DENTAL SURGERY     teeth removed with meds to calm     FAMHx:  Family History  Problem Relation Age of Onset   Hyperlipidemia Other    Hypertension Other    Diabetes Other    Diabetes Mother    Lung disease Father    Diabetes Sister    Diabetes Sister    Hypertension Sister    Colon cancer Neg Hx    Colon polyps Neg Hx    Esophageal cancer Neg Hx    Stomach cancer Neg Hx    Rectal cancer Neg Hx     SOCHx:   reports that she quit smoking about 4 years ago. Her smoking use included cigarettes. She smoked  an average of 1 pack per day. She has never used smokeless tobacco. She reports that she does not drink alcohol and does not use drugs.  ALLERGIES:  Allergies  Allergen Reactions   Asa [Aspirin] Anaphylaxis, Hives and Swelling   Iohexol Other (See Comments)     Desc: pt complains of difficulty swallowing and thickened tongue/ throat closes up    Penicillins Hives, Swelling and Other (See Comments)    Thrush    Sulfa Antibiotics Anaphylaxis, Hives, Swelling and Other (See Comments)   Nystatin Other (See Comments)    thrush    ROS: Pertinent items noted in HPI and remainder of comprehensive ROS otherwise negative.  HOME MEDS: Current Outpatient Medications on File Prior to Visit  Medication Sig Dispense Refill   ACCU-CHEK GUIDE test strip USE AS DIRECTED FOUR TIMES DAILY. 100 strip 3   Accu-Chek  Softclix Lancets lancets USE AS DIRECTED FOUR TIMES DAILY. 100 each 3   ANEFRIN NASAL SPRAY 0.05 % nasal spray Place into both nostrils.     ascorbic acid (VITAMIN C) 1000 MG tablet Take 2,000 mg by mouth daily.     blood glucose meter kit and supplies KIT Dispense based on patient and insurance preference. Use up to four times daily as directed. (FOR ICD-9 250.00, 250.01). 1 each 0   cetirizine (ZYRTEC) 10 MG tablet TAKE 1 TABLET(10 MG) BY MOUTH DAILY 90 tablet 3   Cholecalciferol (VITAMIN D3) 50 MCG (2000 UT) TABS Take 1 tablet by mouth daily.     diclofenac Sodium (VOLTAREN) 1 % GEL APPLY TO LEFT SIDED FOOT TWICE DAILY FOR 5 DAYS AS NEEDED     ezetimibe (ZETIA) 10 MG tablet TAKE 1 TABLET(10 MG) BY MOUTH DAILY 90 tablet 3   fenofibrate (TRICOR) 48 MG tablet TAKE 1 TABLET(48 MG) BY MOUTH DAILY 90 tablet 3   fluticasone (FLONASE) 50 MCG/ACT nasal spray Place 2 sprays into both nostrils daily. 16 g 6   hydrochlorothiazide (HYDRODIURIL) 25 MG tablet Take 1 tablet (25 mg total) by mouth daily. 90 tablet 3   loratadine (CLARITIN) 10 MG tablet Take 1 tablet (10 mg total) by mouth daily. 30 tablet 11   metFORMIN (GLUCOPHAGE) 500 MG tablet Take 1 tablet (500 mg total) by mouth 2 (two) times daily with a meal. 180 tablet 3   methocarbamol (ROBAXIN) 500 MG tablet Take 1 tablet (500 mg total) by mouth 2 (two) times daily as needed for muscle spasms. 30 tablet 2   Multiple Vitamins-Minerals (MULTIVITAMIN WITH MINERALS) tablet Take 1 tablet by mouth daily.     omega-3 acid ethyl esters (LOVAZA) 1 g capsule TAKE 1 CAPSULE BY MOUTH EVERY DAY 90 capsule 1   ondansetron (ZOFRAN) 4 MG tablet Take 1 tablet (4 mg total) by mouth every 8 (eight) hours as needed for nausea or vomiting. 30 tablet 1   PROAIR HFA 108 (90 Base) MCG/ACT inhaler INHALE 2 PUFFS INTO THE LUNGS EVERY 4 HOURS AS NEEDED FOR WHEEZING OR SHORTNESS OF BREATH 8.5 g 3   rosuvastatin (CRESTOR) 10 MG tablet Take 1 tablet (10 mg total) by mouth 3 (three)  times a week. 30 tablet 2   valACYclovir (VALTREX) 1000 MG tablet Take 1 tablet (1,000 mg total) by mouth 2 (two) times daily. 20 tablet 0   Zinc 50 MG TABS Take 1 tablet by mouth daily.     No current facility-administered medications on file prior to visit.    LABS/IMAGING: No results found for this or any previous  visit (from the past 48 hour(s)). No results found.  LIPID PANEL:    Component Value Date/Time   CHOL 121 03/10/2021 1151   TRIG 180 (H) 03/10/2021 1151   HDL 34 (L) 03/10/2021 1151   CHOLHDL 3.6 03/10/2021 1151   CHOLHDL 7.3 (H) 07/12/2017 0950   VLDL 50 (H) 04/06/2017 0902   LDLCALC 57 03/10/2021 1151   LDLCALC 183 (H) 07/12/2017 0950    WEIGHTS: Wt Readings from Last 3 Encounters:  04/14/21 183 lb 9.6 oz (83.3 kg)  03/10/21 182 lb 0.8 oz (82.6 kg)  10/26/20 187 lb (84.8 kg)    VITALS: BP 120/90   Pulse 89   Ht $R'4\' 11"'jv$  (1.499 m)   Wt 183 lb 9.6 oz (83.3 kg)   LMP 08/22/2011 Comment: patient states LMP 20 years ago  SpO2 97%   BMI 37.08 kg/m   EXAM: Deferred  EKG: Deferred  ASSESSMENT: Mixed dyslipidemia, goal LDL <100 Intermediate 10 year risk by pooled cohort Moderate obesity Hypertension - at goal DM2- on metformin, A1c 6.2  PLAN: 1.   Ms. Rudden has achieved target cholesterol on a combination of ezetimibe and low-dose rosuvastatin as well as fenofibrate and omega-3's.  Would recommend continuing this combination of medications.  I have given her prescription for the rosuvastatin and ezetimibe.  Hopefully this could be prescribed by her cardiologist or primary care provider in the future.  I think she can follow-up in the lipid clinic as needed.  Thanks again for the opportunity to participate in her care.  Pixie Casino, MD, [facility], [place] Director of the Advanced Lipid Disorders &  Cardiovascular Risk Reduction Clinic Diplomate of the American Board of Clinical Lipidology Attending Cardiologist   Direct Dial: [phone]  Fax: [phone]  Website:  [email or web address] 04/14/2021, 3:04 PM

## 2021-05-06 ENCOUNTER — Encounter: Payer: Self-pay | Admitting: Internal Medicine

## 2021-05-06 ENCOUNTER — Other Ambulatory Visit: Payer: Self-pay

## 2021-05-06 ENCOUNTER — Ambulatory Visit (INDEPENDENT_AMBULATORY_CARE_PROVIDER_SITE_OTHER): Payer: 59 | Admitting: Internal Medicine

## 2021-05-06 VITALS — BP 138/68 | HR 68 | Ht 59.0 in | Wt 183.0 lb

## 2021-05-06 DIAGNOSIS — Z789 Other specified health status: Secondary | ICD-10-CM

## 2021-05-06 DIAGNOSIS — T466X5A Adverse effect of antihyperlipidemic and antiarteriosclerotic drugs, initial encounter: Secondary | ICD-10-CM

## 2021-05-06 DIAGNOSIS — I10 Essential (primary) hypertension: Secondary | ICD-10-CM

## 2021-05-06 DIAGNOSIS — T466X5D Adverse effect of antihyperlipidemic and antiarteriosclerotic drugs, subsequent encounter: Secondary | ICD-10-CM

## 2021-05-06 DIAGNOSIS — M791 Myalgia, unspecified site: Secondary | ICD-10-CM | POA: Diagnosis not present

## 2021-05-06 DIAGNOSIS — R072 Precordial pain: Secondary | ICD-10-CM | POA: Diagnosis not present

## 2021-05-06 DIAGNOSIS — E669 Obesity, unspecified: Secondary | ICD-10-CM

## 2021-05-06 DIAGNOSIS — E782 Mixed hyperlipidemia: Secondary | ICD-10-CM

## 2021-05-06 DIAGNOSIS — Z6836 Body mass index (BMI) 36.0-36.9, adult: Secondary | ICD-10-CM

## 2021-05-06 DIAGNOSIS — Z7189 Other specified counseling: Secondary | ICD-10-CM | POA: Diagnosis not present

## 2021-05-06 NOTE — Progress Notes (Signed)
Cardiology Office Note:    Date:  05/06/2021   ID:  Jessica Lawson, DOB 27-Aug-1965, MRN [id number]  PCP:  Vevelyn Francois, NP  Cardiologist:  Elouise Munroe, MD  Electrophysiologist:  None   Referring MD: Vevelyn Francois, NP   Chief Complaint/Reason for Referral: Chest pain  History of Present Illness:    Jessica Lawson is a 55 y.o. female with a history of DM 2 on Metformin, hyperlipidemia/hypertriglyceridemia, hypertension.  Feeling well overall with no chest pain or shortness of breath. She did not complete the stress test due to insurance issues, and feels symptoms are attributable to gas. Feels well on current lipid therapy. Triglycerides remain elevated, A1C 7.9%, we discussed strategies for lipid and A1C lowering. Discussed healthy weight and wellness center referal and possible use of ozempic vs farxiga/jardiance, can be coordinated by [facility].  The patient denies chest pain, chest pressure, dyspnea at rest or with exertion, palpitations, PND, orthopnea, or leg swelling. Denies cough, fever, chills. Denies nausea, vomiting. Denies syncope or presyncope. Denies dizziness or lightheadedness.    Past Medical History:  Diagnosis Date   Allergy    Back pain    Diabetes mellitus without complication (HCC)    High blood cholesterol    Hypertension    Kidney stone    Normal cardiac stress test 02/2016   low risk   Obesity    Pain of cheek    and right side neck   Tobacco use    UTI (lower urinary tract infection)    Wrist pain    left wrist    Past Surgical History:  Procedure Laterality Date   CESAREAN SECTION     3 c-sections   DENTAL SURGERY     teeth removed with meds to calm     Current Medications: Current Meds  Medication Sig   ACCU-CHEK GUIDE test strip USE AS DIRECTED FOUR TIMES DAILY.   Accu-Chek Softclix Lancets lancets USE AS DIRECTED FOUR TIMES DAILY.   ANEFRIN NASAL SPRAY 0.05 % nasal spray Place into both nostrils.   ascorbic  acid (VITAMIN C) 1000 MG tablet Take 2,000 mg by mouth daily.   blood glucose meter kit and supplies KIT Dispense based on patient and insurance preference. Use up to four times daily as directed. (FOR ICD-9 250.00, 250.01).   cetirizine (ZYRTEC) 10 MG tablet TAKE 1 TABLET(10 MG) BY MOUTH DAILY   Cholecalciferol (VITAMIN D3) 50 MCG (2000 UT) TABS Take 1 tablet by mouth daily.   diclofenac Sodium (VOLTAREN) 1 % GEL APPLY TO LEFT SIDED FOOT TWICE DAILY FOR 5 DAYS AS NEEDED   ezetimibe (ZETIA) 10 MG tablet TAKE 1 TABLET(10 MG) BY MOUTH DAILY   fenofibrate (TRICOR) 48 MG tablet TAKE 1 TABLET(48 MG) BY MOUTH DAILY   fluticasone (FLONASE) 50 MCG/ACT nasal spray Place 2 sprays into both nostrils daily.   hydrochlorothiazide (HYDRODIURIL) 25 MG tablet Take 1 tablet (25 mg total) by mouth daily.   loratadine (CLARITIN) 10 MG tablet Take 1 tablet (10 mg total) by mouth daily.   metFORMIN (GLUCOPHAGE) 500 MG tablet Take 1 tablet (500 mg total) by mouth 2 (two) times daily with a meal.   methocarbamol (ROBAXIN) 500 MG tablet Take 1 tablet (500 mg total) by mouth 2 (two) times daily as needed for muscle spasms.   Multiple Vitamins-Minerals (MULTIVITAMIN WITH MINERALS) tablet Take 1 tablet by mouth daily.   omega-3 acid ethyl esters (LOVAZA) 1 g capsule TAKE 1 CAPSULE BY MOUTH EVERY  DAY   ondansetron (ZOFRAN) 4 MG tablet Take 1 tablet (4 mg total) by mouth every 8 (eight) hours as needed for nausea or vomiting.   PROAIR HFA 108 (90 Base) MCG/ACT inhaler INHALE 2 PUFFS INTO THE LUNGS EVERY 4 HOURS AS NEEDED FOR WHEEZING OR SHORTNESS OF BREATH   rosuvastatin (CRESTOR) 10 MG tablet Take 1 tablet (10 mg total) by mouth daily.   valACYclovir (VALTREX) 1000 MG tablet Take 1 tablet (1,000 mg total) by mouth 2 (two) times daily.   Zinc 50 MG TABS Take 1 tablet by mouth daily.     Allergies:   Asa [aspirin], Iohexol, Penicillins, Sulfa antibiotics, and Nystatin   Social History   Tobacco Use   Smoking status:  Former    Packs/day: 1.00    Types: Cigarettes    Quit date: 07/26/2016    Years since quitting: 4.7   Smokeless tobacco: Never  Vaping Use   Vaping Use: Never used  Substance Use Topics   Alcohol use: No   Drug use: No     Family History: The patient's family history includes Diabetes in her mother, sister, sister, and another family member; Hyperlipidemia in an other family member; Hypertension in her sister and another family member; Lung disease in her father. There is no history of Colon cancer, Colon polyps, Esophageal cancer, Stomach cancer, or Rectal cancer.  ROS:   Please see the history of present illness.    All other systems reviewed and are negative.  EKGs/Labs/Other Studies Reviewed:    The following studies were reviewed today:  EKG:  NSR, Nonspecific T wave abnl  Last visit: NSR, sinus arrhythmia, nonspecific t wave abnl.   Recent Labs: 03/10/2021: BUN 14; Creatinine, Ser 0.78; Hemoglobin 14.1; Platelets 340; Potassium 3.9; Sodium 139; TSH 0.706  Recent Lipid Panel    Component Value Date/Time   CHOL 121 03/10/2021 1151   TRIG 180 (H) 03/10/2021 1151   HDL 34 (L) 03/10/2021 1151   CHOLHDL 3.6 03/10/2021 1151   CHOLHDL 7.3 (H) 07/12/2017 0950   VLDL 50 (H) 04/06/2017 0902   LDLCALC 57 03/10/2021 1151   LDLCALC 183 (H) 07/12/2017 0950    Physical Exam:    VS:  BP 138/68   Pulse 68   Ht _0  (1.499 m)   Wt 183 lb (83 kg)   LMP 08/22/2011 Comment: patient states LMP 20 years ago  SpO2 99%   BMI 36.96 kg/m     Wt Readings from Last 5 Encounters:  05/06/21 183 lb (83 kg)  04/14/21 183 lb 9.6 oz (83.3 kg)  03/10/21 182 lb 0.8 oz (82.6 kg)  10/26/20 187 lb (84.8 kg)  10/16/20 183 lb 9.6 oz (83.3 kg)    Constitutional: No acute distress Eyes: sclera non-icteric, normal conjunctiva and lids ENMT: normal dentition, moist mucous membranes Cardiovascular: regular rhythm, normal rate, no murmurs. S1 and S2 normal. Radial pulses normal bilaterally. No  jugular venous distention.  Respiratory: clear to auscultation bilaterally GI : normal bowel sounds, soft and nontender. No distention.   MSK: extremities warm, well perfused. No edema.  NEURO: grossly nonfocal exam, moves all extremities. PSYCH: alert and oriented x 3, normal mood and affect.   ASSESSMENT:    1. Essential hypertension   2. Body mass index (BMI) of 36.0 to 36.9   3. Hypercholesterolemia with hypertriglyceridemia   4. Myalgia due to statin   5. Precordial pain   6. Obesity (BMI 30-39.9)   7. Statin intolerance   8.  Cardiac risk counseling    PLAN:    Precordial pain - no recurrence. Thinks GI related. Follow and stress test if symptoms.  Myalgia due to statin Statin intolerance Hypercholesterolemia with hypertriglyceridemia - follows with Dr. Hilty/lipid clinic.  - continue crestor 10 mg daily - continue zetia 10 mg daily - continue fenofibrate 48 mg daily - continue lovaza 1 g daily.  - will refer to healthy weight and wellness center for consideration of further diabetes therapy which in turn will likely help with hypertriglyceridemia.  Essential hypertension - stable BP on HCTZ 25 mg daily  Total time of encounter: 30 minutes total time of encounter, including 20 minutes spent in face-to-face patient care on the date of this encounter. This time includes coordination of care and counseling regarding above mentioned problem list. Remainder of non-face-to-face time involved reviewing chart documents/testing relevant to the patient encounter and documentation in the medical record. I have independently reviewed documentation from referring provider.   Cherlynn Kaiser, MD, [facility]     Medication Adjustments/Labs and Tests Ordered: Current medicines are reviewed at length with the patient today.  Concerns regarding medicines are outlined above.   Orders Placed This Encounter  Procedures   Amb Ref to [facility]    EKG 12-Lead    Shared Decision Making/Informed Consent:     No orders of the defined types were placed in this encounter.   Patient Instructions  Medication Instructions:  No Changes In Medications at this time.  *If you need a refill on your cardiac medications before your next appointment, please call your pharmacy*  Follow-Up: At [facility], you and your health needs are our priority.  As part of our continuing mission to provide you with exceptional heart care, we have created designated Provider Care Teams.  These Care Teams include your primary Cardiologist (physician) and Advanced Practice Providers (APPs -  Physician Assistants and Nurse Practitioners) who all work together to provide you with the care you need, when you need it.  Your next appointment:   MARCH 30th at 1:40PM  The format for your next appointment:   In Person  Provider:   Cherlynn Kaiser, MD  Other Instructions AMBULATORY REFERRAL TO [place]

## 2021-05-06 NOTE — Patient Instructions (Signed)
Medication Instructions:  No Changes In Medications at this time.  *If you need a refill on your cardiac medications before your next appointment, please call your pharmacy*  Follow-Up: At [facility], you and your health needs are our priority.  As part of our continuing mission to provide you with exceptional heart care, we have created designated Provider Care Teams.  These Care Teams include your primary Cardiologist (physician) and Advanced Practice Providers (APPs -  Physician Assistants and Nurse Practitioners) who all work together to provide you with the care you need, when you need it.  Your next appointment:   MARCH 30th at 1:40PM  The format for your next appointment:   In Person  Provider:   Cherlynn Kaiser, MD  Other Instructions AMBULATORY REFERRAL TO [place]

## 2021-05-26 ENCOUNTER — Telehealth: Payer: Self-pay

## 2021-05-26 NOTE — Telephone Encounter (Signed)
Pt called in and said if she can get something different for her HAY FEVER med?

## 2021-05-27 ENCOUNTER — Telehealth: Payer: Self-pay

## 2021-05-27 NOTE — Telephone Encounter (Addendum)
Pt is in need for sinus infection med's due to recent med is NOT working!  Also needs to know if she is due for flu shot!  Also pt stated that some of her med's need to be refilled ad pharmacy sent the request!

## 2021-05-27 NOTE — Telephone Encounter (Incomplete Revision)
Pt is in need for sinus infection med's due to recent med is NOT working!  Also needs to know if she is due for flu shot!  Also pt stated that some of her med's need to be refilled ad pharmacy sent the request!

## 2021-05-31 ENCOUNTER — Other Ambulatory Visit: Payer: Self-pay | Admitting: Nurse Practitioner

## 2021-05-31 MED ORDER — MONTELUKAST SODIUM 10 MG PO TABS: 10.0000 mg | ORAL_TABLET | Freq: Every day | ORAL | 3 refills | Status: DC

## 2021-05-31 NOTE — Telephone Encounter (Signed)
Please send in Singulair.

## 2021-06-22 ENCOUNTER — Other Ambulatory Visit: Payer: Self-pay

## 2021-06-22 DIAGNOSIS — E119 Type 2 diabetes mellitus without complications: Secondary | ICD-10-CM

## 2021-06-22 MED ORDER — ACCU-CHEK GUIDE VI STRP: ORAL_STRIP | 3 refills | Status: DC

## 2021-09-10 ENCOUNTER — Other Ambulatory Visit: Payer: Self-pay

## 2021-09-10 ENCOUNTER — Encounter: Payer: Self-pay | Admitting: Nurse Practitioner

## 2021-09-10 ENCOUNTER — Ambulatory Visit (INDEPENDENT_AMBULATORY_CARE_PROVIDER_SITE_OTHER): Payer: Medicaid Other | Admitting: Nurse Practitioner

## 2021-09-10 VITALS — BP 141/88 | HR 89 | Resp 16

## 2021-09-10 DIAGNOSIS — E119 Type 2 diabetes mellitus without complications: Secondary | ICD-10-CM | POA: Diagnosis not present

## 2021-09-10 DIAGNOSIS — I10 Essential (primary) hypertension: Secondary | ICD-10-CM

## 2021-09-10 DIAGNOSIS — E782 Mixed hyperlipidemia: Secondary | ICD-10-CM

## 2021-09-10 LAB — POCT URINALYSIS DIP (CLINITEK)
Bilirubin, UA: NEGATIVE
Blood, UA: NEGATIVE
Glucose, UA: NEGATIVE mg/dL
Ketones, POC UA: NEGATIVE mg/dL
Nitrite, UA: NEGATIVE
POC PROTEIN,UA: NEGATIVE
Spec Grav, UA: >=1.03 — AB (ref 1.010–1.025)
Urobilinogen, UA: 0.2 E.U./dL
pH, UA: 5.5 (ref 5.0–8.0)

## 2021-09-10 LAB — GLUCOSE, POCT (MANUAL RESULT ENTRY): POC Glucose: 125 mg/dl — AB (ref 70–99)

## 2021-09-10 LAB — POCT GLYCOSYLATED HEMOGLOBIN (HGB A1C): HbA1c, POC (controlled diabetic range): 8.5 % — AB (ref 0.0–7.0)

## 2021-09-10 MED ORDER — METFORMIN HCL 500 MG PO TABS: 500.0000 mg | ORAL_TABLET | Freq: Two times a day (BID) | ORAL | 3 refills | Status: DC

## 2021-09-10 NOTE — Progress Notes (Signed)
183.0 °

## 2021-09-10 NOTE — Patient Instructions (Signed)

## 2021-09-10 NOTE — Progress Notes (Signed)
Established Patient Office Visit  Subjective:  Patient ID: Jessica Lawson, female    DOB: [birth year]  Age: 56 y.o. MRN: [id number]  CC:  Chief Complaint  Patient presents with   Diabetes    HPI Jessica Lawson presents for follow up. She  has a past medical history of Allergy, Back pain, Diabetes mellitus without complication ([place]), High blood cholesterol, Hypertension, Kidney stone, Normal cardiac stress test (02/2016), Obesity, Pain of cheek, Tobacco use, UTI (lower urinary tract infection), and Wrist pain.   She is in today for follow up. She reports being compliant with her medication for HTN and DM. She reports that her BP has been in range. She is following up with cardiology and her rosuvastatin has been adjusted to QOD.   She is prescribed metformin 500 mg Bid. Her CBG ranges in the 140 's. She Denies fever, chills, headache, dizziness, visual changes, polydipsia,  polyphagia shortness of breath, dyspnea on exertion, chest pain, nausea, vomiting, polyuria, constipation, diarrhea, or any edema.   Past Medical History:  Diagnosis Date   Allergy    Back pain    Diabetes mellitus without complication (HCC)    High blood cholesterol    Hypertension    Kidney stone    Normal cardiac stress test 02/2016   low risk   Obesity    Pain of cheek    and right side neck   Tobacco use    UTI (lower urinary tract infection)    Wrist pain    left wrist    Past Surgical History:  Procedure Laterality Date   CESAREAN SECTION     3 c-sections   DENTAL SURGERY     teeth removed with meds to calm     Family History  Problem Relation Age of Onset   Hyperlipidemia Other    Hypertension Other    Diabetes Other    Diabetes Mother    Lung disease Father    Diabetes Sister    Diabetes Sister    Hypertension Sister    Colon cancer Neg Hx    Colon polyps Neg Hx    Esophageal cancer Neg Hx    Stomach cancer Neg Hx    Rectal cancer Neg Hx     Social History   Socioeconomic  History   Marital status: Single    Spouse name: Not on file   Number of children: Not on file   Years of education: Not on file   Highest education level: Not on file  Occupational History   Not on file  Tobacco Use   Smoking status: Former    Packs/day: 1.00    Types: Cigarettes    Quit date: 07/26/2016    Years since quitting: 5.1   Smokeless tobacco: Never  Vaping Use   Vaping Use: Never used  Substance and Sexual Activity   Alcohol use: No   Drug use: No   Sexual activity: Not Currently  Other Topics Concern   Not on file  Social History Narrative   Not on file   Social Determinants of Health   Financial Resource Strain: Not on file  Food Insecurity: Not on file  Transportation Needs: Not on file  Physical Activity: Not on file  Stress: Not on file  Social Connections: Not on file  Intimate Partner Violence: Not on file    Outpatient Medications Prior to Visit  Medication Sig Dispense Refill   Accu-Chek Softclix Lancets lancets USE AS DIRECTED FOUR TIMES DAILY. 100 each  3   ANEFRIN NASAL SPRAY 0.05 % nasal spray Place into both nostrils.     ascorbic acid (VITAMIN C) 1000 MG tablet Take 2,000 mg by mouth daily.     blood glucose meter kit and supplies KIT Dispense based on patient and insurance preference. Use up to four times daily as directed. (FOR ICD-9 250.00, 250.01). 1 each 0   cetirizine (ZYRTEC) 10 MG tablet TAKE 1 TABLET(10 MG) BY MOUTH DAILY 90 tablet 3   Cholecalciferol (VITAMIN D3) 50 MCG (2000 UT) TABS Take 1 tablet by mouth daily.     diclofenac Sodium (VOLTAREN) 1 % GEL APPLY TO LEFT SIDED FOOT TWICE DAILY FOR 5 DAYS AS NEEDED     ezetimibe (ZETIA) 10 MG tablet TAKE 1 TABLET(10 MG) BY MOUTH DAILY 90 tablet 3   fenofibrate (TRICOR) 48 MG tablet TAKE 1 TABLET(48 MG) BY MOUTH DAILY 90 tablet 3   fluticasone (FLONASE) 50 MCG/ACT nasal spray Place 2 sprays into both nostrils daily. 16 g 6   glucose blood (ACCU-CHEK GUIDE) test strip USE AS DIRECTED FOUR  TIMES DAILY. 100 strip 3   loratadine (CLARITIN) 10 MG tablet Take 1 tablet (10 mg total) by mouth daily. 30 tablet 11   methocarbamol (ROBAXIN) 500 MG tablet Take 1 tablet (500 mg total) by mouth 2 (two) times daily as needed for muscle spasms. 30 tablet 2   montelukast (SINGULAIR) 10 MG tablet Take 1 tablet (10 mg total) by mouth daily. 90 tablet 3   Multiple Vitamins-Minerals (MULTIVITAMIN WITH MINERALS) tablet Take 1 tablet by mouth daily.     omega-3 acid ethyl esters (LOVAZA) 1 g capsule TAKE 1 CAPSULE BY MOUTH EVERY DAY 90 capsule 1   ondansetron (ZOFRAN) 4 MG tablet Take 1 tablet (4 mg total) by mouth every 8 (eight) hours as needed for nausea or vomiting. 30 tablet 1   PROAIR HFA 108 (90 Base) MCG/ACT inhaler INHALE 2 PUFFS INTO THE LUNGS EVERY 4 HOURS AS NEEDED FOR WHEEZING OR SHORTNESS OF BREATH 8.5 g 3   rosuvastatin (CRESTOR) 10 MG tablet Take 1 tablet (10 mg total) by mouth daily. 90 tablet 3   valACYclovir (VALTREX) 1000 MG tablet Take 1 tablet (1,000 mg total) by mouth 2 (two) times daily. 20 tablet 0   Zinc 50 MG TABS Take 1 tablet by mouth daily.     hydrochlorothiazide (HYDRODIURIL) 25 MG tablet Take 1 tablet (25 mg total) by mouth daily. 90 tablet 3   metFORMIN (GLUCOPHAGE) 500 MG tablet Take 1 tablet (500 mg total) by mouth 2 (two) times daily with a meal. 180 tablet 3   No facility-administered medications prior to visit.    Allergies  Allergen Reactions   Asa [Aspirin] Anaphylaxis, Hives and Swelling   Iohexol Other (See Comments)     Desc: pt complains of difficulty swallowing and thickened tongue/ throat closes up    Penicillins Hives, Swelling and Other (See Comments)    Thrush    Sulfa Antibiotics Anaphylaxis, Hives, Swelling and Other (See Comments)   Nystatin Other (See Comments)    thrush    ROS Review of Systems    Objective:    Physical Exam Constitutional:      Appearance: She is obese.  HENT:     Head: Normocephalic and atraumatic.     Nose:  Nose normal.     Mouth/Throat:     Mouth: Mucous membranes are moist.  Cardiovascular:     Rate and Rhythm: Normal rate and regular  rhythm.     Pulses: Normal pulses.     Heart sounds: Normal heart sounds.  Pulmonary:     Effort: Pulmonary effort is normal.     Comments: Diminished breath sounds Abdominal:     General: Bowel sounds are normal.     Palpations: Abdomen is soft.     Comments: Increased abdominal girth   Musculoskeletal:        General: Normal range of motion.     Cervical back: Normal range of motion.  Feet:     Right foot:     Protective Sensation: 10 sites tested.  10 sites sensed.     Left foot:     Protective Sensation: 10 sites tested.  10 sites sensed.  Skin:    General: Skin is warm and dry.     Capillary Refill: Capillary refill takes less than 2 seconds.  Neurological:     General: No focal deficit present.     Mental Status: She is alert and oriented to person, place, and time.  Psychiatric:        Mood and Affect: Mood normal.        Behavior: Behavior normal.        Thought Content: Thought content normal.        Judgment: Judgment normal.    BP (!) 141/88    Pulse 89    Resp 16    LMP 08/22/2011 Comment: patient states LMP 20 years ago   SpO2 96%  Wt Readings from Last 3 Encounters:  05/06/21 183 lb (83 kg)  04/14/21 183 lb 9.6 oz (83.3 kg)  03/10/21 182 lb 0.8 oz (82.6 kg)     Health Maintenance Due  Topic Date Due   FOOT EXAM  03/16/2021   URINE MICROALBUMIN  10/16/2021    There are no preventive care reminders to display for this patient.  Lab Results  Component Value Date   TSH 0.706 03/10/2021   Lab Results  Component Value Date   WBC 6.6 03/10/2021   HGB 14.1 03/10/2021   HCT 42.0 03/10/2021   MCV 84 03/10/2021   PLT 340 03/10/2021   Lab Results  Component Value Date   NA 139 03/10/2021   K 3.9 03/10/2021   CO2 25 06/04/2019   GLUCOSE 107 (H) 03/10/2021   BUN 14 03/10/2021   CREATININE 0.78 03/10/2021   BILITOT  0.3 03/10/2021   ALKPHOS 52 03/10/2021   AST 36 03/10/2021   ALT 32 06/04/2019   PROT 7.5 03/10/2021   ALBUMIN 4.8 03/10/2021   CALCIUM 10.0 03/10/2021   ANIONGAP 11 05/18/2018   EGFR 90 03/10/2021   Lab Results  Component Value Date   CHOL 121 03/10/2021   Lab Results  Component Value Date   HDL 34 (L) 03/10/2021   Lab Results  Component Value Date   LDLCALC 57 03/10/2021   Lab Results  Component Value Date   TRIG 180 (H) 03/10/2021   Lab Results  Component Value Date   CHOLHDL 3.6 03/10/2021   Lab Results  Component Value Date   HGBA1C 8.5 (A) 09/10/2021      Assessment & Plan:   Problem List Items Addressed This Visit       Cardiovascular and Mediastinum   Essential hypertension Stable  Encouraged on going compliance with current medication regimen Encouraged home monitoring and recording BP <130/80 Eating a heart-healthy diet with less salt Encouraged regular physical activity  Recommend Weight loss  Other   Hyperlipidemia Stable  Labs pending  Continue with current regimen.  No changes warranted.    Relevant Orders   Lipid panel   Other Visit Diagnoses     Type 2 diabetes mellitus without complication, without long-term current use of insulin (HCC)    -  Primary Persistent A1c increased to 8.5% Encourage compliance with current treatment regimen  Dose adjustment may consider increasing metformin 1000 mg BID  Encourage regular CBG monitoring Encourage contacting office if excessive hyperglycemia and or hypoglycemia Lifestyle modification with healthy diet (fewer calories, more high fiber foods, whole grains and non-starchy vegetables, lower fat meat and fish, low-fat diary include healthy oils) regular exercise (physical activity) and weight loss Opthalmology exam discussed  Nutritional consult recommended Regular dental visits encouraged Home BP monitoring also encouraged goal <130/80    Relevant Medications   metFORMIN (GLUCOPHAGE)  500 MG tablet   Other Relevant Orders   POCT glucose (manual entry) (Completed)   POCT glycosylated hemoglobin (Hb A1C) (Completed)   POCT URINALYSIS DIP (CLINITEK) (Completed)   Comp. Metabolic Panel (12)       Meds ordered this encounter  Medications   metFORMIN (GLUCOPHAGE) 500 MG tablet    Sig: Take 1 tablet (500 mg total) by mouth 2 (two) times daily with a meal.    Dispense:  180 tablet    Refill:  3    Order Specific Question:   Supervising Provider    AnswerTresa Garter [id number]    Follow-up: No follow-ups on file.    Vevelyn Francois, NP

## 2021-09-11 LAB — COMP. METABOLIC PANEL (12)
AST: 37 IU/L (ref 0–40)
Albumin/Globulin Ratio: 1.8 (ref 1.2–2.2)
Albumin: 5.2 g/dL — ABNORMAL HIGH (ref 3.8–4.9)
Alkaline Phosphatase: 55 IU/L (ref 44–121)
BUN/Creatinine Ratio: 16 (ref 9–23)
BUN: 14 mg/dL (ref 6–24)
Bilirubin Total: 0.4 mg/dL (ref 0.0–1.2)
Calcium: 10.4 mg/dL — ABNORMAL HIGH (ref 8.7–10.2)
Chloride: 98 mmol/L (ref 96–106)
Creatinine, Ser: 0.88 mg/dL (ref 0.57–1.00)
Globulin, Total: 2.9 g/dL (ref 1.5–4.5)
Glucose: 123 mg/dL — ABNORMAL HIGH (ref 70–99)
Potassium: 4 mmol/L (ref 3.5–5.2)
Sodium: 140 mmol/L (ref 134–144)
Total Protein: 8.1 g/dL (ref 6.0–8.5)
eGFR: 78 mL/min/{1.73_m2} (ref >59)

## 2021-09-11 LAB — LIPID PANEL
Chol/HDL Ratio: 4.6 ratio — ABNORMAL HIGH (ref 0.0–4.4)
Cholesterol, Total: 174 mg/dL (ref 100–199)
HDL: 38 mg/dL — ABNORMAL LOW (ref >39)
LDL Chol Calc (NIH): 102 mg/dL — ABNORMAL HIGH (ref 0–99)
Triglycerides: 194 mg/dL — ABNORMAL HIGH (ref 0–149)
VLDL Cholesterol Cal: 34 mg/dL (ref 5–40)

## 2021-09-13 NOTE — Telephone Encounter (Signed)
Yes please

## 2021-09-17 ENCOUNTER — Other Ambulatory Visit: Payer: Self-pay

## 2021-09-17 ENCOUNTER — Other Ambulatory Visit: Payer: Medicaid Other

## 2021-09-17 DIAGNOSIS — R82998 Other abnormal findings in urine: Secondary | ICD-10-CM

## 2021-09-19 LAB — URINE CULTURE

## 2021-10-13 ENCOUNTER — Other Ambulatory Visit: Payer: Self-pay | Admitting: Nurse Practitioner

## 2021-10-13 MED ORDER — FLUCONAZOLE 150 MG PO TABS: 150.0000 mg | ORAL_TABLET | Freq: Every day | ORAL | 0 refills | Status: DC

## 2021-10-13 NOTE — Progress Notes (Signed)
   [facility] [address] [place], [place]  [address] Phone:  [phone]   Fax:  [phone] 

## 2021-10-26 ENCOUNTER — Other Ambulatory Visit: Payer: Self-pay

## 2021-10-27 ENCOUNTER — Other Ambulatory Visit: Payer: Self-pay | Admitting: Nurse Practitioner

## 2021-10-27 DIAGNOSIS — Z1231 Encounter for screening mammogram for malignant neoplasm of breast: Secondary | ICD-10-CM

## 2021-10-27 MED ORDER — ROSUVASTATIN CALCIUM 10 MG PO TABS: 10.0000 mg | ORAL_TABLET | Freq: Every day | ORAL | 0 refills | Status: DC

## 2021-10-28 ENCOUNTER — Other Ambulatory Visit: Payer: Self-pay

## 2021-10-28 MED ORDER — OMEGA-3-ACID ETHYL ESTERS 1 G PO CAPS: ORAL_CAPSULE | ORAL | 0 refills | Status: DC

## 2021-11-08 DIAGNOSIS — Z1231 Encounter for screening mammogram for malignant neoplasm of breast: Secondary | ICD-10-CM

## 2021-11-11 ENCOUNTER — Ambulatory Visit: Payer: 59 | Admitting: Internal Medicine

## 2021-12-10 ENCOUNTER — Ambulatory Visit
Admission: RE | Admit: 2021-12-10 | Discharge: 2021-12-10 | Disposition: A | Discharge status: Home / Self Care | Payer: Medicaid Other | Source: Ambulatory Visit | Attending: Nurse Practitioner | Admitting: Nurse Practitioner

## 2021-12-10 ENCOUNTER — Ambulatory Visit: Payer: Medicaid Other | Admitting: Nurse Practitioner

## 2021-12-10 DIAGNOSIS — Z1231 Encounter for screening mammogram for malignant neoplasm of breast: Secondary | ICD-10-CM | POA: Diagnosis not present

## 2021-12-15 ENCOUNTER — Other Ambulatory Visit: Payer: Self-pay

## 2021-12-15 DIAGNOSIS — E785 Hyperlipidemia, unspecified: Secondary | ICD-10-CM

## 2021-12-15 MED ORDER — OMEGA-3-ACID ETHYL ESTERS 1 G PO CAPS: ORAL_CAPSULE | ORAL | 2 refills | Status: DC

## 2021-12-16 ENCOUNTER — Encounter: Payer: Self-pay | Admitting: Nurse Practitioner

## 2021-12-16 ENCOUNTER — Ambulatory Visit: Payer: Medicaid Other | Admitting: Nurse Practitioner

## 2021-12-16 VITALS — BP 152/88 | HR 90 | Temp 97.3°F | Ht 59.0 in | Wt 188.0 lb

## 2021-12-16 DIAGNOSIS — E119 Type 2 diabetes mellitus without complications: Secondary | ICD-10-CM

## 2021-12-16 DIAGNOSIS — E785 Hyperlipidemia, unspecified: Secondary | ICD-10-CM

## 2021-12-16 DIAGNOSIS — E669 Obesity, unspecified: Secondary | ICD-10-CM | POA: Diagnosis not present

## 2021-12-16 LAB — POCT GLYCOSYLATED HEMOGLOBIN (HGB A1C)
HbA1c POC (<> result, manual entry): 8.6 % (ref 4.0–5.6)
HbA1c, POC (controlled diabetic range): 8.6 % — AB (ref 0.0–7.0)
HbA1c, POC (prediabetic range): 8.6 % — AB (ref 5.7–6.4)
Hemoglobin A1C: 8.6 % — AB (ref 4.0–5.6)

## 2021-12-16 MED ORDER — WEGOVY 0.25 MG/0.5ML ~~LOC~~ SOAJ: 0.2500 mg | SUBCUTANEOUS | 0 refills | Status: AC

## 2021-12-16 MED ORDER — OMEGA-3-ACID ETHYL ESTERS 1 G PO CAPS: ORAL_CAPSULE | ORAL | 2 refills | Status: DC

## 2021-12-16 MED ORDER — BLOOD GLUCOSE MONITOR KIT: PACK | 0 refills | Status: AC

## 2021-12-16 NOTE — Progress Notes (Signed)
$'@Patient'y$  ID: Jessica Lawson, female    DOB: 1965-11-16, 56 y.o.   MRN: [id number] ? ?Chief Complaint  ?Patient presents with  ? Follow-up  ?  Pt is here for 3 month BP follow up. Pt stated she is concern about here weight she is unable to keep it off.  ? ? ?Referring provider: ?Vevelyn Francois, NP ? ?HPI ? ?Jessica Lawson presents for follow up. She  has a past medical history of Allergy, Back pain, Diabetes mellitus without complication ([place]), High blood cholesterol, Hypertension, Kidney stone, Normal cardiac stress test (02/2016), Obesity, Pain of cheek, Tobacco use, UTI (lower urinary tract infection), and Wrist pain.  ? ? ?SUBJECTIVE: ?56 y.o. female for follow up of diabetes. Diabetic Review of Systems - medication compliance: compliant most of the time, diabetic diet compliance: noncompliant much of the time, home glucose monitoring: is performed regularly.  Other symptoms and concerns: none. Denies f/c/s, n/v/d, hemoptysis, PND, chest pain or edema. ? ? ? ? ? ?Allergies  ?Allergen Reactions  ? Asa [Aspirin] Anaphylaxis, Hives and Swelling  ? Iohexol Other (See Comments)  ?   Desc: pt complains of difficulty swallowing and thickened tongue/ throat closes up ?  ? Penicillins Hives, Swelling and Other (See Comments)  ?  Thrush   ? Sulfa Antibiotics Anaphylaxis, Hives, Swelling and Other (See Comments)  ? Nystatin Other (See Comments)  ?  thrush  ? ? ?Immunization History  ?Administered Date(s) Administered  ? Influenza,inj,Quad PF,6+ Mos 05/22/2020  ? MMRV 06/18/2020  ? PFIZER(Purple Top)SARS-COV-2 Vaccination 02/07/2020, 02/28/2020  ? Pneumococcal Polysaccharide-23 09/03/2015  ? Tdap 11/01/2018  ? ? ?Past Medical History:  ?Diagnosis Date  ? Allergy   ? Back pain   ? Diabetes mellitus without complication ([place])   ? High blood cholesterol   ? Hypertension   ? Kidney stone   ? Normal cardiac stress test 02/2016  ? low risk  ? Obesity   ? Pain of cheek   ? and right side neck  ? Tobacco use   ? UTI (lower urinary  tract infection)   ? Wrist pain   ? left wrist  ? ? ?Tobacco History: ?Social History  ? ?Tobacco Use  ?Smoking Status Former  ? Packs/day: 1.00  ? Types: Cigarettes  ? Quit date: 07/26/2016  ? Years since quitting: 5.3  ?Smokeless Tobacco Never  ? ?Counseling given: Not Answered ? ? ?Outpatient Encounter Medications as of 12/16/2021  ?Medication Sig  ? Accu-Chek Softclix Lancets lancets USE AS DIRECTED FOUR TIMES DAILY.  ? ANEFRIN NASAL SPRAY 0.05 % nasal spray Place into both nostrils.  ? ascorbic acid (VITAMIN C) 1000 MG tablet Take 2,000 mg by mouth daily.  ? cetirizine (ZYRTEC) 10 MG tablet TAKE 1 TABLET(10 MG) BY MOUTH DAILY  ? Cholecalciferol (VITAMIN D3) 50 MCG (2000 UT) TABS Take 1 tablet by mouth daily.  ? ezetimibe (ZETIA) 10 MG tablet TAKE 1 TABLET(10 MG) BY MOUTH DAILY  ? fenofibrate (TRICOR) 48 MG tablet TAKE 1 TABLET(48 MG) BY MOUTH DAILY  ? fluconazole (DIFLUCAN) 150 MG tablet Take 1 tablet (150 mg total) by mouth daily.  ? fluticasone (FLONASE) 50 MCG/ACT nasal spray Place 2 sprays into both nostrils daily.  ? glucose blood (ACCU-CHEK GUIDE) test strip USE AS DIRECTED FOUR TIMES DAILY.  ? hydrochlorothiazide (HYDRODIURIL) 25 MG tablet Take 25 mg by mouth daily.  ? loratadine (CLARITIN) 10 MG tablet Take 1 tablet (10 mg total) by mouth daily.  ? metFORMIN (GLUCOPHAGE) 500 MG  tablet Take 1 tablet (500 mg total) by mouth 2 (two) times daily with a meal.  ? montelukast (SINGULAIR) 10 MG tablet Take 1 tablet (10 mg total) by mouth daily.  ? Multiple Vitamins-Minerals (MULTIVITAMIN WITH MINERALS) tablet Take 1 tablet by mouth daily.  ? ondansetron (ZOFRAN) 4 MG tablet Take 1 tablet (4 mg total) by mouth every 8 (eight) hours as needed for nausea or vomiting.  ? PROAIR HFA 108 (90 Base) MCG/ACT inhaler INHALE 2 PUFFS INTO THE LUNGS EVERY 4 HOURS AS NEEDED FOR WHEEZING OR SHORTNESS OF BREATH  ? rosuvastatin (CRESTOR) 10 MG tablet Take 1 tablet (10 mg total) by mouth daily.  ? Semaglutide-Weight Management  (WEGOVY) 0.25 MG/0.5ML SOAJ Inject 0.25 mg into the skin once a week for 7 days.  ? valACYclovir (VALTREX) 1000 MG tablet Take 1 tablet (1,000 mg total) by mouth 2 (two) times daily.  ? [DISCONTINUED] blood glucose meter kit and supplies KIT Dispense based on patient and insurance preference. Use up to four times daily as directed. (FOR ICD-9 250.00, 250.01).  ? [DISCONTINUED] omega-3 acid ethyl esters (LOVAZA) 1 g capsule TAKE 1 CAPSULE BY MOUTH EVERY DAY  ? blood glucose meter kit and supplies KIT Dispense based on patient and insurance preference. Use up to four times daily as directed. (FOR ICD-9 250.00, 250.01).  ? diclofenac Sodium (VOLTAREN) 1 % GEL APPLY TO LEFT SIDED FOOT TWICE DAILY FOR 5 DAYS AS NEEDED  ? methocarbamol (ROBAXIN) 500 MG tablet Take 1 tablet (500 mg total) by mouth 2 (two) times daily as needed for muscle spasms.  ? omega-3 acid ethyl esters (LOVAZA) 1 g capsule TAKE 1 CAPSULE BY MOUTH EVERY DAY  ? Zinc 50 MG TABS Take 1 tablet by mouth daily.  ? ?No facility-administered encounter medications on file as of 12/16/2021.  ? ? ? ?Review of Systems ? ?Review of Systems  ?Constitutional: Negative.   ?HENT: Negative.    ?Cardiovascular: Negative.   ?Gastrointestinal: Negative.   ?Allergic/Immunologic: Negative.   ?Neurological: Negative.   ?Psychiatric/Behavioral: Negative.     ? ? ? ?Physical Exam ? ?BP (!) 152/88 (BP Location: Right Arm, Patient Position: Sitting, Cuff Size: Normal)   Pulse 90   Temp (!) 97.3 ?F (36.3 ?C)   Ht $R'4\' 11"'Sg$  (1.499 m)   Wt 188 lb (85.3 kg)   LMP 08/22/2011 Comment: patient states LMP 20 years ago  SpO2 90%   BMI 37.97 kg/m?  ? ?Wt Readings from Last 5 Encounters:  ?12/16/21 188 lb (85.3 kg)  ?05/06/21 183 lb (83 kg)  ?04/14/21 183 lb 9.6 oz (83.3 kg)  ?03/10/21 182 lb 0.8 oz (82.6 kg)  ?10/26/20 187 lb (84.8 kg)  ? ? ? ?Physical Exam ?Vitals and nursing note reviewed.  ?Constitutional:   ?   General: She is not in acute distress. ?   Appearance: She is  well-developed.  ?Cardiovascular:  ?   Rate and Rhythm: Normal rate and regular rhythm.  ?Pulmonary:  ?   Effort: Pulmonary effort is normal.  ?   Breath sounds: Normal breath sounds.  ?Neurological:  ?   Mental Status: She is alert and oriented to person, place, and time.  ? ? ? ? ?Assessment & Plan:  ? ?Type 2 diabetes mellitus without complication, without long-term current use of insulin ([place]) ? ?Hyperlipidemia ?- omega-3 acid ethyl esters (LOVAZA) 1 g capsule; TAKE 1 CAPSULE BY MOUTH EVERY DAY  Dispense: 30 capsule; Refill: 2 ? ?2. Obesity (BMI 30-39.9) ? ?-  Semaglutide-Weight Management (WEGOVY) 0.25 MG/0.5ML SOAJ; Inject 0.25 mg into the skin once a week for 7 days.  Dispense: 0.5 mL; Refill: 0 ? ?3. Type 2 diabetes mellitus without complication, without long-term current use of insulin  ? ?Lab Results  ?Component Value Date  ? HGBA1C 8.5 (A) 09/10/2021  ? ? ? ?Follow up: ? ?Follow up in 1 month or sooner if needed ? ? ? ? ?Fenton Foy, NP ?12/17/2021 ? ?

## 2021-12-16 NOTE — Patient Instructions (Addendum)
1. Hyperlipidemia, unspecified hyperlipidemia type ? ?- omega-3 acid ethyl esters (LOVAZA) 1 g capsule; TAKE 1 CAPSULE BY MOUTH EVERY DAY  Dispense: 30 capsule; Refill: 2 ? ?2. Obesity (BMI 30-39.9) ? ?- Semaglutide-Weight Management (WEGOVY) 0.25 MG/0.5ML SOAJ; Inject 0.25 mg into the skin once a week for 7 days.  Dispense: 0.5 mL; Refill: 0 ? ?3. Type 2 diabetes mellitus without complication, without long-term current use of insulin  ? ?Lab Results  ?Component Value Date  ? HGBA1C 8.5 (A) 09/10/2021  ? ? ? ?Follow up: ? ?Follow up in 1 month or sooner if needed ? ? ? ? ?Diabetes Mellitus and Nutrition, Adult ?When you have diabetes, or diabetes mellitus, it is very important to have healthy eating habits because your blood sugar (glucose) levels are greatly affected by what you eat and drink. Eating healthy foods in the right amounts, at about the same times every day, can help you: ?Manage your blood glucose. ?Lower your risk of heart disease. ?Improve your blood pressure. ?Reach or maintain a healthy weight. ?What can affect my meal plan? ?Every person with diabetes is different, and each person has different needs for a meal plan. Your health care provider may recommend that you work with a dietitian to make a meal plan that is best for you. Your meal plan may vary depending on factors such as: ?The calories you need. ?The medicines you take. ?Your weight. ?Your blood glucose, blood pressure, and cholesterol levels. ?Your activity level. ?Other health conditions you have, such as heart or kidney disease. ?How do carbohydrates affect me? ?Carbohydrates, also called carbs, affect your blood glucose level more than any other type of food. Eating carbs raises the amount of glucose in your blood. ?It is important to know how many carbs you can safely have in each meal. This is different for every person. Your dietitian can help you calculate how many carbs you should have at each meal and for each snack. ?How does  alcohol affect me? ?Alcohol can cause a decrease in blood glucose (hypoglycemia), especially if you use insulin or take certain diabetes medicines by mouth. Hypoglycemia can be a life-threatening condition. Symptoms of hypoglycemia, such as sleepiness, dizziness, and confusion, are similar to symptoms of having too much alcohol. ?Do not drink alcohol if: ?Your health care provider tells you not to drink. ?You are pregnant, may be pregnant, or are planning to become pregnant. ?If you drink alcohol: ?Limit how much you have to: ?0-1 drink a day for women. ?0-2 drinks a day for men. ?Know how much alcohol is in your drink. In the [place] one drink equals one 12 oz bottle of beer (355 mL), one 5 oz glass of wine (148 mL), or one 1? oz glass of hard liquor (44 mL). ?Keep yourself hydrated with water, diet soda, or unsweetened iced tea. Keep in mind that regular soda, juice, and other mixers may contain a lot of sugar and must be counted as carbs. ?What are tips for following this plan? ? ?Reading food labels ?Start by checking the serving size on the Nutrition Facts label of packaged foods and drinks. The number of calories and the amount of carbs, fats, and other nutrients listed on the label are based on one serving of the item. Many items contain more than one serving per package. ?Check the total grams (g) of carbs in one serving. ?Check the number of grams of saturated fats and trans fats in one serving. Choose foods that have a low  amount or none of these fats. ?Check the number of milligrams (mg) of salt (sodium) in one serving. Most people should limit total sodium intake to less than 2,300 mg per day. ?Always check the nutrition information of foods labeled as "low-fat" or "nonfat." These foods may be higher in added sugar or refined carbs and should be avoided. ?Talk to your dietitian to identify your daily goals for nutrients listed on the label. ?Shopping ?Avoid buying canned, pre-made, or processed foods.  These foods tend to be high in fat, sodium, and added sugar. ?Shop around the outside edge of the grocery store. This is where you will most often find fresh fruits and vegetables, bulk grains, fresh meats, and fresh dairy products. ?Cooking ?Use low-heat cooking methods, such as baking, instead of high-heat cooking methods, such as deep frying. ?Cook using healthy oils, such as olive, canola, or sunflower oil. ?Avoid cooking with butter, cream, or high-fat meats. ?Meal planning ?Eat meals and snacks regularly, preferably at the same times every day. Avoid going long periods of time without eating. ?Eat foods that are high in fiber, such as fresh fruits, vegetables, beans, and whole grains. ?Eat 4-6 oz (112-168 g) of lean protein each day, such as lean meat, chicken, fish, eggs, or tofu. One ounce (oz) (28 g) of lean protein is equal to: ?1 oz (28 g) of meat, chicken, or fish. ?1 egg. ?? cup (62 g) of tofu. ?Eat some foods each day that contain healthy fats, such as avocado, nuts, seeds, and fish. ?What foods should I eat? ?Fruits ?Berries. Apples. Oranges. Peaches. Apricots. Plums. Grapes. Mangoes. Papayas. Pomegranates. Kiwi. Cherries. ?Vegetables ?Leafy greens, including lettuce, spinach, kale, chard, collard greens, mustard greens, and cabbage. Beets. Cauliflower. Broccoli. Carrots. Green beans. Tomatoes. Peppers. Onions. Cucumbers. Brussels sprouts. ?Grains ?Whole grains, such as whole-wheat or whole-grain bread, crackers, tortillas, cereal, and pasta. Unsweetened oatmeal. Quinoa. Brown or wild rice. ?Meats and other proteins ?Seafood. Poultry without skin. Lean cuts of poultry and beef. Tofu. Nuts. Seeds. ?Dairy ?Low-fat or fat-free dairy products such as milk, yogurt, and cheese. ?The items listed above may not be a complete list of foods and beverages you can eat and drink. Contact a dietitian for more information. ?What foods should I avoid? ?Fruits ?Fruits canned with syrup. ?Vegetables ?Canned vegetables.  Frozen vegetables with butter or cream sauce. ?Grains ?Refined white flour and flour products such as bread, pasta, snack foods, and cereals. Avoid all processed foods. ?Meats and other proteins ?Fatty cuts of meat. Poultry with skin. Breaded or fried meats. Processed meat. Avoid saturated fats. ?Dairy ?Full-fat yogurt, cheese, or milk. ?Beverages ?Sweetened drinks, such as soda or iced tea. ?The items listed above may not be a complete list of foods and beverages you should avoid. Contact a dietitian for more information. ?Questions to ask a health care provider ?Do I need to meet with a certified diabetes care and education specialist? ?Do I need to meet with a dietitian? ?What number can I call if I have questions? ?When are the best times to check my blood glucose? ?Where to find more information: ?American Diabetes Association: diabetes.org ?Academy of Nutrition and Dietetics: eatright.org ?[employer] of Diabetes and Digestive and Kidney Diseases: AmenCredit.is ?Association of Diabetes Care & Education Specialists: diabeteseducator.org ?Summary ?It is important to have healthy eating habits because your blood sugar (glucose) levels are greatly affected by what you eat and drink. It is important to use alcohol carefully. ?A healthy meal plan will help you manage your blood glucose  and lower your risk of heart disease. ?Your health care provider may recommend that you work with a dietitian to make a meal plan that is best for you. ?This information is not intended to replace advice given to you by your health care provider. Make sure you discuss any questions you have with your health care provider. ?Document Revised: 03/04/2020 Document Reviewed: 03/04/2020 ?Elsevier Patient Education ? [place]. ? ? ? ?

## 2021-12-17 ENCOUNTER — Ambulatory Visit: Payer: Medicaid Other | Admitting: Nurse Practitioner

## 2021-12-17 ENCOUNTER — Encounter: Payer: Self-pay | Admitting: Nurse Practitioner

## 2021-12-17 DIAGNOSIS — E119 Type 2 diabetes mellitus without complications: Secondary | ICD-10-CM | POA: Insufficient documentation

## 2021-12-17 NOTE — Assessment & Plan Note (Signed)
-   omega-3 acid ethyl esters (LOVAZA) 1 g capsule; TAKE 1 CAPSULE BY MOUTH EVERY DAY  Dispense: 30 capsule; Refill: 2 ? ?2. Obesity (BMI 30-39.9) ? ?- Semaglutide-Weight Management (WEGOVY) 0.25 MG/0.5ML SOAJ; Inject 0.25 mg into the skin once a week for 7 days.  Dispense: 0.5 mL; Refill: 0 ? ?3. Type 2 diabetes mellitus without complication, without long-term current use of insulin  ? ?Lab Results  ?Component Value Date  ? HGBA1C 8.5 (A) 09/10/2021  ? ? ? ?Follow up: ? ?Follow up in 1 month or sooner if needed ?

## 2021-12-20 ENCOUNTER — Telehealth: Payer: Self-pay

## 2021-12-20 NOTE — Telephone Encounter (Signed)
Prior Authorization for [facility] started waiting on Status: Denied. Notification: Completed. ?PA Case: [id number] ?

## 2021-12-23 ENCOUNTER — Encounter: Payer: Self-pay | Admitting: Internal Medicine

## 2021-12-23 ENCOUNTER — Ambulatory Visit: Payer: Medicaid Other | Admitting: Internal Medicine

## 2021-12-23 VITALS — BP 125/60 | HR 78 | Ht 59.0 in | Wt 187.4 lb

## 2021-12-23 DIAGNOSIS — E782 Mixed hyperlipidemia: Secondary | ICD-10-CM | POA: Diagnosis not present

## 2021-12-23 DIAGNOSIS — E669 Obesity, unspecified: Secondary | ICD-10-CM

## 2021-12-23 DIAGNOSIS — R072 Precordial pain: Secondary | ICD-10-CM

## 2021-12-23 DIAGNOSIS — I10 Essential (primary) hypertension: Secondary | ICD-10-CM | POA: Diagnosis not present

## 2021-12-23 DIAGNOSIS — M791 Myalgia, unspecified site: Secondary | ICD-10-CM

## 2021-12-23 DIAGNOSIS — T466X5D Adverse effect of antihyperlipidemic and antiarteriosclerotic drugs, subsequent encounter: Secondary | ICD-10-CM

## 2021-12-23 DIAGNOSIS — Z7189 Other specified counseling: Secondary | ICD-10-CM

## 2021-12-23 DIAGNOSIS — Z789 Other specified health status: Secondary | ICD-10-CM

## 2021-12-23 NOTE — Patient Instructions (Signed)
Medication Instructions:  ?No Changes In Medications at this time.  ?*If you need a refill on your cardiac medications before your next appointment, please call your pharmacy* ? ?Lab Work: ?None Ordered At This Time.  ?If you have labs (blood work) drawn today and your tests are completely normal, you will receive your results only by: ?MyChart Message (if you have MyChart) OR ?A paper copy in the mail ?If you have any lab test that is abnormal or we need to change your treatment, we will call you to review the results. ? ?Testing/Procedures: ?None Ordered At This Time.  ? ?Follow-Up: ?At [facility], you and your health needs are our priority.  As part of our continuing mission to provide you with exceptional heart care, we have created designated Provider Care Teams.  These Care Teams include your primary Cardiologist (physician) and Advanced Practice Providers (APPs -  Physician Assistants and Nurse Practitioners) who all work together to provide you with the care you need, when you need it. ? ?[facility] TO DISCUSS CHOLESTEROL MANAGEMENT  ? ?Your next appointment:   ?6 month(s) ? ?The format for your next appointment:   ?In Person ? ?Provider:   ?Elouise Munroe, MD   ? ? ? ? ?  ?

## 2021-12-23 NOTE — Progress Notes (Signed)
Cardiology Office Note:    Date:  12/23/2021   ID:  Jessica Lawson, DOB 1966/02/27, MRN [id number]  PCP:  Fenton Foy, NP  Cardiologist:  Elouise Munroe, MD  Electrophysiologist:  None   Referring MD: Vevelyn Francois, NP   Chief Complaint/Reason for Referral: Chest pain  History of Present Illness:    Jessica Lawson is a 56 y.o. female with a history of DM 2 on Metformin, hyperlipidemia/hypertriglyceridemia, hypertension.  Per patient, weight has increased. I see an approximately 5 lb weight gain since last summer. Tried to get [facility] from PCP however not approved by insurance. Lipids remain suboptimally controlled and HbA1c is 8.6%. She feels her trigs will improve with diabetes management and weight management. Previously seen by Mali Hilty in our practice, will consider referral to [facility] for continued management. Will consider Vascepa if covered by insurance.  The patient denies chest pain, chest pressure, dyspnea at rest or with exertion, palpitations, PND, orthopnea, or leg swelling. Denies cough, fever, chills. Denies nausea, vomiting. Denies syncope or presyncope. Denies dizziness or lightheadedness.    Past Medical History:  Diagnosis Date   Allergy    Back pain    Diabetes mellitus without complication (HCC)    High blood cholesterol    Hypertension    Kidney stone    Normal cardiac stress test 02/2016   low risk   Obesity    Pain of cheek    and right side neck   Tobacco use    UTI (lower urinary tract infection)    Wrist pain    left wrist    Past Surgical History:  Procedure Laterality Date   CESAREAN SECTION     3 c-sections   DENTAL SURGERY     teeth removed with meds to calm     Current Medications: Current Meds  Medication Sig   Accu-Chek Softclix Lancets lancets USE AS DIRECTED FOUR TIMES DAILY.   ANEFRIN NASAL SPRAY 0.05 % nasal spray Place into both nostrils.   ascorbic acid (VITAMIN C) 1000 MG tablet Take 2,000 mg by mouth daily.    blood glucose meter kit and supplies KIT Dispense based on patient and insurance preference. Use up to four times daily as directed. (FOR ICD-9 250.00, 250.01).   cetirizine (ZYRTEC) 10 MG tablet TAKE 1 TABLET(10 MG) BY MOUTH DAILY   Cholecalciferol (VITAMIN D3) 50 MCG (2000 UT) TABS Take 1 tablet by mouth daily.   diclofenac Sodium (VOLTAREN) 1 % GEL APPLY TO LEFT SIDED FOOT TWICE DAILY FOR 5 DAYS AS NEEDED   ezetimibe (ZETIA) 10 MG tablet TAKE 1 TABLET(10 MG) BY MOUTH DAILY   fenofibrate (TRICOR) 48 MG tablet TAKE 1 TABLET(48 MG) BY MOUTH DAILY   fluconazole (DIFLUCAN) 150 MG tablet Take 1 tablet (150 mg total) by mouth daily.   fluticasone (FLONASE) 50 MCG/ACT nasal spray Place 2 sprays into both nostrils daily.   glucose blood (ACCU-CHEK GUIDE) test strip USE AS DIRECTED FOUR TIMES DAILY.   hydrochlorothiazide (HYDRODIURIL) 25 MG tablet Take 25 mg by mouth daily.   loratadine (CLARITIN) 10 MG tablet Take 1 tablet (10 mg total) by mouth daily.   metFORMIN (GLUCOPHAGE) 500 MG tablet Take 1 tablet (500 mg total) by mouth 2 (two) times daily with a meal.   methocarbamol (ROBAXIN) 500 MG tablet Take 1 tablet (500 mg total) by mouth 2 (two) times daily as needed for muscle spasms.   montelukast (SINGULAIR) 10 MG tablet Take 1 tablet (10 mg  total) by mouth daily.   Multiple Vitamins-Minerals (MULTIVITAMIN WITH MINERALS) tablet Take 1 tablet by mouth daily.   omega-3 acid ethyl esters (LOVAZA) 1 g capsule TAKE 1 CAPSULE BY MOUTH EVERY DAY   ondansetron (ZOFRAN) 4 MG tablet Take 1 tablet (4 mg total) by mouth every 8 (eight) hours as needed for nausea or vomiting.   PROAIR HFA 108 (90 Base) MCG/ACT inhaler INHALE 2 PUFFS INTO THE LUNGS EVERY 4 HOURS AS NEEDED FOR WHEEZING OR SHORTNESS OF BREATH   Probiotic Product (PROBIOTIC-10 PO) Take by mouth.   rosuvastatin (CRESTOR) 10 MG tablet Take 1 tablet (10 mg total) by mouth daily.   Semaglutide-Weight Management (WEGOVY) 0.25 MG/0.5ML SOAJ Inject 0.25  mg into the skin once a week for 7 days.   valACYclovir (VALTREX) 1000 MG tablet Take 1 tablet (1,000 mg total) by mouth 2 (two) times daily.     Allergies:   Asa [aspirin], Iohexol, Penicillins, Sulfa antibiotics, and Nystatin   Social History   Tobacco Use   Smoking status: Former    Packs/day: 1.00    Types: Cigarettes    Quit date: 07/26/2016    Years since quitting: 5.4   Smokeless tobacco: Never  Vaping Use   Vaping Use: Never used  Substance Use Topics   Alcohol use: No   Drug use: No     Family History: The patient's family history includes Diabetes in her mother, sister, sister, and another family member; Hyperlipidemia in an other family member; Hypertension in her sister and another family member; Lung disease in her father. There is no history of Colon cancer, Colon polyps, Esophageal cancer, Stomach cancer, or Rectal cancer.  ROS:   Please see the history of present illness.    All other systems reviewed and are negative.  EKGs/Labs/Other Studies Reviewed:    The following studies were reviewed today:  EKG:  NSR, Nonspecific T wave abnl  Recent Labs: 03/10/2021: Hemoglobin 14.1; Platelets 340; TSH 0.706 09/10/2021: BUN 14; Creatinine, Ser 0.88; Potassium 4.0; Sodium 140  Recent Lipid Panel    Component Value Date/Time   CHOL 174 09/10/2021 1530   TRIG 194 (H) 09/10/2021 1530   HDL 38 (L) 09/10/2021 1530   CHOLHDL 4.6 (H) 09/10/2021 1530   CHOLHDL 7.3 (H) 07/12/2017 0950   VLDL 50 (H) 04/06/2017 0902   LDLCALC 102 (H) 09/10/2021 1530   LDLCALC 183 (H) 07/12/2017 0950    Physical Exam:    VS:  BP 125/60   Pulse 78   Ht _0  (1.499 m)   Wt 187 lb 6.4 oz (85 kg)   LMP 08/22/2011 Comment: patient states LMP 20 years ago  SpO2 98%   BMI 37.85 kg/m     Wt Readings from Last 5 Encounters:  12/23/21 187 lb 6.4 oz (85 kg)  12/16/21 188 lb (85.3 kg)  05/06/21 183 lb (83 kg)  04/14/21 183 lb 9.6 oz (83.3 kg)  03/10/21 182 lb 0.8 oz (82.6 kg)     Constitutional: No acute distress Eyes: sclera non-icteric, normal conjunctiva and lids ENMT: normal dentition, moist mucous membranes Cardiovascular: regular rhythm, normal rate, no murmurs. S1 and S2 normal. Radial pulses normal bilaterally. No jugular venous distention.  Respiratory: clear to auscultation bilaterally GI : normal bowel sounds, soft and nontender. No distention.   MSK: extremities warm, well perfused. No edema.  NEURO: grossly nonfocal exam, moves all extremities. PSYCH: alert and oriented x 3, normal mood and affect.   ASSESSMENT:    1.  Hypercholesterolemia with hypertriglyceridemia   2. Precordial pain   3. Essential hypertension   4. Obesity (BMI 30-39.9)   5. Myalgia due to statin   6. Statin intolerance   7. Cardiac risk counseling     PLAN:    Precordial pain - no recurrence. Thinks GI related. Follow and stress test if symptoms. Consider PET-CT MPI if recurrent symptoms.   Myalgia due to statin Statin intolerance Hypercholesterolemia with hypertriglyceridemia - follows with lipid clinic - continue crestor 10 mg daily - continue zetia 10 mg daily - continue fenofibrate 48 mg daily - continue lovaza 1 g daily.  - will refer to [facility] for consideration of Vascepa or alternate therapy. Can also consider weight management therapy/DM therapy.  Essential hypertension - stable BP on HCTZ 25 mg daily  Total time of encounter: 35 minutes total time of encounter, including 20 minutes spent in face-to-face patient care on the date of this encounter. This time includes coordination of care and counseling regarding above mentioned problem list. Remainder of non-face-to-face time involved reviewing chart documents/testing relevant to the patient encounter and documentation in the medical record. I have independently reviewed documentation from referring provider.   Jessica Kaiser, MD, [facility]     Medication Adjustments/Labs and Tests  Ordered: Current medicines are reviewed at length with the patient today.  Concerns regarding medicines are outlined above.   Orders Placed This Encounter  Procedures   AMB Referral to [facility] Pharm-D   EKG 12-Lead    Shared Decision Making/Informed Consent:     No orders of the defined types were placed in this encounter.    Patient Instructions  Medication Instructions:  No Changes In Medications at this time.  *If you need a refill on your cardiac medications before your next appointment, please call your pharmacy*  Lab Work: None Ordered At This Time.  If you have labs (blood work) drawn today and your tests are completely normal, you will receive your results only by: [place] (if you have MyChart) OR A paper copy in the mail If you have any lab test that is abnormal or we need to change your treatment, we will call you to review the results.  Testing/Procedures: None Ordered At This Time.   Follow-Up: At [facility], you and your health needs are our priority.  As part of our continuing mission to provide you with exceptional heart care, we have created designated Provider Care Teams.  These Care Teams include your primary Cardiologist (physician) and Advanced Practice Providers (APPs -  Physician Assistants and Nurse Practitioners) who all work together to provide you with the care you need, when you need it.  PLEASE SCHEDULE APPOINTMENT WITH PHARMACY LIPID CLINIC TO DISCUSS CHOLESTEROL MANAGEMENT   Your next appointment:   6 month(s)  The format for your next appointment:   In Person  Provider:   Elouise Munroe, MD

## 2021-12-27 ENCOUNTER — Telehealth: Payer: Self-pay

## 2021-12-27 NOTE — Telephone Encounter (Signed)
I called to patient advise her that the prior authorization was denied for [facility] the medication is not covered under member benefit plan ?

## 2021-12-29 ENCOUNTER — Other Ambulatory Visit: Payer: Self-pay | Admitting: Nurse Practitioner

## 2021-12-29 DIAGNOSIS — E669 Obesity, unspecified: Secondary | ICD-10-CM

## 2021-12-29 DIAGNOSIS — E119 Type 2 diabetes mellitus without complications: Secondary | ICD-10-CM

## 2021-12-29 NOTE — Telephone Encounter (Signed)
Hey - did we find out anything on this medication? ?

## 2021-12-29 NOTE — Telephone Encounter (Signed)
Patient will have to be referred to medical weight management. Could you please place this referral. Thanks.  ?

## 2021-12-31 ENCOUNTER — Encounter: Payer: Self-pay | Admitting: Pharmacist

## 2021-12-31 ENCOUNTER — Ambulatory Visit: Payer: Medicaid Other | Admitting: Pharmacist

## 2021-12-31 VITALS — BP 146/83 | HR 85 | Ht 59.0 in | Wt 188.8 lb

## 2021-12-31 DIAGNOSIS — E119 Type 2 diabetes mellitus without complications: Secondary | ICD-10-CM | POA: Diagnosis not present

## 2021-12-31 MED ORDER — ACCU-CHEK GUIDE VI STRP: ORAL_STRIP | 3 refills | Status: AC

## 2021-12-31 MED ORDER — ACCU-CHEK GUIDE VI STRP: ORAL_STRIP | 3 refills | Status: DC

## 2021-12-31 MED ORDER — ACCU-CHEK GUIDE W/DEVICE KIT: PACK | 0 refills | Status: AC

## 2021-12-31 MED ORDER — METFORMIN HCL ER 500 MG PO TB24: 500.0000 mg | ORAL_TABLET | Freq: Two times a day (BID) | ORAL | 5 refills | Status: DC

## 2021-12-31 MED ORDER — OZEMPIC (0.25 OR 0.5 MG/DOSE) 2 MG/3ML ~~LOC~~ SOPN: PEN_INJECTOR | SUBCUTANEOUS | 1 refills | Status: DC

## 2021-12-31 MED ORDER — ACCU-CHEK GUIDE W/DEVICE KIT: PACK | 0 refills | Status: DC

## 2021-12-31 NOTE — Progress Notes (Signed)
Patient ID: [name]                 DOB: 11-29-1965                    MRN: [id number]     HPI: Jessica Lawson is a 56 y.o. female patient referred to lipid clinic by Dr Margaretann Loveless. PMH is significant for T2DM (A1c 8.6), HTN, HLD, and obesity.  Patient is intolerant to statins in dosages above rosuvastatin 10mg .  Patient presents today to discuss cholesterol and weight management. Reports PCP tried to prescribe her Mancel Parsons however it is not covered by Medicaid. Knows that her issue is portion sizes. She reports she simply does not feel full while she is eating.  Works as a Geophysical data processor at 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 Takes care of her 32 year old granddaughter as well so she is very busy.  Has been through multiple DM and weight management classes and reports they are ineffective. Is familiar with the plate method and food groups however this does not solve the issue that she remains hungry. Believes this is the reason her BP and LDL/triglycerides are elevated.  Reports her home glucometer is broken so she has not been checking blood sugar.  Currently managed on metofmrin 500mg  BID however it causes frequent diarrhea.  Current Medications:  Lovaza 1gram BID Rosuvastatin 10mg  daily Zetia 10mg  daily Fenofibrate 48mg  daily  Labs: TC 174, Trigs 194, HDL 38, LDL 102 (09/10/21)  Past Medical History:  Diagnosis Date   Allergy    Back pain    Diabetes mellitus without complication ([place])    High blood cholesterol    Hypertension    Kidney stone    Normal cardiac stress test 02/2016   low risk   Obesity    Pain of cheek    and right side neck   Tobacco use    UTI (lower urinary tract infection)    Wrist pain    left wrist    Current Outpatient Medications on File Prior to Visit  Medication Sig Dispense Refill   Accu-Chek Softclix Lancets lancets USE AS DIRECTED FOUR TIMES DAILY. 100 each 3   ANEFRIN NASAL SPRAY 0.05 % nasal spray Place into both nostrils.      ascorbic acid (VITAMIN C) 1000 MG tablet Take 2,000 mg by mouth daily.     blood glucose meter kit and supplies KIT Dispense based on patient and insurance preference. Use up to four times daily as directed. (FOR ICD-9 250.00, 250.01). 1 each 0   cetirizine (ZYRTEC) 10 MG tablet TAKE 1 TABLET(10 MG) BY MOUTH DAILY 90 tablet 3   Cholecalciferol (VITAMIN D3) 50 MCG (2000 UT) TABS Take 1 tablet by mouth daily.     diclofenac Sodium (VOLTAREN) 1 % GEL APPLY TO LEFT SIDED FOOT TWICE DAILY FOR 5 DAYS AS NEEDED     ezetimibe (ZETIA) 10 MG tablet TAKE 1 TABLET(10 MG) BY MOUTH DAILY 90 tablet 3   fenofibrate (TRICOR) 48 MG tablet TAKE 1 TABLET(48 MG) BY MOUTH DAILY 90 tablet 3   fluconazole (DIFLUCAN) 150 MG tablet Take 1 tablet (150 mg total) by mouth daily. 1 tablet 0   fluticasone (FLONASE) 50 MCG/ACT nasal spray Place 2 sprays into both nostrils daily. 16 g 6   glucose blood (ACCU-CHEK GUIDE) test strip USE AS DIRECTED FOUR TIMES DAILY. 100 strip 3   hydrochlorothiazide (HYDRODIURIL) 25 MG tablet Take 25 mg by  mouth daily.     loratadine (CLARITIN) 10 MG tablet Take 1 tablet (10 mg total) by mouth daily. 30 tablet 11   metFORMIN (GLUCOPHAGE) 500 MG tablet Take 1 tablet (500 mg total) by mouth 2 (two) times daily with a meal. 180 tablet 3   methocarbamol (ROBAXIN) 500 MG tablet Take 1 tablet (500 mg total) by mouth 2 (two) times daily as needed for muscle spasms. 30 tablet 2   montelukast (SINGULAIR) 10 MG tablet Take 1 tablet (10 mg total) by mouth daily. 90 tablet 3   Multiple Vitamins-Minerals (MULTIVITAMIN WITH MINERALS) tablet Take 1 tablet by mouth daily.     omega-3 acid ethyl esters (LOVAZA) 1 g capsule TAKE 1 CAPSULE BY MOUTH EVERY DAY 30 capsule 2   ondansetron (ZOFRAN) 4 MG tablet Take 1 tablet (4 mg total) by mouth every 8 (eight) hours as needed for nausea or vomiting. 30 tablet 1   PROAIR HFA 108 (90 Base) MCG/ACT inhaler INHALE 2 PUFFS INTO THE LUNGS EVERY 4 HOURS AS NEEDED FOR WHEEZING OR  SHORTNESS OF BREATH 8.5 g 3   Probiotic Product (PROBIOTIC-10 PO) Take by mouth.     rosuvastatin (CRESTOR) 10 MG tablet Take 1 tablet (10 mg total) by mouth daily. 90 tablet 0   valACYclovir (VALTREX) 1000 MG tablet Take 1 tablet (1,000 mg total) by mouth 2 (two) times daily. 20 tablet 0   No current facility-administered medications on file prior to visit.    Allergies  Allergen Reactions   Asa [Aspirin] Anaphylaxis, Hives and Swelling   Iohexol Other (See Comments)     Desc: pt complains of difficulty swallowing and thickened tongue/ throat closes up    Penicillins Hives, Swelling and Other (See Comments)    Thrush    Sulfa Antibiotics Anaphylaxis, Hives, Swelling and Other (See Comments)   Nystatin Other (See Comments)    thrush    Assessment/Plan:  1. Hyperlipidemia- Patient's triglycerides 194 which is above goal of <150. Likely to reach goal if she is able to control blood sugar.  2. T2DM - Patient's A1c 8.6 is above goal of >7.0.  Explained pathophysiology of glucose metabolism and importance of diet, exercise, and medication compliance in reaching goal. Patient has been through DM education classes, however her main complaint is she does not feel full. Would be a good candidate for Ozempic.  Explained mechanism of action of Ozempic and using demo pen, educated patient how to attach needle, prime pen, site selection, dialing dose, and administration. Will inject 0.$RemoveBefor'25mg'[id number]$  once weekly for 4 weeks and then increase to 0.$RemoveBefor'5mg'[id number]$  once weekly. Should help with blood glucose management, satiety, and weight loss. Will complete PA and contact patient when approved.    Since patient reports frequent diarrhea with metformin, recommended switching to extended release formulation and patient is agreeable. Will also send new Rx for glucometer and test strips. Patient appreciative.  Karren Cobble, PharmD, BCACP, [place], [place], [place], [place], [address] Phone: [phone],  Fax: [phone]

## 2021-12-31 NOTE — Patient Instructions (Addendum)
It was nice meeting you today  I would like your blood sugar in the mornings to be between 80-130.  I have sent in a new meter and test strips for you  I have switched your metformin to the extended release '500mg'$  twice a day  I would like to start a new medication called Ozempic  I will complete the prior authorization for you and contact you when it is approved  Once you start it, your dose will be 0.'25mg'$  once a week for 4 weeks and then increase to 0.'5mg'$  once a week  Please call with any questions  Karren Cobble, PharmD, [place], [place], [place], [place], [place], [address] Phone: [phone], Fax: [phone]

## 2022-01-03 ENCOUNTER — Other Ambulatory Visit: Payer: Self-pay | Admitting: Nurse Practitioner

## 2022-01-03 DIAGNOSIS — E669 Obesity, unspecified: Secondary | ICD-10-CM

## 2022-01-03 NOTE — Telephone Encounter (Signed)
Order placed. Thanks.

## 2022-01-05 ENCOUNTER — Other Ambulatory Visit: Payer: Self-pay | Admitting: Nurse Practitioner

## 2022-01-05 DIAGNOSIS — R110 Nausea: Secondary | ICD-10-CM

## 2022-01-05 MED ORDER — ONDANSETRON HCL 4 MG PO TABS: 4.0000 mg | ORAL_TABLET | Freq: Three times a day (TID) | ORAL | 1 refills | Status: DC | PRN

## 2022-01-07 ENCOUNTER — Other Ambulatory Visit: Payer: Self-pay

## 2022-01-07 DIAGNOSIS — E785 Hyperlipidemia, unspecified: Secondary | ICD-10-CM

## 2022-01-07 MED ORDER — FENOFIBRATE 48 MG PO TABS: ORAL_TABLET | ORAL | 1 refills | Status: DC

## 2022-01-13 ENCOUNTER — Ambulatory Visit: Payer: Medicaid Other | Admitting: Nurse Practitioner

## 2022-01-14 ENCOUNTER — Ambulatory Visit (HOSPITAL_COMMUNITY)
Admission: EM | Admit: 2022-01-14 | Discharge: 2022-01-14 | Disposition: A | Discharge status: Home / Self Care | Payer: Medicaid Other | Attending: Physician Assistant | Admitting: Physician Assistant

## 2022-01-14 ENCOUNTER — Other Ambulatory Visit: Payer: Self-pay | Admitting: Nurse Practitioner

## 2022-01-14 ENCOUNTER — Encounter (HOSPITAL_COMMUNITY): Payer: Self-pay | Admitting: Emergency Medicine

## 2022-01-14 DIAGNOSIS — I10 Essential (primary) hypertension: Secondary | ICD-10-CM

## 2022-01-14 DIAGNOSIS — B356 Tinea cruris: Secondary | ICD-10-CM

## 2022-01-14 MED ORDER — NYSTATIN 100000 UNIT/GM EX CREA: 1.0000 "application " | TOPICAL_CREAM | Freq: Two times a day (BID) | CUTANEOUS | 0 refills | Status: AC

## 2022-01-14 MED ORDER — CICLOPIROX OLAMINE 0.77 % EX CREA: TOPICAL_CREAM | Freq: Two times a day (BID) | CUTANEOUS | 1 refills | Status: AC

## 2022-01-14 NOTE — Telephone Encounter (Signed)
With limited appointments available I can call in nystatin cream for abdomen. Thanks. If issue persists patient will need to be seen.

## 2022-01-14 NOTE — ED Triage Notes (Signed)
Pt is present today with an open wound near her caesarean scar. Pt states that she noticed the open wound x2 days ago. Pt states that she noticed drainage and odor.

## 2022-01-14 NOTE — ED Provider Notes (Signed)
[facility]    CSN: [id number] Arrival date & time: 01/14/22  1636      History   Chief Complaint Chief Complaint  Patient presents with   Wound Check    HPI Jessica Lawson is a 56 y.o. female.   Patient presents today with a 2-day history of rash on her lower abdomen/pannus.  She reports area is burning with the stinging sensation but denies ongoing pain.  She did notice a small wound but has not had any significant bleeding or drainage.  She did note that there was a foul odor coming from this area.  She has a history of diabetes and was recently started on Ozempic in addition to metformin.  Reports that her blood sugars have been better controlled since starting this medication regimen.  She denies history of recurrent skin infections.  She has not tried any over-the-counter medication for symptom management.  She denies any changes to her personal hygiene products including soaps or detergents.  Denies any recent antibiotic use.  Blood pressure is elevated today.  Patient believes this is because she is anxious.  She does have a history of hypertension and has been taking medication as prescribed.  Denies any chest pain, shortness of breath, headache, vision change.   Past Medical History:  Diagnosis Date   Allergy    Back pain    Diabetes mellitus without complication ([facility])    High blood cholesterol    Hypertension    Kidney stone    Normal cardiac stress test 02/2016   low risk   Obesity    Pain of cheek    and right side neck   Tobacco use    UTI (lower urinary tract infection)    Wrist pain    left wrist    Patient Active Problem List   Diagnosis Date Noted   Type 2 diabetes mellitus without complication, without long-term current use of insulin ([place]) 12/17/2021   Hyperglycemia 09/18/2019   Wrist strain, left, initial encounter 09/18/2019   Essential hypertension 11/01/2017   Obesity (BMI 30-39.9) 11/01/2017   Herpes simplex type 1 infection  12/15/2016   Chest pain 03/05/2016   Acute bronchitis 03/05/2016   Environmental allergies 10/26/2015   Hypercholesterolemia with hypertriglyceridemia 09/20/2015   Hyperlipidemia 09/05/2015   Right shoulder pain 09/05/2015   Mouth ulcers 09/05/2015    Past Surgical History:  Procedure Laterality Date   CESAREAN SECTION     3 c-sections   DENTAL SURGERY     teeth removed with meds to calm     OB History     Gravida  3   Para      Term      Preterm      AB      Living  3      SAB      IAB      Ectopic      Multiple      Live Births               Home Medications    Prior to Admission medications   Medication Sig Start Date End Date Taking? Authorizing Provider  ciclopirox (LOPROX) 0.77 % cream Apply topically 2 (two) times daily. 01/14/22  Yes Prentis Langdon K, PA-C  Accu-Chek Softclix Lancets lancets USE AS DIRECTED FOUR TIMES DAILY. 02/03/20   Vevelyn Francois, NP  ANEFRIN NASAL SPRAY 0.05 % nasal spray Place into both nostrils. 02/11/21   [provider]  ascorbic acid (VITAMIN C) 1000 MG tablet Take 2,000 mg by mouth daily. 02/11/21   [provider]  blood glucose meter kit and supplies KIT Dispense based on patient and insurance preference. Use up to four times daily as directed. (FOR ICD-9 250.00, 250.01). 12/16/21   Fenton Foy, NP  Blood Glucose Monitoring Suppl (ACCU-CHEK GUIDE) w/Device KIT Use to check your blood sugar daily 12/31/21   Elouise Munroe, MD  cetirizine (ZYRTEC) 10 MG tablet TAKE 1 TABLET(10 MG) BY MOUTH DAILY 03/10/21   Vevelyn Francois, NP  Cholecalciferol (VITAMIN D3) 50 MCG (2000 UT) TABS Take 1 tablet by mouth daily. 02/11/21   [provider]  ezetimibe (ZETIA) 10 MG tablet TAKE 1 TABLET(10 MG) BY MOUTH DAILY 03/24/21   Elouise Munroe, MD  fenofibrate (TRICOR) 48 MG tablet TAKE 1 TABLET(48 MG) BY MOUTH DAILY 01/07/22   Fenton Foy, NP  fluconazole (DIFLUCAN) 150 MG tablet Take 1 tablet (150 mg  total) by mouth daily. 10/13/21   Vevelyn Francois, NP  fluticasone (FLONASE) 50 MCG/ACT nasal spray Place 2 sprays into both nostrils daily. 01/29/18   Dorena Dew, FNP  glucose blood (ACCU-CHEK GUIDE) test strip USE AS DIRECTED FOUR TIMES DAILY. 12/31/21   Elouise Munroe, MD  hydrochlorothiazide (HYDRODIURIL) 25 MG tablet Take 25 mg by mouth daily. 12/06/21   [provider]  loratadine (CLARITIN) 10 MG tablet Take 1 tablet (10 mg total) by mouth daily. 01/29/18   Dorena Dew, FNP  metFORMIN (GLUCOPHAGE-XR) 500 MG 24 hr tablet Take 1 tablet (500 mg total) by mouth in the morning and at bedtime. 12/31/21   Elouise Munroe, MD  montelukast (SINGULAIR) 10 MG tablet Take 1 tablet (10 mg total) by mouth daily. 05/31/21 05/31/22  Vevelyn Francois, NP  Multiple Vitamins-Minerals (MULTIVITAMIN WITH MINERALS) tablet Take 1 tablet by mouth daily.    [provider]  nystatin cream (MYCOSTATIN) Apply 1 application. topically 2 (two) times daily. 01/14/22   Fenton Foy, NP  omega-3 acid ethyl esters (LOVAZA) 1 g capsule TAKE 1 CAPSULE BY MOUTH EVERY DAY 12/16/21   Fenton Foy, NP  ondansetron (ZOFRAN) 4 MG tablet Take 1 tablet (4 mg total) by mouth every 8 (eight) hours as needed for nausea or vomiting. 01/05/22   Fenton Foy, NP  PROAIR HFA 108 [phone] Base) MCG/ACT inhaler INHALE 2 PUFFS INTO THE LUNGS EVERY 4 HOURS AS NEEDED FOR WHEEZING OR SHORTNESS OF BREATH 10/25/19   Azzie Glatter, FNP  Probiotic Product (PROBIOTIC-10 PO) Take by mouth.    [provider]  rosuvastatin (CRESTOR) 10 MG tablet Take 1 tablet (10 mg total) by mouth daily. 10/27/21   Vevelyn Francois, NP  Semaglutide,0.25 or 0.5MG/DOS, (OZEMPIC, 0.25 OR 0.5 MG/DOSE,) 2 MG/3ML SOPN Inject 0.52m once weekly for 4 weeks then increase to 0.534monce weekly 12/31/21   AcElouise MunroeMD  valACYclovir (VALTREX) 1000 MG tablet Take 1 tablet (1,000 mg total) by mouth 2 (two) times daily. 03/10/21   KiVevelyn FrancoisNP    Family History Family History  Problem Relation Age of Onset   Hyperlipidemia Other    Hypertension Other    Diabetes Other    Diabetes Mother    Lung disease Father    Diabetes Sister    Diabetes Sister    Hypertension Sister    Colon cancer Neg Hx    Colon polyps Neg Hx  Esophageal cancer Neg Hx    Stomach cancer Neg Hx    Rectal cancer Neg Hx     Social History Social History   Tobacco Use   Smoking status: Former    Packs/day: 1.00    Types: Cigarettes    Quit date: 07/26/2016    Years since quitting: 5.4   Smokeless tobacco: Never  Vaping Use   Vaping Use: Never used  Substance Use Topics   Alcohol use: No   Drug use: No     Allergies   Asa [aspirin], Iohexol, Penicillins, Sulfa antibiotics, and Nystatin   Review of Systems Review of Systems  Constitutional:  Negative for activity change, appetite change, fatigue and fever.  Eyes:  Negative for visual disturbance.  Respiratory:  Negative for shortness of breath.   Cardiovascular:  Negative for chest pain.  Gastrointestinal:  Negative for abdominal pain, diarrhea, nausea and vomiting.  Skin:  Positive for rash. Negative for color change and wound.  Neurological:  Negative for dizziness, light-headedness and headaches.    Physical Exam Triage Vital Signs ED Triage Vitals  Enc Vitals Group     BP 01/14/22 1654 (!) 175/103     Pulse Rate 01/14/22 1654 74     Resp 01/14/22 1654 18     Temp 01/14/22 1655 98 F (36.7 C)     Temp src --      SpO2 01/14/22 1654 96 %     Weight --      Height --      Head Circumference --      Peak Flow --      Pain Score 01/14/22 1653 3     Pain Loc --      Pain Edu? --      Excl. in [place]? --    No data found.  Updated Vital Signs BP 140/90   Pulse 74   Temp 98 F (36.7 C)   Resp 18   LMP 08/22/2011 Comment: patient states LMP 20 years ago  SpO2 96%   Visual Acuity Right Eye Distance:   Left Eye Distance:   Bilateral Distance:     Right Eye Near:   Left Eye Near:    Bilateral Near:     Physical Exam Vitals reviewed.  Constitutional:      General: She is awake. She is not in acute distress.    Appearance: Normal appearance. She is well-developed. She is not ill-appearing.     Comments: Very pleasant female appears stated age in no acute distress sitting comfortably in exam room  HENT:     Head: Normocephalic and atraumatic.  Cardiovascular:     Rate and Rhythm: Normal rate and regular rhythm.     Heart sounds: Normal heart sounds, S1 normal and S2 normal. No murmur heard. Pulmonary:     Effort: Pulmonary effort is normal.     Breath sounds: Normal breath sounds. No wheezing, rhonchi or rales.     Comments: Clear to auscultation bilaterally Abdominal:     Palpations: Abdomen is soft.     Tenderness: There is no abdominal tenderness.  Skin:    Findings: Rash present.          Comments: Maculopapular rash with serous drainage and evidence of excoriation.  Psychiatric:        Behavior: Behavior is cooperative.     UC Treatments / Results  Labs (all labs ordered are listed, but only abnormal results are displayed) Labs Reviewed - No data to  display  EKG   Radiology No results found.  Procedures Procedures (including critical care time)  Medications Ordered in UC Medications - No data to display  Initial Impression / Assessment and Plan / UC Course  I have reviewed the triage vital signs and the nursing notes.  Pertinent labs & imaging results that were available during my care of the patient were reviewed by me and considered in my medical decision making (see chart for details).     Symptoms consistent with tinea curis.  We will start Loprox twice daily for a minimum of 2 weeks even if symptoms improve sooner.  Recommended that she keep area clean with soap and water.  Discussed that hyperglycemia can exacerbate symptoms and she should monitor her blood sugar closely.  If she has any  worsening symptoms including spread of rash she is to return for reevaluation.  Should return precautions given to which she expressed understanding.  Blood pressure was initially elevated.  Patient denies signs/symptoms of endorgan damage.  Recheck was more appropriate at 140/90.  She is to continue her medication as prescribed and follow-up with her PCP as scheduled.  If she develops any chest pain, shortness of breath, headache, vision change in the setting of high blood pressure she needs to be seen immediately to which she expressed understanding.  Final Clinical Impressions(s) / UC Diagnoses   Final diagnoses:  Tinea cruris  Elevated blood pressure reading in office with diagnosis of hypertension     Discharge Instructions      Your blood pressure was improved.  Please monitor this at home and follow-up with your PCP.  Keep area clean with soap and water and apply Loprox twice daily for minimum of 2 weeks.  Make sure you are monitoring your blood sugars as high blood sugar can make this worse.  If you have spread of rash or additional symptoms you need to be reevaluated.     ED Prescriptions     Medication Sig Dispense Auth. Provider   ciclopirox (LOPROX) 0.77 % cream Apply topically 2 (two) times daily. 30 g Chelli Yerkes, Derry Skill, PA-C      PDMP not reviewed this encounter.   Terrilee Croak, PA-C 01/14/22 1804

## 2022-01-14 NOTE — Discharge Instructions (Signed)
Your blood pressure was improved.  Please monitor this at home and follow-up with your PCP.  Keep area clean with soap and water and apply Loprox twice daily for minimum of 2 weeks.  Make sure you are monitoring your blood sugars as high blood sugar can make this worse.  If you have spread of rash or additional symptoms you need to be reevaluated.

## 2022-02-01 ENCOUNTER — Other Ambulatory Visit: Payer: Self-pay

## 2022-02-01 MED ORDER — ROSUVASTATIN CALCIUM 10 MG PO TABS: 10.0000 mg | ORAL_TABLET | Freq: Every day | ORAL | 0 refills | Status: DC

## 2022-02-03 ENCOUNTER — Ambulatory Visit (INDEPENDENT_AMBULATORY_CARE_PROVIDER_SITE_OTHER): Payer: Medicaid Other | Admitting: Nurse Practitioner

## 2022-02-03 ENCOUNTER — Other Ambulatory Visit: Payer: Self-pay

## 2022-02-03 DIAGNOSIS — I10 Essential (primary) hypertension: Secondary | ICD-10-CM

## 2022-02-04 ENCOUNTER — Telehealth: Payer: Self-pay

## 2022-02-04 NOTE — Progress Notes (Signed)
Enrollment in [facility] program

## 2022-02-17 ENCOUNTER — Other Ambulatory Visit: Payer: Self-pay | Admitting: Pharmacist

## 2022-02-17 VITALS — BP 107/73 | HR 78 | Wt 183.4 lb

## 2022-02-17 DIAGNOSIS — I10 Essential (primary) hypertension: Secondary | ICD-10-CM

## 2022-02-17 DIAGNOSIS — E119 Type 2 diabetes mellitus without complications: Secondary | ICD-10-CM

## 2022-02-17 NOTE — Chronic Care Management (AMB) (Signed)
Chief Complaint  Patient presents with   Hypertension    Jessica Lawson is a 56 y.o. year old female who presented for a telephone visit.   They were referred to the pharmacist by a quality report for assistance in managing hypertension.   Patient is participating in a Managed Medicaid Plan:  Yes  Subjective:  Care Team: Primary Care Provider: Fenton Foy, NP ; Next Scheduled Visit: needs to schedule Cardiologist: Margaretann Loveless; Next Scheduled Visit: 07/29/22  Medication Access/Adherence  Current Pharmacy:  [facility] [place], [place] [address] [place] [address] Phone: [phone] Fax: [phone]   Patient reports affordability concerns with their medications: No  Patient reports access/transportation concerns to their pharmacy: No  Patient reports adherence concerns with their medications:  No     Diabetes:  Current medications: metformin XR 500 mg twice daily, Ozempic 0.5 mg weekly x 1 week  Confirms reduction in diarrhea since starting XR metformin. Endorses nausea periodically since increasing Ozempic to 0.5 mg weekly. Has 1 more dose. Asks about increasing to next dose.   Current glucose readings: fastings 140-150s; post prandial ~90s  Reports feeling shaky around 90s.   Hypertension:  Current medications: HCTZ 25 mg daily  Patient has a validated, automated, upper arm home BP cuff from [facility] [facility] system Current blood pressure readings readings: 102-120/60-80s, HR 60-80s  Patient denies hypotensive s/sx including dizziness, lightheadedness.  Patient denies hypertensive symptoms including headache, chest pain, shortness of breath   Hyperlipidemia/ASCVD Risk Reduction  Current lipid lowering medications: rosuvastatin 10 mg daily, ezetimibe 10 mg daily, fenofibrate 48 mg daily, omega 3 acid ethyl esters 1 g twice daily    Health Maintenance  Health  Maintenance Due  Topic Date Due   PAP SMEAR-Modifier  10/30/2015   Zoster Vaccines- Shingrix (1 of 2) Never done   COVID-19 Vaccine (3 - Pfizer series) 04/24/2020   URINE MICROALBUMIN  10/16/2021   OPHTHALMOLOGY EXAM  10/28/2021     Objective: Lab Results  Component Value Date   HGBA1C 8.6 (A) 12/16/2021   HGBA1C 8.6 12/16/2021   HGBA1C 8.6 (A) 12/16/2021   HGBA1C 8.6 (A) 12/16/2021    Lab Results  Component Value Date   CREATININE 0.88 09/10/2021   BUN 14 09/10/2021   NA 140 09/10/2021   K 4.0 09/10/2021   CL 98 09/10/2021   CO2 25 06/04/2019    Lab Results  Component Value Date   CHOL 174 09/10/2021   HDL 38 (L) 09/10/2021   LDLCALC 102 (H) 09/10/2021   TRIG 194 (H) 09/10/2021   CHOLHDL 4.6 (H) 09/10/2021    Medications Reviewed Today     Reviewed by Osker Mason, RPH-CPP (Pharmacist) on 02/17/22 at 1644  Med List Status: <None>   Medication Order Taking? Sig Documenting Provider Last Dose Status Informant  Accu-Chek Softclix Lancets lancets [id number]  USE AS DIRECTED FOUR TIMES DAILY. Vevelyn Francois, NP  Active   ANEFRIN NASAL SPRAY 0.05 % nasal spray [id number]  Place into both nostrils. [provider]  Active   ascorbic acid (VITAMIN C) 1000 MG tablet [id number]  Take 2,000 mg by mouth daily. [provider]  Active   blood glucose meter kit and supplies KIT [id number]  Dispense based on patient and insurance preference. Use up to four times daily as directed. (FOR ICD-9 250.00, 250.01). Fenton Foy, NP  Active   Blood Glucose  Monitoring Suppl (ACCU-CHEK GUIDE) w/Device KIT [id number]  Use to check your blood sugar daily Elouise Munroe, MD  Active   cetirizine (ZYRTEC) 10 MG tablet [id number]  TAKE 1 TABLET(10 MG) BY MOUTH DAILY Vevelyn Francois, NP  Active   Cholecalciferol (VITAMIN D3) 50 MCG (2000 UT) TABS [id number]  Take 1 tablet by mouth daily. [provider]  Active   ciclopirox (LOPROX) 0.77 % cream [id number]  Apply  topically 2 (two) times daily. Raspet, Derry Skill, PA-C  Active   ezetimibe (ZETIA) 10 MG tablet [id number]  TAKE 1 TABLET(10 MG) BY MOUTH DAILY Elouise Munroe, MD  Active   fenofibrate (TRICOR) 48 MG tablet [id number]  TAKE 1 TABLET(48 MG) BY MOUTH DAILY Fenton Foy, NP  Active   fluconazole (DIFLUCAN) 150 MG tablet [id number]  Take 1 tablet (150 mg total) by mouth daily. Vevelyn Francois, NP  Active   fluticasone [facility]) 50 MCG/ACT nasal spray [id number]  Place 2 sprays into both nostrils daily. Dorena Dew, FNP  Active Self  glucose blood (ACCU-CHEK GUIDE) test strip [id number]  USE AS DIRECTED FOUR TIMES DAILY. Elouise Munroe, MD  Active   hydrochlorothiazide (HYDRODIURIL) 25 MG tablet [id number] Yes Take 25 mg by mouth daily. [provider] Taking Active   loratadine (CLARITIN) 10 MG tablet [id number]  Take 1 tablet (10 mg total) by mouth daily. Dorena Dew, FNP  Active Self  metFORMIN (GLUCOPHAGE-XR) 500 MG 24 hr tablet [id number] Yes Take 1 tablet (500 mg total) by mouth in the morning and at bedtime. Elouise Munroe, MD Taking Active   montelukast (SINGULAIR) 10 MG tablet [id number]  Take 1 tablet (10 mg total) by mouth daily. Vevelyn Francois, NP  Active   Multiple Vitamins-Minerals (MULTIVITAMIN WITH MINERALS) tablet [id number]  Take 1 tablet by mouth daily. [provider]  Active Self  nystatin cream (MYCOSTATIN) [id number]  Apply 1 application. topically 2 (two) times daily. Fenton Foy, NP  Active   omega-3 acid ethyl esters (LOVAZA) 1 g capsule [id number]  TAKE 1 CAPSULE BY MOUTH EVERY DAY Fenton Foy, NP  Active   ondansetron (ZOFRAN) 4 MG tablet [id number]  Take 1 tablet (4 mg total) by mouth every 8 (eight) hours as needed for nausea or vomiting. Fenton Foy, NP  Active   PROAIR HFA 108 [phone] Base) MCG/ACT inhaler [id number]  INHALE 2 PUFFS INTO THE LUNGS EVERY 4 HOURS AS NEEDED FOR WHEEZING OR SHORTNESS OF BREATH Azzie Glatter, FNP   Active   Probiotic Product (PROBIOTIC-10 PO) [id number]  Take by mouth. [provider]  Active   rosuvastatin (CRESTOR) 10 MG tablet [id number]  Take 1 tablet (10 mg total) by mouth daily. Fenton Foy, NP  Active   Semaglutide,0.25 or 0.5MG /DOS, (OZEMPIC, 0.25 OR 0.5 MG/DOSE,) 2 MG/3ML SOPN [id number] Yes Inject 0.25mg  once weekly for 4 weeks then increase to 0.5mg  once weekly Elouise Munroe, MD Taking Active   valACYclovir (VALTREX) 1000 MG tablet [id number]  Take 1 tablet (1,000 mg total) by mouth 2 (two) times daily. Vevelyn Francois, NP  Active               Assessment/Plan:   Diabetes: - Currently uncontrolled but anticipate improvement - Reviewed goal A1c, goal fasting, and goal 2 hour post prandial glucose - Discussed that given nausea, I would recommend continuing Ozempic 0.5 mg weekly for another month. She was provided a refill that is at the pharmacy. After  that, would consider increasing to 1 mg weekly if fastings remain elevated. Will collaborate with [facility] pharmacist and PCP.  - Recommend to continue current regimen at this time   Hypertension: - Currently controlled - Reviewed appropriate blood pressure monitoring technique and reviewed goal blood pressure. Recommended to check home blood pressure and heart rate daily - Recommend to continue current regimen at this time   Hyperlipidemia/ASCVD Risk Reduction: - Currently uncontrolled but anticipate improvement.  - Recommend to continue current regimen along with cardiology collaboration  Follow Up Plan: phone call in ~2-4 weeks  Catie Hedwig Morton, PharmD, [place] [phone]

## 2022-02-17 NOTE — Patient Instructions (Signed)
Sharri,   It was great talking to you!  Your blood pressures are fantastic. Let's continue using Vivify for another 2 weeks and we'll talk to [facility] about stopping if your blood pressures remain at goal.   I advise you to continue Ozempic at 0.5 mg weekly for at least 4 weeks, which will require you to refill your current dose. After that, we can talk to [place] or Gerald Stabs about increasing to the 1 mg dose. The nausea is generally worse when first increasing the dose, so I anticipate it will improve over time.   Let us know if you have any questions or concerns. Thanks!  Catie Hedwig Morton, PharmD, [facility] [phone]

## 2022-02-25 ENCOUNTER — Telehealth: Payer: Self-pay

## 2022-02-25 DIAGNOSIS — I10 Essential (primary) hypertension: Secondary | ICD-10-CM | POA: Diagnosis not present

## 2022-02-25 DIAGNOSIS — B009 Herpesviral infection, unspecified: Secondary | ICD-10-CM

## 2022-02-25 MED ORDER — VALACYCLOVIR HCL 1 G PO TABS: 1000.0000 mg | ORAL_TABLET | Freq: Two times a day (BID) | ORAL | 0 refills | Status: DC

## 2022-02-25 NOTE — Telephone Encounter (Signed)
No additional notes needed  

## 2022-03-07 ENCOUNTER — Telehealth: Payer: Self-pay | Admitting: Pharmacist

## 2022-03-07 MED ORDER — SEMAGLUTIDE (1 MG/DOSE) 4 MG/3ML ~~LOC~~ SOPN: 1.0000 mg | PEN_INJECTOR | SUBCUTANEOUS | 1 refills | Status: DC

## 2022-03-07 NOTE — Telephone Encounter (Signed)
Received refill request for Ozempic 0.'5mg'$ . Called pt to see how she was doing and if she was ready to increase to '1mg'$ . Patient states she is ready to increase. Will send Rx for ozmepic '1mg'$  to pharmacy.

## 2022-03-08 ENCOUNTER — Other Ambulatory Visit: Payer: Self-pay

## 2022-03-08 VITALS — BP 120/75 | HR 85

## 2022-03-08 DIAGNOSIS — I10 Essential (primary) hypertension: Secondary | ICD-10-CM

## 2022-03-08 MED ORDER — BLOOD PRESSURE MONITOR DEVI: 0 refills | Status: AC

## 2022-03-08 NOTE — Patient Instructions (Addendum)
Oliver,   It was great talking to you!  Call the office to schedule a follow up appointment with [facility] on your next day off.  We sent a prescription for a blood pressure machine to [facility] (([phone]; [address] [place], [place] [address]). You can pick this up on Saturday- they are open 10 am to 1 pm.   Keep taking hydrochlorothiazide daily for blood pressure.   Check your blood pressure periodically, and any time you have concerning symptoms like headache, chest pain, dizziness, shortness of breath, or vision changes.   Our goal is less than 130/80.  To appropriately check your blood pressure, make sure you do the following:  1) Avoid caffeine, exercise, or tobacco products for 30 minutes before checking. Empty your bladder. 2) Sit with your back supported in a flat-backed chair. Rest your arm on something flat (arm of the chair, table, etc). 3) Sit still with your feet flat on the floor, resting, for at least 5 minutes.  4) Check your blood pressure. Take 1-2 readings.  5) Write down these readings and bring with you to any provider appointments.  Bring your home blood pressure machine with you to a provider's office for accuracy comparison at least once a year.   Make sure you take your blood pressure medications before you come to any office visit, even if you were asked to fast for labs.  Increase Ozempic to 1 mg as instructed by the cardiology team. Increasing the dose may cause stomach upset, queasiness, or constipation, especially when first starting. This generally improves over time. Call our office if these symptoms occur and worsen, or if you have severe symptoms such as vomiting, diarrhea, or stomach pain.   Take care!  Catie Hedwig Morton, PharmD, [facility] [phone]

## 2022-03-08 NOTE — Chronic Care Management (AMB) (Signed)
Chief Complaint  Patient presents with   Hypertension    Jessica Lawson is a 56 y.o. year old female who presented for a telephone visit.   They were referred to the pharmacist by their PCP for assistance in managing hypertension.   Patient is participating in a Managed Medicaid Plan:  Yes  Subjective:  Care Team: Primary Care Provider: Fenton Foy, NP ; Next Scheduled Visit: needs to schedule Cardiologist: Margaretann Loveless; Next Scheduled Visit: 06/29/22  Medication Access/Adherence  Current Pharmacy:  [facility] [place], [address] [place] [place] [address] Phone: [phone] Fax: [phone]   Patient reports affordability concerns with their medications: No  Patient reports access/transportation concerns to their pharmacy: No  Patient reports adherence concerns with their medications:  No     Diabetes:  Current medications: metformin XR 500 mg twice daily, Ozempic 0.5 mg - plans to increase to 1 mg this weekend  Current glucose readings: highest is 140 (after meals); generally <130 fasting. Reports she can tell that the days she has bigger suppers, her next day fasting readings are a bit higher   Reports that she doesn't often have a full "meal" while at work.   Hypertension:  Current medications: HCTZ 25 mg daily  Patient has a validated, automated, upper arm home BP cuff through the [facility] program. Does not have a separate home meter.  Current blood pressure readings readings:     Patient denies hypotensive s/sx including dizziness, lightheadedness.    Hyperlipidemia/ASCVD Risk Reduction  Current lipid lowering medications: rosuvastatin 10 mg daily, ezetimibe 10 mg daily, omega 3 fatty acids 1 capsule daily, fenofibrate 48 mg daily  Asks if Ozempic has an impact on lipids   Health Maintenance  Health Maintenance Due  Topic Date Due   PAP SMEAR-Modifier   10/30/2015   Zoster Vaccines- Shingrix (1 of 2) Never done   COVID-19 Vaccine (3 - Pfizer series) 04/24/2020   URINE MICROALBUMIN  10/16/2021   OPHTHALMOLOGY EXAM  10/28/2021     Objective: Lab Results  Component Value Date   HGBA1C 8.6 (A) 12/16/2021   HGBA1C 8.6 12/16/2021   HGBA1C 8.6 (A) 12/16/2021   HGBA1C 8.6 (A) 12/16/2021    Lab Results  Component Value Date   CREATININE 0.88 09/10/2021   BUN 14 09/10/2021   NA 140 09/10/2021   K 4.0 09/10/2021   CL 98 09/10/2021   CO2 25 06/04/2019    Lab Results  Component Value Date   CHOL 174 09/10/2021   HDL 38 (L) 09/10/2021   LDLCALC 102 (H) 09/10/2021   TRIG 194 (H) 09/10/2021   CHOLHDL 4.6 (H) 09/10/2021    Medications Reviewed Today     Reviewed by Osker Mason, RPH-CPP (Pharmacist) on 02/17/22 at 1644  Med List Status: <None>   Medication Order Taking? Sig Documenting Provider Last Dose Status Informant  Accu-Chek Softclix Lancets lancets [id number]  USE AS DIRECTED FOUR TIMES DAILY. Vevelyn Francois, NP  Active   ANEFRIN NASAL SPRAY 0.05 % nasal spray [id number]  Place into both nostrils. [provider]  Active   ascorbic acid (VITAMIN C) 1000 MG tablet [id number]  Take 2,000 mg by mouth daily. [provider]  Active   blood glucose meter kit and supplies KIT [id number]  Dispense based on patient and insurance preference. Use up to four times daily as directed. (FOR ICD-9 250.00, 250.01). Nils Pyle,  Kriste Basque, NP  Active   Blood Glucose Monitoring Suppl (ACCU-CHEK GUIDE) w/Device KIT [id number]  Use to check your blood sugar daily Elouise Munroe, MD  Active   cetirizine (ZYRTEC) 10 MG tablet [id number]  TAKE 1 TABLET(10 MG) BY MOUTH DAILY Vevelyn Francois, NP  Active   Cholecalciferol (VITAMIN D3) 50 MCG (2000 UT) TABS [id number]  Take 1 tablet by mouth daily. [provider]  Active   ciclopirox (LOPROX) 0.77 % cream [id number]  Apply topically 2 (two) times daily. Raspet, Derry Skill, PA-C   Active   ezetimibe (ZETIA) 10 MG tablet [id number]  TAKE 1 TABLET(10 MG) BY MOUTH DAILY Elouise Munroe, MD  Active   fenofibrate (TRICOR) 48 MG tablet [id number]  TAKE 1 TABLET(48 MG) BY MOUTH DAILY Fenton Foy, NP  Active   fluconazole (DIFLUCAN) 150 MG tablet [id number]  Take 1 tablet (150 mg total) by mouth daily. Vevelyn Francois, NP  Active   fluticasone [facility]) 50 MCG/ACT nasal spray [id number]  Place 2 sprays into both nostrils daily. Dorena Dew, FNP  Active Self  glucose blood (ACCU-CHEK GUIDE) test strip [id number]  USE AS DIRECTED FOUR TIMES DAILY. Elouise Munroe, MD  Active   hydrochlorothiazide (HYDRODIURIL) 25 MG tablet [id number] Yes Take 25 mg by mouth daily. [provider] Taking Active   loratadine (CLARITIN) 10 MG tablet [id number]  Take 1 tablet (10 mg total) by mouth daily. Dorena Dew, FNP  Active Self  metFORMIN (GLUCOPHAGE-XR) 500 MG 24 hr tablet [id number] Yes Take 1 tablet (500 mg total) by mouth in the morning and at bedtime. Elouise Munroe, MD Taking Active   montelukast (SINGULAIR) 10 MG tablet [id number]  Take 1 tablet (10 mg total) by mouth daily. Vevelyn Francois, NP  Active   Multiple Vitamins-Minerals (MULTIVITAMIN WITH MINERALS) tablet [id number]  Take 1 tablet by mouth daily. [provider]  Active Self  nystatin cream (MYCOSTATIN) [id number]  Apply 1 application. topically 2 (two) times daily. Fenton Foy, NP  Active   omega-3 acid ethyl esters (LOVAZA) 1 g capsule [id number]  TAKE 1 CAPSULE BY MOUTH EVERY DAY Fenton Foy, NP  Active   ondansetron (ZOFRAN) 4 MG tablet [id number]  Take 1 tablet (4 mg total) by mouth every 8 (eight) hours as needed for nausea or vomiting. Fenton Foy, NP  Active   PROAIR HFA 108 [phone] Base) MCG/ACT inhaler [id number]  INHALE 2 PUFFS INTO THE LUNGS EVERY 4 HOURS AS NEEDED FOR WHEEZING OR SHORTNESS OF BREATH Azzie Glatter, FNP  Active   Probiotic Product (PROBIOTIC-10 PO) [id number]   Take by mouth. [provider]  Active   rosuvastatin (CRESTOR) 10 MG tablet [id number]  Take 1 tablet (10 mg total) by mouth daily. Fenton Foy, NP  Active   Semaglutide,0.25 or 0.5MG/DOS, (OZEMPIC, 0.25 OR 0.5 MG/DOSE,) 2 MG/3ML SOPN [id number] Yes Inject 0.16m once weekly for 4 weeks then increase to 0.555monce weekly AcElouise MunroeMD Taking Active   valACYclovir (VALTREX) 1000 MG tablet [id number] 1 tablet (1,000 mg total) by mouth 2 (two) times daily. KiVevelyn FrancoisNP  Active               Assessment/Plan:   Diabetes: - Currently uncontrolled but improving  - Reviewed goal A1c, goal fasting, and goal 2 hour post prandial glucose - Reviewed dietary modifications including: focusing on proteins, fruits and vegetables, whole grains, low sugars. Discussed that small  protein meals throughout the day can be helpful if future GLP1-associated GI side effects as she increases dose to 1 mg.  - Recommend to continue current regimen at this time   Hypertension: - Currently controlled - Reviewed long term cardiovascular and renal outcomes of uncontrolled blood pressure - Reviewed appropriate blood pressure monitoring technique and reviewed goal blood pressure. Recommended to check home blood pressure and heart rate daily. Recommend to end [place]. Will send script to [facility] for BP machine - patient can pick up on Saturday.  - Recommend to continue current regimen at this time  Hyperlipidemia/ASCVD Risk Reduction: - Currently uncontrolled, though anticipated to improve with dietary/weight loss changes - Recommend to continue current regimen. Recommend lipid panel with next PCP visit.    Follow Up Plan: phone call in ~ 4 weeks  Catie TJodi Mourning, PharmD, [facility] [phone]

## 2022-03-11 ENCOUNTER — Telehealth: Payer: Self-pay

## 2022-03-11 DIAGNOSIS — E119 Type 2 diabetes mellitus without complications: Secondary | ICD-10-CM

## 2022-03-11 MED ORDER — METFORMIN HCL ER 500 MG PO TB24: 500.0000 mg | ORAL_TABLET | Freq: Two times a day (BID) | ORAL | 5 refills | Status: DC

## 2022-03-11 NOTE — Telephone Encounter (Signed)
No additional notes needed  

## 2022-03-12 ENCOUNTER — Encounter (HOSPITAL_COMMUNITY): Payer: Self-pay

## 2022-03-12 ENCOUNTER — Ambulatory Visit (HOSPITAL_COMMUNITY)
Admission: EM | Admit: 2022-03-12 | Discharge: 2022-03-12 | Disposition: A | Discharge status: Home / Self Care | Payer: Medicaid Other | Attending: Physician Assistant | Admitting: Physician Assistant

## 2022-03-12 DIAGNOSIS — H6121 Impacted cerumen, right ear: Secondary | ICD-10-CM | POA: Diagnosis not present

## 2022-03-12 DIAGNOSIS — H60501 Unspecified acute noninfective otitis externa, right ear: Secondary | ICD-10-CM

## 2022-03-12 MED ORDER — CIPROFLOXACIN-DEXAMETHASONE 0.3-0.1 % OT SUSP: 4.0000 [drp] | Freq: Two times a day (BID) | OTIC | 0 refills | Status: AC

## 2022-03-12 NOTE — ED Provider Notes (Signed)
[place]    CSN: [id number] Arrival date & time: 03/12/22  1646      History   Chief Complaint Chief Complaint  Patient presents with   Ear Fullness    HPI Jessica Lawson is a 56 y.o. female.   Patient presents today with a several day history of fullness that is worse on the right side.  She reports a feeling of itching/something moving in her ear but denies any significant otalgia.  Denies any recent URI or other symptoms including cough, congestion, fever, nausea, vomiting.  She has not tried any over-the-counter medication for symptom management.  She does have seasonal allergies and uses cetirizine daily.  She is prescribed Flonase but uses only as needed; has not been using this recently.  She denies history of cerumen impaction or episodes of similar symptoms in the past.  Denies any recent swimming, airplane travel, barotrauma.    Past Medical History:  Diagnosis Date   Allergy    Back pain    Diabetes mellitus without complication (HCC)    High blood cholesterol    Hypertension    Kidney stone    Normal cardiac stress test 02/2016   low risk   Obesity    Pain of cheek    and right side neck   Tobacco use    UTI (lower urinary tract infection)    Wrist pain    left wrist    Patient Active Problem List   Diagnosis Date Noted   Type 2 diabetes mellitus without complication, without long-term current use of insulin ([place]) 12/17/2021   Hyperglycemia 09/18/2019   Wrist strain, left, initial encounter 09/18/2019   Essential hypertension 11/01/2017   Obesity (BMI 30-39.9) 11/01/2017   Herpes simplex type 1 infection 12/15/2016   Chest pain 03/05/2016   Acute bronchitis 03/05/2016   Environmental allergies 10/26/2015   Hypercholesterolemia with hypertriglyceridemia 09/20/2015   Hyperlipidemia 09/05/2015   Right shoulder pain 09/05/2015   Mouth ulcers 09/05/2015    Past Surgical History:  Procedure Laterality Date   CESAREAN SECTION     3  c-sections   DENTAL SURGERY     teeth removed with meds to calm     OB History     Gravida  3   Para      Term      Preterm      AB      Living  3      SAB      IAB      Ectopic      Multiple      Live Births               Home Medications    Prior to Admission medications   Medication Sig Start Date End Date Taking? Authorizing Provider  ciprofloxacin-dexamethasone (CIPRODEX) OTIC suspension Place 4 drops into the right ear 2 (two) times daily. 03/12/22  Yes Colbi Schiltz K, PA-C  Accu-Chek Softclix Lancets lancets USE AS DIRECTED FOUR TIMES DAILY. 02/03/20   Vevelyn Francois, NP  ANEFRIN NASAL SPRAY 0.05 % nasal spray Place into both nostrils. 02/11/21   [provider]  ascorbic acid (VITAMIN C) 1000 MG tablet Take 2,000 mg by mouth daily. 02/11/21   [provider]  blood glucose meter kit and supplies KIT Dispense based on patient and insurance preference. Use up to four times daily as directed. (FOR ICD-9 250.00, 250.01). 12/16/21   Fenton Foy, NP  Blood Glucose Monitoring  Suppl (ACCU-CHEK GUIDE) w/Device KIT Use to check your blood sugar daily 12/31/21   Elouise Munroe, MD  Blood Pressure Monitor DEVI Use to check blood pressure daily. 03/08/22   Fenton Foy, NP  cetirizine (ZYRTEC) 10 MG tablet TAKE 1 TABLET(10 MG) BY MOUTH DAILY 03/10/21   Vevelyn Francois, NP  Cholecalciferol (VITAMIN D3) 50 MCG (2000 UT) TABS Take 1 tablet by mouth daily. 02/11/21   [provider]  ciclopirox (LOPROX) 0.77 % cream Apply topically 2 (two) times daily. 01/14/22   Drucella Karbowski, Derry Skill, PA-C  ezetimibe (ZETIA) 10 MG tablet TAKE 1 TABLET(10 MG) BY MOUTH DAILY 03/24/21   Elouise Munroe, MD  fenofibrate (TRICOR) 48 MG tablet TAKE 1 TABLET(48 MG) BY MOUTH DAILY 01/07/22   Fenton Foy, NP  fluconazole (DIFLUCAN) 150 MG tablet Take 1 tablet (150 mg total) by mouth daily. 10/13/21   Vevelyn Francois, NP  fluticasone (FLONASE) 50 MCG/ACT nasal spray Place  2 sprays into both nostrils daily. 01/29/18   Dorena Dew, FNP  glucose blood (ACCU-CHEK GUIDE) test strip USE AS DIRECTED FOUR TIMES DAILY. 12/31/21   Elouise Munroe, MD  hydrochlorothiazide (HYDRODIURIL) 25 MG tablet Take 25 mg by mouth daily. 12/06/21   [provider]  loratadine (CLARITIN) 10 MG tablet Take 1 tablet (10 mg total) by mouth daily. 01/29/18   Dorena Dew, FNP  metFORMIN (GLUCOPHAGE-XR) 500 MG 24 hr tablet Take 1 tablet (500 mg total) by mouth in the morning and at bedtime. 03/11/22   Fenton Foy, NP  montelukast (SINGULAIR) 10 MG tablet Take 1 tablet (10 mg total) by mouth daily. 05/31/21 05/31/22  Vevelyn Francois, NP  Multiple Vitamins-Minerals (MULTIVITAMIN WITH MINERALS) tablet Take 1 tablet by mouth daily.    [provider]  nystatin cream (MYCOSTATIN) Apply 1 application. topically 2 (two) times daily. 01/14/22   Fenton Foy, NP  omega-3 acid ethyl esters (LOVAZA) 1 g capsule TAKE 1 CAPSULE BY MOUTH EVERY DAY 12/16/21   Fenton Foy, NP  ondansetron (ZOFRAN) 4 MG tablet Take 1 tablet (4 mg total) by mouth every 8 (eight) hours as needed for nausea or vomiting. 01/05/22   Fenton Foy, NP  PROAIR HFA 108 [phone] Base) MCG/ACT inhaler INHALE 2 PUFFS INTO THE LUNGS EVERY 4 HOURS AS NEEDED FOR WHEEZING OR SHORTNESS OF BREATH 10/25/19   Azzie Glatter, FNP  Probiotic Product (PROBIOTIC-10 PO) Take by mouth.    [provider]  rosuvastatin (CRESTOR) 10 MG tablet Take 1 tablet (10 mg total) by mouth daily. 02/01/22   Fenton Foy, NP  Semaglutide, 1 MG/DOSE, 4 MG/3ML SOPN Inject 1 mg into the skin once a week. 03/07/22   Elouise Munroe, MD  valACYclovir (VALTREX) 1000 MG tablet Take 1 tablet (1,000 mg total) by mouth 2 (two) times daily. 02/25/22   Fenton Foy, NP    Family History Family History  Problem Relation Age of Onset   Hyperlipidemia Other    Hypertension Other    Diabetes Other    Diabetes Mother    Lung  disease Father    Diabetes Sister    Diabetes Sister    Hypertension Sister    Colon cancer Neg Hx    Colon polyps Neg Hx    Esophageal cancer Neg Hx    Stomach cancer Neg Hx    Rectal cancer Neg Hx     Social History Social History   Tobacco  Use   Smoking status: Former    Packs/day: 1.00    Types: Cigarettes    Quit date: 07/26/2016    Years since quitting: 5.6   Smokeless tobacco: Never  Vaping Use   Vaping Use: Never used  Substance Use Topics   Alcohol use: No   Drug use: No     Allergies   Asa [aspirin], Iohexol, Penicillins, Sulfa antibiotics, and Nystatin   Review of Systems Review of Systems  Constitutional:  Negative for activity change, appetite change, fatigue and fever.  HENT:  Negative for congestion, ear discharge, ear pain, sinus pressure, sneezing and sore throat.   Respiratory:  Negative for cough and shortness of breath.   Cardiovascular:  Negative for chest pain.  Gastrointestinal:  Negative for abdominal pain, diarrhea, nausea and vomiting.  Neurological:  Negative for dizziness, light-headedness and headaches.     Physical Exam Triage Vital Signs ED Triage Vitals  Enc Vitals Group     BP 03/12/22 1722 (!) 149/86     Pulse Rate 03/12/22 1722 88     Resp 03/12/22 1722 16     Temp 03/12/22 1722 98.2 F (36.8 C)     Temp src --      SpO2 03/12/22 1722 99 %     Weight --      Height --      Head Circumference --      Peak Flow --      Pain Score 03/12/22 1724 0     Pain Loc --      Pain Edu? --      Excl. in [place]? --    No data found.  Updated Vital Signs BP (!) 149/86 (BP Location: Left Arm)   Pulse 88   Temp 98.2 F (36.8 C)   Resp 16   LMP 08/22/2011 Comment: patient states LMP 20 years ago  SpO2 99%   Visual Acuity Right Eye Distance:   Left Eye Distance:   Bilateral Distance:    Right Eye Near:   Left Eye Near:    Bilateral Near:     Physical Exam Vitals reviewed.  Constitutional:      General: She is awake.  She is not in acute distress.    Appearance: Normal appearance. She is well-developed. She is not ill-appearing.     Comments: Very pleasant female appears stated age in no acute distress sitting comfortably in exam room  HENT:     Head: Normocephalic and atraumatic.     Right Ear: Tympanic membrane, ear canal and external ear normal. There is impacted cerumen.     Left Ear: Tympanic membrane, ear canal and external ear normal. Tympanic membrane is not erythematous or bulging.     Ears:     Comments: Right ear: Cerumen impaction noted.  Unable to visualize TM.  Resolved with in office irrigation revealing erythematous and edematous ear canal but normal-appearing TM.    Nose:     Right Sinus: No maxillary sinus tenderness or frontal sinus tenderness.     Left Sinus: No maxillary sinus tenderness or frontal sinus tenderness.     Mouth/Throat:     Pharynx: Uvula midline. No oropharyngeal exudate or posterior oropharyngeal erythema.  Cardiovascular:     Rate and Rhythm: Normal rate and regular rhythm.     Heart sounds: Normal heart sounds, S1 normal and S2 normal. No murmur heard. Pulmonary:     Effort: Pulmonary effort is normal.     Breath sounds: Normal  breath sounds. No wheezing, rhonchi or rales.     Comments: Clear to auscultation bilaterally Lymphadenopathy:     Head:     Right side of head: No submental, submandibular or tonsillar adenopathy.     Left side of head: No submental, submandibular or tonsillar adenopathy.     Cervical: No cervical adenopathy.  Psychiatric:        Behavior: Behavior is cooperative.      UC Treatments / Results  Labs (all labs ordered are listed, but only abnormal results are displayed) Labs Reviewed - No data to display  EKG   Radiology No results found.  Procedures Procedures (including critical care time)  Medications Ordered in UC Medications - No data to display  Initial Impression / Assessment and Plan / UC Course  I have reviewed  the triage vital signs and the nursing notes.  Pertinent labs & imaging results that were available during my care of the patient were reviewed by me and considered in my medical decision making (see chart for details).     Cerumen impaction noted on exam.  History was resolved with in office irrigation.  Ear canal was erythematous and edematous with normal-appearing TM.  Given these findings will cover with Ciprodex twice daily.  Discussed that she should keep her ear facing upward for several minutes after applying medication to allow complete penetration throughout ear canal.  She is to avoid earbuds, earplugs, Q-tips until symptoms resolve.  Recommend she does not submerge her head in water until symptoms resolve.  If symptoms persist or worsen she is to return for reevaluation.  Strict return precautions given.  Final Clinical Impressions(s) / UC Diagnoses   Final diagnoses:  Impacted cerumen of right ear  Acute otitis externa of right ear, unspecified type     Discharge Instructions      We remove the wax from your canal.  It did look very irritated which is likely because of this washing it out.  Use eardrops to help prevent infection and provide some relief.  This should be used twice a day.  Keep ear facing upward for several minutes after applying medication.  Do not put earbuds, earplugs, Q-tips in your ears.  Do not submerge your head in water until symptoms improve.  If anything worsens you need to return for reevaluation.     ED Prescriptions     Medication Sig Dispense Auth. Provider   ciprofloxacin-dexamethasone (CIPRODEX) OTIC suspension Place 4 drops into the right ear 2 (two) times daily. 7.5 mL Rogue Pautler K, PA-C      PDMP not reviewed this encounter.   Terrilee Croak, PA-C 03/12/22 1822

## 2022-03-12 NOTE — Discharge Instructions (Signed)
We remove the wax from your canal.  It did look very irritated which is likely because of this washing it out.  Use eardrops to help prevent infection and provide some relief.  This should be used twice a day.  Keep ear facing upward for several minutes after applying medication.  Do not put earbuds, earplugs, Q-tips in your ears.  Do not submerge your head in water until symptoms improve.  If anything worsens you need to return for reevaluation.

## 2022-03-12 NOTE — ED Triage Notes (Signed)
Pt states fullness to both ears worse on the right side for the past 2 days.

## 2022-03-14 DIAGNOSIS — I10 Essential (primary) hypertension: Secondary | ICD-10-CM | POA: Diagnosis not present

## 2022-04-08 ENCOUNTER — Encounter: Payer: Self-pay | Admitting: Nurse Practitioner

## 2022-04-08 ENCOUNTER — Encounter: Payer: Self-pay | Admitting: Pharmacist

## 2022-04-08 ENCOUNTER — Ambulatory Visit: Payer: Medicaid Other | Admitting: Nurse Practitioner

## 2022-04-08 VITALS — BP 137/87 | HR 73 | Temp 97.7°F | Ht 59.0 in | Wt 179.2 lb

## 2022-04-08 DIAGNOSIS — E119 Type 2 diabetes mellitus without complications: Secondary | ICD-10-CM

## 2022-04-08 LAB — POCT GLYCOSYLATED HEMOGLOBIN (HGB A1C)
HbA1c POC (<> result, manual entry): 6.6 % (ref 4.0–5.6)
HbA1c, POC (controlled diabetic range): 6.6 % (ref 0.0–7.0)
HbA1c, POC (prediabetic range): 6.6 % — AB (ref 5.7–6.4)
Hemoglobin A1C: 6.6 % — AB (ref 4.0–5.6)

## 2022-04-08 NOTE — Progress Notes (Signed)
_0  ID: Jessica Lawson, female    DOB: 1966/03/24, 56 y.o.   MRN: [id number]  Chief Complaint  Patient presents with   Hypertension    Pt is here for 3 month's follow up visit.    Referring provider: Fenton Foy, NP   HPI  Jessica Lawson presents for follow up. She  has a past medical history of Allergy, Back pain, Diabetes mellitus without complication ([place]), High blood cholesterol, Hypertension, Kidney stone, Normal cardiac stress test (02/2016), Obesity, Pain of cheek, Tobacco use, UTI (lower urinary tract infection), and Wrist pain.   56 y.o. female for follow up of diabetes. Diabetic Review of Systems - medication compliance: compliant all of the time, diabetic diet compliance: compliant most of the time, home glucose monitoring: is performed regularly, further diabetic ROS: no polyuria or polydipsia, no chest pain, dyspnea or TIA's, no numbness, tingling or pain in extremities, last eye exam approximately 2 years ago.  Other symptoms and concerns: Patient has been having hypoglycemic. Denies f/c/s, n/v/d, hemoptysis, PND, leg swelling Denies chest pain or edema     Allergies  Allergen Reactions   Asa [Aspirin] Anaphylaxis, Hives and Swelling   Iohexol Other (See Comments)     Desc: pt complains of difficulty swallowing and thickened tongue/ throat closes up    Penicillins Hives, Swelling and Other (See Comments)    Thrush    Sulfa Antibiotics Anaphylaxis, Hives, Swelling and Other (See Comments)   Nystatin Other (See Comments)    thrush    Immunization History  Administered Date(s) Administered   Influenza,inj,Quad PF,6+ Mos 05/22/2020   MMRV 06/18/2020   PFIZER(Purple Top)SARS-COV-2 Vaccination 02/07/2020, 02/28/2020   Pneumococcal Polysaccharide-23 09/03/2015   Tdap 11/01/2018    Past Medical History:  Diagnosis Date   Allergy    Back pain    Diabetes mellitus without complication (HCC)    High blood cholesterol    Hypertension    Kidney stone     Normal cardiac stress test 02/2016   low risk   Obesity    Pain of cheek    and right side neck   Tobacco use    UTI (lower urinary tract infection)    Wrist pain    left wrist    Tobacco History: Social History   Tobacco Use  Smoking Status Former   Packs/day: 1.00   Types: Cigarettes   Quit date: 07/26/2016   Years since quitting: 5.7  Smokeless Tobacco Never   Counseling given: Not Answered   Outpatient Encounter Medications as of 04/08/2022  Medication Sig   Accu-Chek Softclix Lancets lancets USE AS DIRECTED FOUR TIMES DAILY.   ANEFRIN NASAL SPRAY 0.05 % nasal spray Place into both nostrils.   Blood Glucose Monitoring Suppl (ACCU-CHEK GUIDE) w/Device KIT Use to check your blood sugar daily   Blood Pressure Monitor DEVI Use to check blood pressure daily.   cetirizine (ZYRTEC) 10 MG tablet TAKE 1 TABLET(10 MG) BY MOUTH DAILY   Cholecalciferol (VITAMIN D3) 50 MCG (2000 UT) TABS Take 1 tablet by mouth daily.   ciclopirox (LOPROX) 0.77 % cream Apply topically 2 (two) times daily.   ciprofloxacin-dexamethasone (CIPRODEX) OTIC suspension Place 4 drops into the right ear 2 (two) times daily.   ezetimibe (ZETIA) 10 MG tablet TAKE 1 TABLET(10 MG) BY MOUTH DAILY   fenofibrate (TRICOR) 48 MG tablet TAKE 1 TABLET(48 MG) BY MOUTH DAILY   fluticasone (FLONASE) 50 MCG/ACT nasal spray Place 2 sprays into both nostrils daily.   glucose blood (  ACCU-CHEK GUIDE) test strip USE AS DIRECTED FOUR TIMES DAILY.   hydrochlorothiazide (HYDRODIURIL) 25 MG tablet Take 25 mg by mouth daily.   loratadine (CLARITIN) 10 MG tablet Take 1 tablet (10 mg total) by mouth daily.   metFORMIN (GLUCOPHAGE-XR) 500 MG 24 hr tablet Take 1 tablet (500 mg total) by mouth in the morning and at bedtime.   Multiple Vitamins-Minerals (MULTIVITAMIN WITH MINERALS) tablet Take 1 tablet by mouth daily.   nystatin cream (MYCOSTATIN) Apply 1 application. topically 2 (two) times daily.   omega-3 acid ethyl esters (LOVAZA) 1 g  capsule TAKE 1 CAPSULE BY MOUTH EVERY DAY   ondansetron (ZOFRAN) 4 MG tablet Take 1 tablet (4 mg total) by mouth every 8 (eight) hours as needed for nausea or vomiting.   PROAIR HFA 108 (90 Base) MCG/ACT inhaler INHALE 2 PUFFS INTO THE LUNGS EVERY 4 HOURS AS NEEDED FOR WHEEZING OR SHORTNESS OF BREATH   Probiotic Product (PROBIOTIC-10 PO) Take by mouth.   rosuvastatin (CRESTOR) 10 MG tablet Take 1 tablet (10 mg total) by mouth daily.   Semaglutide, 1 MG/DOSE, 4 MG/3ML SOPN Inject 1 mg into the skin once a week.   valACYclovir (VALTREX) 1000 MG tablet Take 1 tablet (1,000 mg total) by mouth 2 (two) times daily.   ascorbic acid (VITAMIN C) 1000 MG tablet Take 2,000 mg by mouth daily. (Patient not taking: Reported on 04/08/2022)   blood glucose meter kit and supplies KIT Dispense based on patient and insurance preference. Use up to four times daily as directed. (FOR ICD-9 250.00, 250.01).   fluconazole (DIFLUCAN) 150 MG tablet Take 1 tablet (150 mg total) by mouth daily. (Patient not taking: Reported on 04/08/2022)   montelukast (SINGULAIR) 10 MG tablet Take 1 tablet (10 mg total) by mouth daily.   No facility-administered encounter medications on file as of 04/08/2022.     Review of Systems  Review of Systems  Constitutional: Negative.   HENT: Negative.    Cardiovascular: Negative.   Gastrointestinal: Negative.   Allergic/Immunologic: Negative.   Neurological: Negative.   Psychiatric/Behavioral: Negative.         Physical Exam  BP 137/87 (BP Location: Right Arm, Patient Position: Sitting, Cuff Size: Large)   Pulse 73   Temp 97.7 F (36.5 C)   Ht 4' 11" (1.499 m)   Wt 179 lb 4 oz (81.3 kg)   LMP 08/22/2011 Comment: patient states LMP 20 years ago  SpO2 (!) 73%   BMI 36.20 kg/m   Wt Readings from Last 5 Encounters:  04/08/22 179 lb 4 oz (81.3 kg)  02/17/22 183 lb 6.4 oz (83.2 kg)  12/31/21 188 lb 12.8 oz (85.6 kg)  12/23/21 187 lb 6.4 oz (85 kg)  12/16/21 188 lb (85.3 kg)      Physical Exam Vitals and nursing note reviewed.  Constitutional:      General: She is not in acute distress.    Appearance: She is well-developed.  Cardiovascular:     Rate and Rhythm: Normal rate and regular rhythm.  Pulmonary:     Effort: Pulmonary effort is normal.     Breath sounds: Normal breath sounds.  Neurological:     Mental Status: She is alert and oriented to person, place, and time.      Lab Results:  CBC    Component Value Date/Time   WBC 6.6 03/10/2021 1151   WBC 6.5 05/18/2018 0855   RBC 4.99 03/10/2021 1151   RBC 5.08 05/18/2018 0855   HGB 14.1  03/10/2021 1151   HCT 42.0 03/10/2021 1151   PLT 340 03/10/2021 1151   MCV 84 03/10/2021 1151   MCH 28.3 03/10/2021 1151   MCH 28.1 05/18/2018 0855   MCHC 33.6 03/10/2021 1151   MCHC 33.6 05/18/2018 0855   RDW 13.7 03/10/2021 1151   LYMPHSABS 3.9 (H) 03/10/2021 1151   MONOABS 520 12/15/2016 1230   EOSABS 0.1 03/10/2021 1151   BASOSABS 0.0 03/10/2021 1151    BMET    Component Value Date/Time   NA 140 09/10/2021 1530   K 4.0 09/10/2021 1530   CL 98 09/10/2021 1530   CO2 25 06/04/2019 1524   GLUCOSE 123 (H) 09/10/2021 1530   GLUCOSE 141 (H) 05/18/2018 0855   BUN 14 09/10/2021 1530   CREATININE 0.88 09/10/2021 1530   CREATININE 0.82 12/15/2016 1230   CALCIUM 10.4 (H) 09/10/2021 1530   GFRNONAA 77 03/16/2020 1047   GFRNONAA 84 12/15/2016 1230   GFRAA 89 03/16/2020 1047   GFRAA >89 12/15/2016 1230     Assessment & Plan:   Type 2 diabetes mellitus without complication, without long-term current use of insulin (HCC) - POCT glycosylated hemoglobin (Hb A1C) - CBC - Comprehensive metabolic panel  - may decrease metformin to once daily.   Follow up:  Follow up in 3 months or sooner     Fenton Foy, NP 04/08/2022

## 2022-04-08 NOTE — Assessment & Plan Note (Signed)
-   POCT glycosylated hemoglobin (Hb A1C) - CBC - Comprehensive metabolic panel  - may decrease metformin to once daily.   Follow up:  Follow up in 3 months or sooner

## 2022-04-08 NOTE — Patient Instructions (Signed)
1. Type 2 diabetes mellitus without complication, without long-term current use of insulin (HCC)  - POCT glycosylated hemoglobin (Hb A1C) - CBC - Comprehensive metabolic panel  - may decrease metformin to once daily.   Follow up:  Follow up in 3 months or sooner

## 2022-04-09 ENCOUNTER — Other Ambulatory Visit: Payer: Self-pay | Admitting: Internal Medicine

## 2022-04-09 LAB — CBC
Hematocrit: 41.8 % (ref 34.0–46.6)
Hemoglobin: 14.3 g/dL (ref 11.1–15.9)
MCH: 28.1 pg (ref 26.6–33.0)
MCHC: 34.2 g/dL (ref 31.5–35.7)
MCV: 82 fL (ref 79–97)
Platelets: 370 10*3/uL (ref 150–450)
RBC: 5.09 x10E6/uL (ref 3.77–5.28)
RDW: 13.5 % (ref 11.7–15.4)
WBC: 8.6 10*3/uL (ref 3.4–10.8)

## 2022-04-09 LAB — COMPREHENSIVE METABOLIC PANEL
ALT: 39 IU/L — ABNORMAL HIGH (ref 0–32)
AST: 26 IU/L (ref 0–40)
Albumin/Globulin Ratio: 1.6 (ref 1.2–2.2)
Albumin: 4.9 g/dL (ref 3.8–4.9)
Alkaline Phosphatase: 49 IU/L (ref 44–121)
BUN/Creatinine Ratio: 15 (ref 9–23)
BUN: 15 mg/dL (ref 6–24)
Bilirubin Total: 0.3 mg/dL (ref 0.0–1.2)
CO2: 24 mmol/L (ref 20–29)
Calcium: 10.4 mg/dL — ABNORMAL HIGH (ref 8.7–10.2)
Chloride: 99 mmol/L (ref 96–106)
Creatinine, Ser: 1.01 mg/dL — ABNORMAL HIGH (ref 0.57–1.00)
Globulin, Total: 3 g/dL (ref 1.5–4.5)
Glucose: 85 mg/dL (ref 70–99)
Potassium: 4.4 mmol/L (ref 3.5–5.2)
Sodium: 141 mmol/L (ref 134–144)
Total Protein: 7.9 g/dL (ref 6.0–8.5)
eGFR: 65 mL/min/{1.73_m2} (ref >59)

## 2022-04-12 ENCOUNTER — Other Ambulatory Visit: Payer: Self-pay | Admitting: Nurse Practitioner

## 2022-04-12 DIAGNOSIS — E785 Hyperlipidemia, unspecified: Secondary | ICD-10-CM

## 2022-04-13 ENCOUNTER — Other Ambulatory Visit: Payer: Medicaid Other | Admitting: Pharmacist

## 2022-04-13 ENCOUNTER — Ambulatory Visit: Payer: Medicaid Other

## 2022-04-19 ENCOUNTER — Other Ambulatory Visit: Payer: Self-pay | Admitting: Nurse Practitioner

## 2022-04-19 DIAGNOSIS — E785 Hyperlipidemia, unspecified: Secondary | ICD-10-CM

## 2022-04-20 ENCOUNTER — Encounter: Payer: Self-pay | Admitting: Internal Medicine

## 2022-04-20 MED ORDER — EZETIMIBE 10 MG PO TABS: ORAL_TABLET | ORAL | 2 refills | Status: DC

## 2022-04-21 ENCOUNTER — Telehealth: Payer: Self-pay

## 2022-04-21 DIAGNOSIS — J0100 Acute maxillary sinusitis, unspecified: Secondary | ICD-10-CM

## 2022-04-21 MED ORDER — HYDROCHLOROTHIAZIDE 25 MG PO TABS: 25.0000 mg | ORAL_TABLET | Freq: Every day | ORAL | 0 refills | Status: DC

## 2022-04-21 MED ORDER — CETIRIZINE HCL 10 MG PO TABS: ORAL_TABLET | ORAL | 3 refills | Status: DC

## 2022-04-21 NOTE — Telephone Encounter (Signed)
No additional notes needed  

## 2022-04-26 ENCOUNTER — Other Ambulatory Visit: Payer: Self-pay | Admitting: Internal Medicine

## 2022-04-29 ENCOUNTER — Other Ambulatory Visit: Payer: Self-pay | Admitting: Pharmacist

## 2022-05-05 ENCOUNTER — Other Ambulatory Visit: Payer: Self-pay | Admitting: Nurse Practitioner

## 2022-05-11 ENCOUNTER — Encounter: Payer: Self-pay | Admitting: Nurse Practitioner

## 2022-05-20 DIAGNOSIS — Z23 Encounter for immunization: Secondary | ICD-10-CM | POA: Diagnosis not present

## 2022-05-23 ENCOUNTER — Encounter: Payer: Self-pay | Admitting: Pharmacist

## 2022-05-24 ENCOUNTER — Encounter: Payer: Self-pay | Admitting: Pharmacist

## 2022-05-24 DIAGNOSIS — E119 Type 2 diabetes mellitus without complications: Secondary | ICD-10-CM

## 2022-05-24 MED ORDER — OZEMPIC (2 MG/DOSE) 8 MG/3ML ~~LOC~~ SOPN: 2.0000 mg | PEN_INJECTOR | SUBCUTANEOUS | 5 refills | Status: DC

## 2022-05-31 ENCOUNTER — Other Ambulatory Visit: Payer: Self-pay | Admitting: Nurse Practitioner

## 2022-06-17 ENCOUNTER — Encounter: Payer: Self-pay | Admitting: Nurse Practitioner

## 2022-06-17 ENCOUNTER — Ambulatory Visit: Payer: Medicaid Other | Admitting: Nurse Practitioner

## 2022-06-17 VITALS — BP 130/68 | HR 80 | Ht 59.0 in | Wt 181.0 lb

## 2022-06-17 DIAGNOSIS — R350 Frequency of micturition: Secondary | ICD-10-CM

## 2022-06-17 DIAGNOSIS — J069 Acute upper respiratory infection, unspecified: Secondary | ICD-10-CM

## 2022-06-17 LAB — POCT URINALYSIS DIP (CLINITEK)
Bilirubin, UA: NEGATIVE
Blood, UA: NEGATIVE
Glucose, UA: NEGATIVE mg/dL
Ketones, POC UA: NEGATIVE mg/dL
Nitrite, UA: NEGATIVE
POC PROTEIN,UA: NEGATIVE
Spec Grav, UA: 1.025 (ref 1.010–1.025)
Urobilinogen, UA: 2 E.U./dL — AB
pH, UA: 6.5 (ref 5.0–8.0)

## 2022-06-17 MED ORDER — DOXYCYCLINE HYCLATE 100 MG PO TABS: 100.0000 mg | ORAL_TABLET | Freq: Two times a day (BID) | ORAL | 0 refills | Status: AC

## 2022-06-17 NOTE — Patient Instructions (Signed)
1. Urinary frequency  - POCT URINALYSIS DIP (CLINITEK) - doxycycline (VIBRA-TABS) 100 MG tablet; Take 1 tablet (100 mg total) by mouth 2 (two) times daily for 10 days.  Dispense: 20 tablet; Refill: 0  2. Upper respiratory tract infection, unspecified type  - doxycycline (VIBRA-TABS) 100 MG tablet; Take 1 tablet (100 mg total) by mouth 2 (two) times daily for 10 days.  Dispense: 20 tablet; Refill: 0   Follow up:  Follow up as schedule

## 2022-06-17 NOTE — Progress Notes (Signed)
_0  ID: Jessica Lawson, female    DOB: 1966-02-11, 56 y.o.   MRN: [id number]  Chief Complaint  Patient presents with   Urinary Tract Infection    Pt complaining of lower back pain and urinary frequency started X2 days ago Pt also complaining of sinus congestion for X1 week     Referring provider: Fenton Foy, NP  HPI  Jessica Lawson presents for follow up. She  has a past medical history of Allergy, Back pain, Diabetes mellitus without complication (Universal), High blood cholesterol, Hypertension, Kidney stone, Normal cardiac stress test (02/2016), Obesity, Pain of cheek, Tobacco use, UTI (lower urinary tract infection), and Wrist pain.    Patient presents today for sick visit.  She states that she has been having head congestion, sinus pressure and pain for 1 week.  She denies any significant fever.  She states that she has also been having urinary frequency and right lower back pain for the past 3 days.  She has been drinking more water and cranberry juice. Denies f/c/s, n/v/d, hemoptysis, PND, leg swelling Denies chest pain or edema     Allergies  Allergen Reactions   Asa [Aspirin] Anaphylaxis, Hives and Swelling   Iohexol Other (See Comments)     Desc: pt complains of difficulty swallowing and thickened tongue/ throat closes up    Penicillins Hives, Swelling and Other (See Comments)    Thrush    Sulfa Antibiotics Anaphylaxis, Hives, Swelling and Other (See Comments)   Nystatin Other (See Comments)    thrush    Immunization History  Administered Date(s) Administered   Influenza,inj,Quad PF,6+ Mos 05/22/2020   MMRV 06/18/2020   PFIZER(Purple Top)SARS-COV-2 Vaccination 02/07/2020, 02/28/2020   Pneumococcal Polysaccharide-23 09/03/2015   Tdap 11/01/2018    Past Medical History:  Diagnosis Date   Allergy    Back pain    Diabetes mellitus without complication (HCC)    High blood cholesterol    Hypertension    Kidney stone    Normal cardiac stress test 02/2016    low risk   Obesity    Pain of cheek    and right side neck   Tobacco use    UTI (lower urinary tract infection)    Wrist pain    left wrist    Tobacco History: Social History   Tobacco Use  Smoking Status Former   Packs/day: 1.00   Types: Cigarettes   Quit date: 07/26/2016   Years since quitting: 5.8  Smokeless Tobacco Never   Counseling given: Not Answered   Outpatient Encounter Medications as of 06/17/2022  Medication Sig   Accu-Chek Softclix Lancets lancets USE AS DIRECTED FOUR TIMES DAILY.   ANEFRIN NASAL SPRAY 0.05 % nasal spray Place into both nostrils.   ascorbic acid (VITAMIN C) 1000 MG tablet Take 2,000 mg by mouth daily.   blood glucose meter kit and supplies KIT Dispense based on patient and insurance preference. Use up to four times daily as directed. (FOR ICD-9 250.00, 250.01).   Blood Glucose Monitoring Suppl (ACCU-CHEK GUIDE) w/Device KIT Use to check your blood sugar daily   Blood Pressure Monitor DEVI Use to check blood pressure daily.   cetirizine (ZYRTEC) 10 MG tablet TAKE 1 TABLET(10 MG) BY MOUTH DAILY   Cholecalciferol (VITAMIN D3) 50 MCG (2000 UT) TABS Take 1 tablet by mouth daily.   ciclopirox (LOPROX) 0.77 % cream Apply topically 2 (two) times daily.   ciprofloxacin-dexamethasone (CIPRODEX) OTIC suspension Place 4 drops into the right ear 2 (two)  times daily.   doxycycline (VIBRA-TABS) 100 MG tablet Take 1 tablet (100 mg total) by mouth 2 (two) times daily for 10 days.   ezetimibe (ZETIA) 10 MG tablet TAKE 1 TABLET(10 MG) BY MOUTH DAILY   fenofibrate (TRICOR) 48 MG tablet TAKE 1 TABLET(48 MG) BY MOUTH DAILY   fluconazole (DIFLUCAN) 150 MG tablet Take 1 tablet (150 mg total) by mouth daily.   fluticasone (FLONASE) 50 MCG/ACT nasal spray Place 2 sprays into both nostrils daily.   glucose blood (ACCU-CHEK GUIDE) test strip USE AS DIRECTED FOUR TIMES DAILY.   hydrochlorothiazide (HYDRODIURIL) 25 MG tablet Take 1 tablet (25 mg total) by mouth daily.    loratadine (CLARITIN) 10 MG tablet Take 1 tablet (10 mg total) by mouth daily.   metFORMIN (GLUCOPHAGE-XR) 500 MG 24 hr tablet Take 1 tablet (500 mg total) by mouth in the morning and at bedtime.   Multiple Vitamins-Minerals (MULTIVITAMIN WITH MINERALS) tablet Take 1 tablet by mouth daily.   nystatin cream (MYCOSTATIN) Apply 1 application. topically 2 (two) times daily.   omega-3 acid ethyl esters (LOVAZA) 1 g capsule TAKE 1 CAPSULE BY MOUTH EVERY DAY   ondansetron (ZOFRAN) 4 MG tablet Take 1 tablet (4 mg total) by mouth every 8 (eight) hours as needed for nausea or vomiting.   PROAIR HFA 108 (90 Base) MCG/ACT inhaler INHALE 2 PUFFS INTO THE LUNGS EVERY 4 HOURS AS NEEDED FOR WHEEZING OR SHORTNESS OF BREATH   Probiotic Product (PROBIOTIC-10 PO) Take by mouth.   rosuvastatin (CRESTOR) 10 MG tablet Take 1 tablet (10 mg total) by mouth daily.   Semaglutide, 2 MG/DOSE, (OZEMPIC, 2 MG/DOSE,) 8 MG/3ML SOPN Inject 2 mg into the skin once a week.   valACYclovir (VALTREX) 1000 MG tablet Take 1 tablet (1,000 mg total) by mouth 2 (two) times daily.   No facility-administered encounter medications on file as of 06/17/2022.     Review of Systems  Review of Systems  Constitutional: Negative.   HENT:  Positive for congestion, postnasal drip, sinus pressure and sinus pain.   Cardiovascular: Negative.   Gastrointestinal: Negative.   Genitourinary:  Positive for dysuria and frequency.  Allergic/Immunologic: Negative.   Neurological: Negative.   Psychiatric/Behavioral: Negative.         Physical Exam  BP 130/68 (BP Location: Left Arm, Patient Position: Sitting, Cuff Size: Large)   Pulse 80   Ht _0  (1.499 m)   Wt 181 lb (82.1 kg)   LMP 08/22/2011 Comment: patient states LMP 20 years ago  SpO2 99%   BMI 36.56 kg/m   Wt Readings from Last 5 Encounters:  06/17/22 181 lb (82.1 kg)  04/08/22 179 lb 4 oz (81.3 kg)  02/17/22 183 lb 6.4 oz (83.2 kg)  12/31/21 188 lb 12.8 oz (85.6 kg)  12/23/21  187 lb 6.4 oz (85 kg)     Physical Exam Vitals and nursing note reviewed.  Constitutional:      General: She is not in acute distress.    Appearance: She is well-developed.  Cardiovascular:     Rate and Rhythm: Normal rate and regular rhythm.  Pulmonary:     Effort: Pulmonary effort is normal.     Breath sounds: Normal breath sounds.  Neurological:     Mental Status: She is alert and oriented to person, place, and time.      Lab Results:  CBC    Component Value Date/Time   WBC 8.6 04/08/2022 1609   WBC 6.5 05/18/2018 0855   RBC  5.09 04/08/2022 1609   RBC 5.08 05/18/2018 0855   HGB 14.3 04/08/2022 1609   HCT 41.8 04/08/2022 1609   PLT 370 04/08/2022 1609   MCV 82 04/08/2022 1609   MCH 28.1 04/08/2022 1609   MCH 28.1 05/18/2018 0855   MCHC 34.2 04/08/2022 1609   MCHC 33.6 05/18/2018 0855   RDW 13.5 04/08/2022 1609   LYMPHSABS 3.9 (H) 03/10/2021 1151   MONOABS 520 12/15/2016 1230   EOSABS 0.1 03/10/2021 1151   BASOSABS 0.0 03/10/2021 1151    BMET    Component Value Date/Time   NA 141 04/08/2022 1609   K 4.4 04/08/2022 1609   CL 99 04/08/2022 1609   CO2 24 04/08/2022 1609   GLUCOSE 85 04/08/2022 1609   GLUCOSE 141 (H) 05/18/2018 0855   BUN 15 04/08/2022 1609   CREATININE 1.01 (H) 04/08/2022 1609   CREATININE 0.82 12/15/2016 1230   CALCIUM 10.4 (H) 04/08/2022 1609   GFRNONAA 77 03/16/2020 1047   GFRNONAA 84 12/15/2016 1230   GFRAA 89 03/16/2020 1047   GFRAA >89 12/15/2016 1230      Assessment & Plan:   1. Urinary frequency  - POCT URINALYSIS DIP (CLINITEK) - doxycycline (VIBRA-TABS) 100 MG tablet; Take 1 tablet (100 mg total) by mouth 2 (two) times daily for 10 days.  Dispense: 20 tablet; Refill: 0  2. Upper respiratory tract infection, unspecified type  - doxycycline (VIBRA-TABS) 100 MG tablet; Take 1 tablet (100 mg total) by mouth 2 (two) times daily for 10 days.  Dispense: 20 tablet; Refill: 0   Follow up:  Follow up as schedule   Fenton Foy, NP 06/17/2022

## 2022-06-29 ENCOUNTER — Ambulatory Visit: Payer: Medicaid Other | Attending: Internal Medicine | Admitting: Internal Medicine

## 2022-06-29 VITALS — BP 124/78 | HR 79 | Ht 59.0 in | Wt 182.6 lb

## 2022-06-29 DIAGNOSIS — T466X5D Adverse effect of antihyperlipidemic and antiarteriosclerotic drugs, subsequent encounter: Secondary | ICD-10-CM | POA: Diagnosis not present

## 2022-06-29 DIAGNOSIS — R0609 Other forms of dyspnea: Secondary | ICD-10-CM

## 2022-06-29 DIAGNOSIS — M791 Myalgia, unspecified site: Secondary | ICD-10-CM | POA: Diagnosis not present

## 2022-06-29 DIAGNOSIS — Z789 Other specified health status: Secondary | ICD-10-CM | POA: Diagnosis not present

## 2022-06-29 DIAGNOSIS — R072 Precordial pain: Secondary | ICD-10-CM | POA: Diagnosis not present

## 2022-06-29 DIAGNOSIS — E782 Mixed hyperlipidemia: Secondary | ICD-10-CM | POA: Diagnosis not present

## 2022-06-29 DIAGNOSIS — T466X5A Adverse effect of antihyperlipidemic and antiarteriosclerotic drugs, initial encounter: Secondary | ICD-10-CM

## 2022-06-29 NOTE — Patient Instructions (Addendum)
Medication Instructions:  No Changes In Medications at this time.  *If you need a refill on your cardiac medications before your next appointment, please call your pharmacy*  Lab Work: None Ordered At This Time.  If you have labs (blood work) drawn today and your tests are completely normal, you will receive your results only by: [place] (if you have MyChart) OR A paper copy in the mail If you have any lab test that is abnormal or we need to change your treatment, we will call you to review the results.  Testing/Procedures: Your physician has requested that you have an echocardiogram IN 6 MONTHS. Echocardiography is a painless test that uses sound waves to create images of your heart. It provides your doctor with information about the size and shape of your heart and how well your heart's chambers and valves are working. You may receive an ultrasound enhancing agent through an IV if needed to better visualize your heart during the echo.This procedure takes approximately one hour. There are no restrictions for this procedure. This will take place at the [address], [address].    Follow-Up: At [facility], you and your health needs are our priority.  As part of our continuing mission to provide you with exceptional heart care, we have created designated Provider Care Teams.  These Care Teams include your primary Cardiologist (physician) and Advanced Practice Providers (APPs -  Physician Assistants and Nurse Practitioners) who all work together to provide you with the care you need, when you need it.  Your next appointment:   6 month(s) RIGHT AFTER ECHO  The format for your next appointment:   In Person  Provider:   Elouise Munroe, MD

## 2022-06-29 NOTE — Progress Notes (Signed)
Cardiology Office Note:    Date:  06/29/2022   ID:  Jessica Lawson, DOB March 24, 1966, MRN [id number]  PCP:  Fenton Foy, NP  Cardiologist:  Elouise Munroe, MD  Electrophysiologist:  None   Referring MD: Fenton Foy, NP   Chief Complaint/Reason for Referral: Cardiovascular review, f/u  History of Present Illness:    Jessica Lawson is a 56 y.o. female with a history of DM 2 on Metformin, hyperlipidemia/hypertriglyceridemia, hypertension.  Overall doing well. On ozempic now, tolerating well. Modest weight change.   The patient denies chest pain, chest pressure, dyspnea at rest or with exertion, palpitations, PND, orthopnea, or leg swelling. Denies cough, fever, chills. Denies nausea, vomiting. Denies syncope or presyncope. Denies dizziness or lightheadedness.    Past Medical History:  Diagnosis Date   Allergy    Back pain    Diabetes mellitus without complication (HCC)    High blood cholesterol    Hypertension    Kidney stone    Normal cardiac stress test 02/2016   low risk   Obesity    Pain of cheek    and right side neck   Tobacco use    UTI (lower urinary tract infection)    Wrist pain    left wrist    Past Surgical History:  Procedure Laterality Date   CESAREAN SECTION     3 c-sections   DENTAL SURGERY     teeth removed with meds to calm     Current Medications: No outpatient medications have been marked as taking for the 06/29/22 encounter (Office Visit) with Elouise Munroe, MD.   Reviewed in chart, unable to pull into note.  Allergies:   Asa [aspirin], Iohexol, Penicillins, Sulfa antibiotics, and Nystatin   Social History   Tobacco Use   Smoking status: Former    Packs/day: 1.00    Types: Cigarettes    Quit date: 07/26/2016    Years since quitting: 5.9   Smokeless tobacco: Never  Vaping Use   Vaping Use: Never used  Substance Use Topics   Alcohol use: No   Drug use: No     Family History: The patient's family history  includes Diabetes in her mother, sister, sister, and another family member; Hyperlipidemia in an other family member; Hypertension in her sister and another family member; Lung disease in her father. There is no history of Colon cancer, Colon polyps, Esophageal cancer, Stomach cancer, or Rectal cancer.  ROS:   Please see the history of present illness.    All other systems reviewed and are negative.  EKGs/Labs/Other Studies Reviewed:    The following studies were reviewed today:  EKG:  NSR, Nonspecific T wave abnl  Recent Labs: 04/08/2022: ALT 39; BUN 15; Creatinine, Ser 1.01; Hemoglobin 14.3; Platelets 370; Potassium 4.4; Sodium 141  Recent Lipid Panel    Component Value Date/Time   CHOL 174 09/10/2021 1530   TRIG 194 (H) 09/10/2021 1530   HDL 38 (L) 09/10/2021 1530   CHOLHDL 4.6 (H) 09/10/2021 1530   CHOLHDL 7.3 (H) 07/12/2017 0950   VLDL 50 (H) 04/06/2017 0902   LDLCALC 102 (H) 09/10/2021 1530   LDLCALC 183 (H) 07/12/2017 0950    Physical Exam:    VS:  BP 124/78   Pulse 79   Ht '4\' 11"'$  (1.499 m)   Wt 182 lb 9.6 oz (82.8 kg)   LMP 08/22/2011 Comment: patient states LMP 20 years ago  SpO2 97%   BMI 36.88 kg/m  Wt Readings from Last 5 Encounters:  07/11/22 180 lb (81.6 kg)  06/29/22 182 lb 9.6 oz (82.8 kg)  06/17/22 181 lb (82.1 kg)  04/08/22 179 lb 4 oz (81.3 kg)  02/17/22 183 lb 6.4 oz (83.2 kg)    Constitutional: No acute distress Eyes: sclera non-icteric, normal conjunctiva and lids ENMT: normal dentition, moist mucous membranes Cardiovascular: regular rhythm, normal rate, no murmurs. S1 and S2 normal. Radial pulses normal bilaterally. No jugular venous distention.  Respiratory: clear to auscultation bilaterally GI : normal bowel sounds, soft and nontender. No distention.   MSK: extremities warm, well perfused. No edema.  NEURO: grossly nonfocal exam, moves all extremities. PSYCH: alert and oriented x 3, normal mood and affect.   ASSESSMENT:    1.  Hypercholesterolemia with hypertriglyceridemia   2. Dyspnea on exertion   3. Precordial pain   4. Myalgia due to statin   5. Statin intolerance      PLAN:    Precordial pain - no recurrence. Thinks GI related. Follow, and stress test if symptoms. Consider PET-CT MPI if recurrent symptoms.   Myalgia due to statin Statin intolerance Hypercholesterolemia with hypertriglyceridemia - follows with lipid clinic - continue crestor 10 mg daily - continue zetia 10 mg daily - continue fenofibrate 48 mg daily - continue lovaza 1 g daily.  - now on ozempic, may assist with Trig mgmt given BG control. Repeat lipids next available fasting.   Essential hypertension - stable BP on HCTZ 25 mg daily  DOE  - likely related to URI but will update echo.  Total time of encounter: 30 minutes total time of encounter, including 20 minutes spent in face-to-face patient care on the date of this encounter. This time includes coordination of care and counseling regarding above mentioned problem list. Remainder of non-face-to-face time involved reviewing chart documents/testing relevant to the patient encounter and documentation in the medical record. I have independently reviewed documentation from referring provider.   Cherlynn Kaiser, MD, [facility]      Medication Adjustments/Labs and Tests Ordered: Current medicines are reviewed at length with the patient today.  Concerns regarding medicines are outlined above.   Orders Placed This Encounter  Procedures   EKG 12-Lead   ECHOCARDIOGRAM COMPLETE    Shared Decision Making/Informed Consent:     No orders of the defined types were placed in this encounter.    Patient Instructions  Medication Instructions:  No Changes In Medications at this time.  *If you need a refill on your cardiac medications before your next appointment, please call your pharmacy*  Lab Work: None Ordered At This Time.  If you have labs (blood  work) drawn today and your tests are completely normal, you will receive your results only by: [place] (if you have MyChart) OR A paper copy in the mail If you have any lab test that is abnormal or we need to change your treatment, we will call you to review the results.  Testing/Procedures: Your physician has requested that you have an echocardiogram IN 6 MONTHS. Echocardiography is a painless test that uses sound waves to create images of your heart. It provides your doctor with information about the size and shape of your heart and how well your heart's chambers and valves are working. You may receive an ultrasound enhancing agent through an IV if needed to better visualize your heart during the echo.This procedure takes approximately one hour. There are no restrictions for this procedure. This will take  place at the [address].    Follow-Up: At [facility], you and your health needs are our priority.  As part of our continuing mission to provide you with exceptional heart care, we have created designated Provider Care Teams.  These Care Teams include your primary Cardiologist (physician) and Advanced Practice Providers (APPs -  Physician Assistants and Nurse Practitioners) who all work together to provide you with the care you need, when you need it.  Your next appointment:   6 month(s) RIGHT AFTER ECHO  The format for your next appointment:   In Person  Provider:   Elouise Munroe, MD

## 2022-07-05 ENCOUNTER — Other Ambulatory Visit: Payer: Self-pay | Admitting: Internal Medicine

## 2022-07-05 DIAGNOSIS — E119 Type 2 diabetes mellitus without complications: Secondary | ICD-10-CM

## 2022-07-11 ENCOUNTER — Ambulatory Visit: Payer: Medicaid Other | Admitting: Nurse Practitioner

## 2022-07-11 ENCOUNTER — Encounter: Payer: Self-pay | Admitting: Nurse Practitioner

## 2022-07-11 VITALS — BP 125/53 | HR 87 | Temp 97.6°F | Ht 59.0 in | Wt 180.0 lb

## 2022-07-11 DIAGNOSIS — E785 Hyperlipidemia, unspecified: Secondary | ICD-10-CM | POA: Diagnosis not present

## 2022-07-11 DIAGNOSIS — R110 Nausea: Secondary | ICD-10-CM

## 2022-07-11 DIAGNOSIS — R0602 Shortness of breath: Secondary | ICD-10-CM

## 2022-07-11 DIAGNOSIS — N390 Urinary tract infection, site not specified: Secondary | ICD-10-CM | POA: Diagnosis not present

## 2022-07-11 DIAGNOSIS — J0100 Acute maxillary sinusitis, unspecified: Secondary | ICD-10-CM | POA: Diagnosis not present

## 2022-07-11 DIAGNOSIS — B009 Herpesviral infection, unspecified: Secondary | ICD-10-CM | POA: Diagnosis not present

## 2022-07-11 DIAGNOSIS — E119 Type 2 diabetes mellitus without complications: Secondary | ICD-10-CM

## 2022-07-11 DIAGNOSIS — J069 Acute upper respiratory infection, unspecified: Secondary | ICD-10-CM | POA: Diagnosis not present

## 2022-07-11 DIAGNOSIS — J301 Allergic rhinitis due to pollen: Secondary | ICD-10-CM | POA: Diagnosis not present

## 2022-07-11 LAB — POCT URINALYSIS DIPSTICK
Bilirubin, UA: NEGATIVE
Glucose, UA: NEGATIVE
Ketones, UA: NEGATIVE
Nitrite, UA: NEGATIVE
Protein, UA: NEGATIVE
Spec Grav, UA: >=1.03 — AB (ref 1.010–1.025)
Urobilinogen, UA: 0.2 E.U./dL
pH, UA: 5.5 (ref 5.0–8.0)

## 2022-07-11 MED ORDER — METFORMIN HCL ER 500 MG PO TB24: 500.0000 mg | ORAL_TABLET | Freq: Two times a day (BID) | ORAL | 5 refills | Status: DC

## 2022-07-11 MED ORDER — OZEMPIC (2 MG/DOSE) 8 MG/3ML ~~LOC~~ SOPN: 2.0000 mg | PEN_INJECTOR | SUBCUTANEOUS | 5 refills | Status: DC

## 2022-07-11 MED ORDER — ONDANSETRON HCL 4 MG PO TABS: 4.0000 mg | ORAL_TABLET | Freq: Three times a day (TID) | ORAL | 1 refills | Status: DC | PRN

## 2022-07-11 MED ORDER — EZETIMIBE 10 MG PO TABS: ORAL_TABLET | ORAL | 2 refills | Status: DC

## 2022-07-11 MED ORDER — CIPROFLOXACIN HCL 500 MG PO TABS: 500.0000 mg | ORAL_TABLET | Freq: Two times a day (BID) | ORAL | 0 refills | Status: AC

## 2022-07-11 MED ORDER — HYDROCHLOROTHIAZIDE 25 MG PO TABS: 25.0000 mg | ORAL_TABLET | Freq: Every day | ORAL | 0 refills | Status: DC

## 2022-07-11 MED ORDER — VALACYCLOVIR HCL 1 G PO TABS: 1000.0000 mg | ORAL_TABLET | Freq: Two times a day (BID) | ORAL | 0 refills | Status: DC

## 2022-07-11 MED ORDER — ALBUTEROL SULFATE HFA 108 (90 BASE) MCG/ACT IN AERS: 2.0000 | INHALATION_SPRAY | RESPIRATORY_TRACT | 3 refills | Status: AC | PRN

## 2022-07-11 MED ORDER — ROSUVASTATIN CALCIUM 10 MG PO TABS: 10.0000 mg | ORAL_TABLET | Freq: Every day | ORAL | 0 refills | Status: DC

## 2022-07-11 MED ORDER — FLUTICASONE PROPIONATE 50 MCG/ACT NA SUSP: 2.0000 | Freq: Every day | NASAL | 6 refills | Status: AC

## 2022-07-11 MED ORDER — CETIRIZINE HCL 10 MG PO TABS: ORAL_TABLET | ORAL | 3 refills | Status: DC

## 2022-07-11 MED ORDER — FENOFIBRATE 48 MG PO TABS: ORAL_TABLET | ORAL | 1 refills | Status: DC
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
## 2022-07-27 ENCOUNTER — Other Ambulatory Visit: Payer: Self-pay | Admitting: Nurse Practitioner

## 2022-07-27 DIAGNOSIS — E785 Hyperlipidemia, unspecified: Secondary | ICD-10-CM

## 2022-07-29 ENCOUNTER — Ambulatory Visit: Payer: Medicaid Other | Admitting: Internal Medicine

## 2022-08-25 ENCOUNTER — Other Ambulatory Visit: Payer: Self-pay

## 2022-08-25 DIAGNOSIS — E785 Hyperlipidemia, unspecified: Secondary | ICD-10-CM

## 2022-08-25 MED ORDER — OMEGA-3-ACID ETHYL ESTERS 1 G PO CAPS: ORAL_CAPSULE | ORAL | 2 refills | Status: DC

## 2022-09-13 DIAGNOSIS — J069 Acute upper respiratory infection, unspecified: Secondary | ICD-10-CM | POA: Diagnosis not present

## 2022-09-13 DIAGNOSIS — Z20822 Contact with and (suspected) exposure to covid-19: Secondary | ICD-10-CM | POA: Diagnosis not present

## 2022-10-10 ENCOUNTER — Encounter: Payer: Self-pay | Admitting: Nurse Practitioner

## 2022-10-10 ENCOUNTER — Ambulatory Visit: Payer: Medicaid Other | Admitting: Nurse Practitioner

## 2022-10-10 VITALS — BP 134/72 | HR 77 | Temp 97.1°F | Ht 59.0 in | Wt 184.2 lb

## 2022-10-10 DIAGNOSIS — E119 Type 2 diabetes mellitus without complications: Secondary | ICD-10-CM

## 2022-10-10 DIAGNOSIS — Z13220 Encounter for screening for lipoid disorders: Secondary | ICD-10-CM

## 2022-10-10 DIAGNOSIS — J301 Allergic rhinitis due to pollen: Secondary | ICD-10-CM

## 2022-10-10 LAB — POCT GLYCOSYLATED HEMOGLOBIN (HGB A1C): Hemoglobin A1C: 6.6 % — AB (ref 4.0–5.6)

## 2022-10-10 MED ORDER — FEXOFENADINE HCL 180 MG PO TABS: 180.0000 mg | ORAL_TABLET | Freq: Every day | ORAL | 2 refills | Status: DC

## 2022-10-10 NOTE — Progress Notes (Signed)
$'@Patient'm$  ID: Jessica Lawson, female    DOB: 12/14/1965, 57 y.o.   MRN: [id number]  Chief Complaint  Patient presents with   Diabetes    Follow up    Referring provider: Fenton Foy, NP   HPI  Jessica Lawson presents for follow up. Jessica Lawson  has a past medical history of Allergy, Back pain, Diabetes mellitus without complication ([place]), High blood cholesterol, Hypertension, Kidney stone, Normal cardiac stress test (02/2016), Obesity, Pain of cheek, Tobacco use, UTI (lower urinary tract infection), and Wrist pain.     57 y.o. female for follow up of diabetes. Diabetic Review of Systems - medication compliance: compliant most of the time, diabetic diet compliance: noncompliant much of the time, home glucose monitoring: is performed regularly.  Other symptoms and concerns: none. Denies f/c/s, n/v/d, hemoptysis, PND, chest pain or edema. A1C 6.6 today.       Allergies  Allergen Reactions   Asa [Aspirin] Anaphylaxis, Hives and Swelling   Iohexol Other (See Comments)     Desc: pt complains of difficulty swallowing and thickened tongue/ throat closes up    Penicillins Hives, Swelling and Other (See Comments)    Thrush    Sulfa Antibiotics Anaphylaxis, Hives, Swelling and Other (See Comments)   Nystatin Other (See Comments)    thrush    Immunization History  Administered Date(s) Administered   Influenza,inj,Quad PF,6+ Mos 05/22/2020, 05/20/2022   MMRV 06/18/2020   PFIZER(Purple Top)SARS-COV-2 Vaccination 02/07/2020, 02/28/2020   Pneumococcal Polysaccharide-23 09/03/2015   Tdap 11/01/2018    Past Medical History:  Diagnosis Date   Allergy    Back pain    Diabetes mellitus without complication (HCC)    High blood cholesterol    Hypertension    Kidney stone    Normal cardiac stress test 02/2016   low risk   Obesity    Pain of cheek    and right side neck   Tobacco use    UTI (lower urinary tract infection)    Wrist pain    left wrist    Tobacco History: Social  History   Tobacco Use  Smoking Status Former   Packs/day: 1.00   Types: Cigarettes   Quit date: 07/26/2016   Years since quitting: 6.2  Smokeless Tobacco Never   Counseling given: Not Answered   Outpatient Encounter Medications as of 10/10/2022  Medication Sig   Accu-Chek Softclix Lancets lancets USE AS DIRECTED FOUR TIMES DAILY.   albuterol (PROAIR HFA) 108 (90 Base) MCG/ACT inhaler Inhale 2 puffs into the lungs every 4 (four) hours as needed for wheezing or shortness of breath.   ANEFRIN NASAL SPRAY 0.05 % nasal spray Place into both nostrils.   ascorbic acid (VITAMIN C) 1000 MG tablet Take 2,000 mg by mouth daily.   blood glucose meter kit and supplies KIT Dispense based on patient and insurance preference. Use up to four times daily as directed. (FOR ICD-9 250.00, 250.01).   Blood Glucose Monitoring Suppl (ACCU-CHEK GUIDE) w/Device KIT Use to check your blood sugar daily   Blood Pressure Monitor DEVI Use to check blood pressure daily.   Cholecalciferol (VITAMIN D3) 50 MCG (2000 UT) TABS Take 1 tablet by mouth daily.   ciclopirox (LOPROX) 0.77 % cream Apply topically 2 (two) times daily.   ezetimibe (ZETIA) 10 MG tablet TAKE 1 TABLET(10 MG) BY MOUTH DAILY   fenofibrate (TRICOR) 48 MG tablet TAKE 1 TABLET(48 MG) BY MOUTH DAILY   fexofenadine (ALLEGRA ALLERGY) 180 MG tablet Take 1 tablet (  180 mg total) by mouth daily.   fluticasone (FLONASE) 50 MCG/ACT nasal spray Place 2 sprays into both nostrils daily.   glucose blood (ACCU-CHEK GUIDE) test strip USE AS DIRECTED FOUR TIMES DAILY.   hydrochlorothiazide (HYDRODIURIL) 25 MG tablet Take 1 tablet (25 mg total) by mouth daily.   metFORMIN (GLUCOPHAGE-XR) 500 MG 24 hr tablet Take 1 tablet (500 mg total) by mouth in the morning and at bedtime.   Multiple Vitamins-Minerals (MULTIVITAMIN WITH MINERALS) tablet Take 1 tablet by mouth daily.   nystatin cream (MYCOSTATIN) Apply 1 application. topically 2 (two) times daily.   omega-3 acid ethyl  esters (LOVAZA) 1 g capsule TAKE 1 CAPSULE BY MOUTH EVERY DAY   ondansetron (ZOFRAN) 4 MG tablet Take 1 tablet (4 mg total) by mouth every 8 (eight) hours as needed for nausea or vomiting.   Probiotic Product (PROBIOTIC-10 PO) Take by mouth.   rosuvastatin (CRESTOR) 10 MG tablet Take 1 tablet (10 mg total) by mouth daily.   Semaglutide, 2 MG/DOSE, (OZEMPIC, 2 MG/DOSE,) 8 MG/3ML SOPN Inject 2 mg into the skin once a week.   [DISCONTINUED] cetirizine (ZYRTEC) 10 MG tablet TAKE 1 TABLET(10 MG) BY MOUTH DAILY   [DISCONTINUED] loratadine (CLARITIN) 10 MG tablet Take 1 tablet (10 mg total) by mouth daily.   ciprofloxacin-dexamethasone (CIPRODEX) OTIC suspension Place 4 drops into the right ear 2 (two) times daily. (Patient not taking: Reported on 10/10/2022)   fluconazole (DIFLUCAN) 150 MG tablet Take 1 tablet (150 mg total) by mouth daily. (Patient not taking: Reported on 10/10/2022)   valACYclovir (VALTREX) 1000 MG tablet Take 1 tablet (1,000 mg total) by mouth 2 (two) times daily.   No facility-administered encounter medications on file as of 10/10/2022.     Review of Systems  Review of Systems  Constitutional: Negative.   HENT: Negative.    Cardiovascular: Negative.   Gastrointestinal: Negative.   Allergic/Immunologic: Negative.   Neurological: Negative.   Psychiatric/Behavioral: Negative.         Physical Exam  BP 134/72   Pulse 77   Temp (!) 97.1 F (36.2 C)   Ht '4\' 11"'$  (1.499 m)   Wt 184 lb 3.2 oz (83.6 kg)   LMP 08/22/2011 Comment: patient states LMP 20 years ago  SpO2 99%   BMI 37.20 kg/m   Wt Readings from Last 5 Encounters:  10/10/22 184 lb 3.2 oz (83.6 kg)  07/11/22 180 lb (81.6 kg)  06/29/22 182 lb 9.6 oz (82.8 kg)  06/17/22 181 lb (82.1 kg)  04/08/22 179 lb 4 oz (81.3 kg)     Physical Exam Vitals and nursing note reviewed.  Constitutional:      General: Jessica Lawson is not in acute distress.    Appearance: Jessica Lawson is well-developed.  Cardiovascular:     Rate and  Rhythm: Normal rate and regular rhythm.  Pulmonary:     Effort: Pulmonary effort is normal.     Breath sounds: Normal breath sounds.  Neurological:     Mental Status: Jessica Lawson is alert and oriented to person, place, and time.      Lab Results:  CBC    Component Value Date/Time   WBC 8.6 04/08/2022 1609   WBC 6.5 05/18/2018 0855   RBC 5.09 04/08/2022 1609   RBC 5.08 05/18/2018 0855   HGB 14.3 04/08/2022 1609   HCT 41.8 04/08/2022 1609   PLT 370 04/08/2022 1609   MCV 82 04/08/2022 1609   MCH 28.1 04/08/2022 1609   MCH 28.1 05/18/2018 0855  MCHC 34.2 04/08/2022 1609   MCHC 33.6 05/18/2018 0855   RDW 13.5 04/08/2022 1609   LYMPHSABS 3.9 (H) 03/10/2021 1151   MONOABS 520 12/15/2016 1230   EOSABS 0.1 03/10/2021 1151   BASOSABS 0.0 03/10/2021 1151    BMET    Component Value Date/Time   NA 141 04/08/2022 1609   K 4.4 04/08/2022 1609   CL 99 04/08/2022 1609   CO2 24 04/08/2022 1609   GLUCOSE 85 04/08/2022 1609   GLUCOSE 141 (H) 05/18/2018 0855   BUN 15 04/08/2022 1609   CREATININE 1.01 (H) 04/08/2022 1609   CREATININE 0.82 12/15/2016 1230   CALCIUM 10.4 (H) 04/08/2022 1609   GFRNONAA 77 03/16/2020 1047   GFRNONAA 84 12/15/2016 1230   GFRAA 89 03/16/2020 1047   GFRAA >89 12/15/2016 1230      Assessment & Plan:   Type 2 diabetes mellitus without complication, without long-term current use of insulin (HCC) - POCT glycosylated hemoglobin (Hb A1C) - Microalbumin/Creatinine Ratio, Urine - Ambulatory referral to Ophthalmology - Ambulatory referral to Podiatry - Lipid Panel - CBC - Comprehensive metabolic panel  2. Allergic rhinitis due to pollen, unspecified seasonality  - fexofenadine (ALLEGRA ALLERGY) 180 MG tablet; Take 1 tablet (180 mg total) by mouth daily.  Dispense: 30 tablet; Refill: 2  3. Lipid screening  - Lipid Panel   Follow up:  Follow up in 3 months     Fenton Foy, NP 10/10/2022

## 2022-10-10 NOTE — Patient Instructions (Addendum)
1. Type 2 diabetes mellitus without complication, without long-term current use of insulin (HCC)  - POCT glycosylated hemoglobin (Hb A1C) - Microalbumin/Creatinine Ratio, Urine - Ambulatory referral to Ophthalmology - Ambulatory referral to Podiatry - Lipid Panel - CBC - Comprehensive metabolic panel  2. Allergic rhinitis due to pollen, unspecified seasonality  - fexofenadine (ALLEGRA ALLERGY) 180 MG tablet; Take 1 tablet (180 mg total) by mouth daily.  Dispense: 30 tablet; Refill: 2  3. Lipid screening  - Lipid Panel   Follow up:  Follow up in 3 months

## 2022-10-10 NOTE — Assessment & Plan Note (Signed)
-   POCT glycosylated hemoglobin (Hb A1C) - Microalbumin/Creatinine Ratio, Urine - Ambulatory referral to Ophthalmology - Ambulatory referral to Podiatry - Lipid Panel - CBC - Comprehensive metabolic panel  2. Allergic rhinitis due to pollen, unspecified seasonality  - fexofenadine (ALLEGRA ALLERGY) 180 MG tablet; Take 1 tablet (180 mg total) by mouth daily.  Dispense: 30 tablet; Refill: 2  3. Lipid screening  - Lipid Panel   Follow up:  Follow up in 3 months

## 2022-10-11 ENCOUNTER — Telehealth: Payer: Self-pay

## 2022-10-11 LAB — CBC
Hematocrit: 41.6 % (ref 34.0–46.6)
Hemoglobin: 13.9 g/dL (ref 11.1–15.9)
MCH: 28.5 pg (ref 26.6–33.0)
MCHC: 33.4 g/dL (ref 31.5–35.7)
MCV: 85 fL (ref 79–97)
Platelets: 390 10*3/uL (ref 150–450)
RBC: 4.87 x10E6/uL (ref 3.77–5.28)
RDW: 13.9 % (ref 11.7–15.4)
WBC: 6.6 10*3/uL (ref 3.4–10.8)

## 2022-10-11 LAB — COMPREHENSIVE METABOLIC PANEL
ALT: 46 IU/L — ABNORMAL HIGH (ref 0–32)
AST: 28 IU/L (ref 0–40)
Albumin/Globulin Ratio: 1.7 (ref 1.2–2.2)
Albumin: 4.4 g/dL (ref 3.8–4.9)
Alkaline Phosphatase: 48 IU/L (ref 44–121)
BUN/Creatinine Ratio: 15 (ref 9–23)
BUN: 14 mg/dL (ref 6–24)
Bilirubin Total: 0.3 mg/dL (ref 0.0–1.2)
CO2: 23 mmol/L (ref 20–29)
Calcium: 9.5 mg/dL (ref 8.7–10.2)
Chloride: 105 mmol/L (ref 96–106)
Creatinine, Ser: 0.92 mg/dL (ref 0.57–1.00)
Globulin, Total: 2.6 g/dL (ref 1.5–4.5)
Glucose: 127 mg/dL — ABNORMAL HIGH (ref 70–99)
Potassium: 3.8 mmol/L (ref 3.5–5.2)
Sodium: 142 mmol/L (ref 134–144)
Total Protein: 7 g/dL (ref 6.0–8.5)
eGFR: 73 mL/min/{1.73_m2} (ref >59)

## 2022-10-11 LAB — MICROALBUMIN / CREATININE URINE RATIO
Creatinine, Urine: 316.5 mg/dL
Microalb/Creat Ratio: 7 mg/g creat (ref 0–29)
Microalbumin, Urine: 23.1 ug/mL

## 2022-10-11 LAB — LIPID PANEL
Chol/HDL Ratio: 3.4 ratio (ref 0.0–4.4)
Cholesterol, Total: 124 mg/dL (ref 100–199)
HDL: 37 mg/dL — ABNORMAL LOW (ref >39)
LDL Chol Calc (NIH): 61 mg/dL (ref 0–99)
Triglycerides: 148 mg/dL (ref 0–149)
VLDL Cholesterol Cal: 26 mg/dL (ref 5–40)

## 2022-10-11 NOTE — Progress Notes (Signed)
   Care Guide Note  10/11/2022 Name: [name] MRN: [id number] DOB: [birth year]  Referred by: Fenton Foy, NP Reason for referral : Care Coordination (Outreach to schedule with Pharm d )   Jessica Lawson is a 57 y.o. year old female who is a primary care patient of Fenton Foy, NP. VIDEL ORPILLA was referred to the pharmacist for assistance related to DM.    Successful contact was made with the patient to discuss pharmacy services including being ready for the pharmacist to call at least 5 minutes before the scheduled appointment time, to have medication bottles and any blood sugar or blood pressure readings ready for review. The patient agreed to meet with the pharmacist via with the pharmacist via telephone visit on (date/time).  11/14/2022  Noreene Larsson, [place], [place] [address] Direct Dial: [phone] [email or web address]

## 2022-10-30 ENCOUNTER — Emergency Department (HOSPITAL_COMMUNITY)
Admission: EM | Admit: 2022-10-30 | Discharge: 2022-10-30 | Disposition: A | Discharge status: Home / Self Care | Payer: Medicaid Other | Attending: Emergency Medicine | Admitting: Emergency Medicine

## 2022-10-30 ENCOUNTER — Emergency Department (HOSPITAL_COMMUNITY): Payer: Medicaid Other

## 2022-10-30 DIAGNOSIS — R9431 Abnormal electrocardiogram [ECG] [EKG]: Secondary | ICD-10-CM

## 2022-10-30 DIAGNOSIS — M25531 Pain in right wrist: Secondary | ICD-10-CM | POA: Diagnosis not present

## 2022-10-30 DIAGNOSIS — S63501A Unspecified sprain of right wrist, initial encounter: Secondary | ICD-10-CM | POA: Diagnosis not present

## 2022-10-30 DIAGNOSIS — R079 Chest pain, unspecified: Secondary | ICD-10-CM | POA: Diagnosis not present

## 2022-10-30 DIAGNOSIS — R0789 Other chest pain: Secondary | ICD-10-CM

## 2022-10-30 DIAGNOSIS — Y92410 Unspecified street and highway as the place of occurrence of the external cause: Secondary | ICD-10-CM | POA: Diagnosis not present

## 2022-10-30 DIAGNOSIS — S60811A Abrasion of right wrist, initial encounter: Secondary | ICD-10-CM | POA: Diagnosis present

## 2022-10-30 DIAGNOSIS — I4589 Other specified conduction disorders: Secondary | ICD-10-CM | POA: Insufficient documentation

## 2022-10-30 MED ORDER — ONDANSETRON 4 MG PO TBDP
4.0000 mg | ORAL_TABLET | Freq: Once | ORAL | Status: AC
  Administered 2022-10-30: 4 mg via ORAL
  Filled 2022-10-30: qty 1
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
## 2022-10-30 NOTE — ED Notes (Signed)
Pt stated she is having cp
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
## 2022-11-02 ENCOUNTER — Encounter: Payer: Self-pay | Admitting: Nurse Practitioner

## 2022-11-02 DIAGNOSIS — J069 Acute upper respiratory infection, unspecified: Secondary | ICD-10-CM | POA: Insufficient documentation

## 2022-11-02 NOTE — Patient Instructions (Signed)
1. Upper respiratory tract infection, unspecified type  - ciprofloxacin (CIPRO) 500 MG tablet; Take 1 tablet (500 mg total) by mouth 2 (two) times daily for 3 days.  Dispense: 6 tablet; Refill: 0  2. Urinary tract infection without hematuria, site unspecified  - ciprofloxacin (CIPRO) 500 MG tablet; Take 1 tablet (500 mg total) by mouth 2 (two) times daily for 3 days.  Dispense: 6 tablet; Refill: 0 - POCT urinalysis dipstick - Urine Culture  3. Hyperlipidemia, unspecified hyperlipidemia type  - fenofibrate (TRICOR) 48 MG tablet; TAKE 1 TABLET(48 MG) BY MOUTH DAILY  Dispense: 90 tablet; Refill: 1  4. Acute non-recurrent maxillary sinusitis   5. Allergic rhinitis due to pollen, unspecified seasonality  - fluticasone (FLONASE) 50 MCG/ACT nasal spray; Place 2 sprays into both nostrils daily.  Dispense: 16 g; Refill: 6  6. Type 2 diabetes mellitus without complication, without long-term current use of insulin (HCC)  - metFORMIN (GLUCOPHAGE-XR) 500 MG 24 hr tablet; Take 1 tablet (500 mg total) by mouth in the morning and at bedtime.  Dispense: 60 tablet; Refill: 5 - Semaglutide, 2 MG/DOSE, (OZEMPIC, 2 MG/DOSE,) 8 MG/3ML SOPN; Inject 2 mg into the skin once a week.  Dispense: 3 mL; Refill: 5  7. Nausea   8. Shortness of breath  - albuterol (PROAIR HFA) 108 (90 Base) MCG/ACT inhaler; Inhale 2 puffs into the lungs every 4 (four) hours as needed for wheezing or shortness of breath.  Dispense: 8.5 g; Refill: 3  9. Herpes simplex type 1 infection  - valACYclovir (VALTREX) 1000 MG tablet; Take 1 tablet (1,000 mg total) by mouth 2 (two) times daily.  Dispense: 20 tablet; Refill: 0  Follow up:  Follow up in 3 months

## 2022-11-02 NOTE — Assessment & Plan Note (Signed)
-   ciprofloxacin (CIPRO) 500 MG tablet; Take 1 tablet (500 mg total) by mouth 2 (two) times daily for 3 days.  Dispense: 6 tablet; Refill: 0  2. Urinary tract infection without hematuria, site unspecified  - ciprofloxacin (CIPRO) 500 MG tablet; Take 1 tablet (500 mg total) by mouth 2 (two) times daily for 3 days.  Dispense: 6 tablet; Refill: 0 - POCT urinalysis dipstick - Urine Culture  3. Hyperlipidemia, unspecified hyperlipidemia type  - fenofibrate (TRICOR) 48 MG tablet; TAKE 1 TABLET(48 MG) BY MOUTH DAILY  Dispense: 90 tablet; Refill: 1  4. Acute non-recurrent maxillary sinusitis   5. Allergic rhinitis due to pollen, unspecified seasonality  - fluticasone (FLONASE) 50 MCG/ACT nasal spray; Place 2 sprays into both nostrils daily.  Dispense: 16 g; Refill: 6  6. Type 2 diabetes mellitus without complication, without long-term current use of insulin (HCC)  - metFORMIN (GLUCOPHAGE-XR) 500 MG 24 hr tablet; Take 1 tablet (500 mg total) by mouth in the morning and at bedtime.  Dispense: 60 tablet; Refill: 5 - Semaglutide, 2 MG/DOSE, (OZEMPIC, 2 MG/DOSE,) 8 MG/3ML SOPN; Inject 2 mg into the skin once a week.  Dispense: 3 mL; Refill: 5  7. Nausea   8. Shortness of breath  - albuterol (PROAIR HFA) 108 (90 Base) MCG/ACT inhaler; Inhale 2 puffs into the lungs every 4 (four) hours as needed for wheezing or shortness of breath.  Dispense: 8.5 g; Refill: 3  9. Herpes simplex type 1 infection  - valACYclovir (VALTREX) 1000 MG tablet; Take 1 tablet (1,000 mg total) by mouth 2 (two) times daily.  Dispense: 20 tablet; Refill: 0  Follow up:  Follow up in 3 months

## 2022-11-07 ENCOUNTER — Ambulatory Visit: Payer: Medicaid Other | Admitting: Podiatry

## 2022-11-07 DIAGNOSIS — E119 Type 2 diabetes mellitus without complications: Secondary | ICD-10-CM | POA: Diagnosis not present

## 2022-11-07 DIAGNOSIS — B353 Tinea pedis: Secondary | ICD-10-CM | POA: Diagnosis not present

## 2022-11-07 MED ORDER — KETOCONAZOLE 2 % EX CREA: 1.0000 | TOPICAL_CREAM | Freq: Every day | CUTANEOUS | 2 refills | Status: AC

## 2022-11-07 NOTE — Progress Notes (Signed)
  Subjective:  Patient ID: Jessica Lawson, female    DOB: 1966/07/11,   MRN: [id number]  Chief Complaint  Patient presents with   Nail Problem    Pt stated that her primary care referred here because she is pre diabetic     57 y.o. female presents for diabetic foot check. Denies burning and tingling in their feet. Denies any specific complaints aside for some itching in her feet.  Patient is diabetic and last A1c was  Lab Results  Component Value Date   HGBA1C 6.6 (A) 10/10/2022   .   PCP:  Fenton Foy, NP    . Denies any other pedal complaints. Denies n/v/f/c.   Past Medical History:  Diagnosis Date   Allergy    Back pain    Diabetes mellitus without complication (HCC)    High blood cholesterol    Hypertension    Kidney stone    Normal cardiac stress test 02/2016   low risk   Obesity    Pain of cheek    and right side neck   Tobacco use    UTI (lower urinary tract infection)    Wrist pain    left wrist    Objective:  Physical Exam: Vascular: DP/PT pulses 2/4 bilateral. CFT <3 seconds. Normal hair growth on digits. No edema.  Skin. No lacerations or abrasions bilateral feet. Some scaling noted to the interspaces on bilateral feet.  Musculoskeletal: MMT 5/5 bilateral lower extremities in DF, PF, Inversion and Eversion. Deceased ROM in DF of ankle joint.  Neurological: Sensation intact to light touch. Protective sesnation intact.   Assessment:   1. Type 2 diabetes mellitus without complication, without long-term current use of insulin ([place])   2. Tinea pedis of both feet      Plan:  Patient was evaluated and treated and all questions answered. -Discussed and educated patient on diabetic foot care, especially with  regards to the vascular, neurological and musculoskeletal systems.  -Stressed the importance of good glycemic control and the detriment of not  controlling glucose levels in relation to the foot. -Discussed supportive shoes at all times and  checking feet regularly.  -ketoconazole provided for tinea.  -Answered all patient questions -Patient to return  in 1 year for DM foot check .  -Patient advised to call the office if any problems or questions arise in the meantime.   Lorenda Peck, DPM

## 2022-11-14 ENCOUNTER — Other Ambulatory Visit: Payer: Medicaid Other | Admitting: Pharmacist

## 2022-11-21 ENCOUNTER — Other Ambulatory Visit: Payer: Medicaid Other | Admitting: Pharmacist

## 2022-11-21 NOTE — Progress Notes (Unsigned)
11/21/2022 Name: Jessica Lawson MRN: [id number] DOB: 1965-11-25  Chief Complaint  Patient presents with   Medication Management   Diabetes    Jessica Lawson is a 57 y.o. year old female who presented for a telephone visit.   They were referred to the pharmacist by their PCP for assistance in managing diabetes.   Patient is participating in a Managed Medicaid Plan:  Yes  Subjective:  Care Team: Primary Care Provider: Ivonne Andrew, NP ; Next Scheduled Visit: 01/16/23 Cardiologist: Jacques Navy; Next Scheduled Visit: 01/16/23  Medication Access/Adherence  Current Pharmacy:  [id number] - [place], [place] - [address] AT [facility] OF [address] [place] [address] Phone: [phone] Fax: [phone]  [facility] - [place], [place] - [address] [place] [address] Phone: [phone] Fax: [phone]   Patient reports affordability concerns with their medications: No  Patient reports access/transportation concerns to their pharmacy: No  Patient reports adherence concerns with their medications:  No     Diabetes:  Current medications: metformin XR 500 mg daily, Ozempic 2 mg weekly  Current glucose readings: fastings and post prandials 110-120s  Does report periodic stomach upset, especially after supper and after fatty meals. Frustrated by reaching weight loss plateau  Patient denies hypoglycemic s/sx including dizziness, shakiness, sweating. Patient denies hyperglycemic symptoms including polyuria, polydipsia, polyphagia, nocturia, neuropathy, blurred vision.  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  Patient denies hypertensive symptoms including headache, chest pain, shortness of breath  Hyperlipidemia/ASCVD Risk Reduction  Current lipid lowering medications: rosuvastatin 10 mg daily, ezetimibe 10 mg daily, fenofibrate 48 mg daily, omega 3 fatty acid 1 g daily  Reports she takes omega 3 fatty acids and rosuvastatin at night, and reports stomach upset. Wonders if related to Ozempic or related to these medications.   Objective:  Lab Results  Component Value Date   HGBA1C 6.6 (A) 10/10/2022    Lab Results  Component Value Date   CREATININE 0.92 10/10/2022   BUN 14 10/10/2022   NA 142 10/10/2022   K 3.8 10/10/2022   CL 105 10/10/2022   CO2 23 10/10/2022    Lab Results  Component Value Date   CHOL 124 10/10/2022   HDL 37 (L) 10/10/2022   LDLCALC 61 10/10/2022   TRIG 148 10/10/2022   CHOLHDL 3.4 10/10/2022    Medications Reviewed Today     Reviewed by Alden Hipp, RPH-CPP (Pharmacist) on 11/21/22 at 1651  Med List Status: <None>   Medication Order Taking? Sig Documenting Provider Last Dose Status Informant  Accu-Chek Softclix Lancets lancets [id number]  USE AS DIRECTED FOUR TIMES DAILY. Barbette Merino, NP  Active   albuterol [facility]) [phone] Base) MCG/ACT inhaler [id number]  Inhale 2 puffs into the lungs every 4 (four) hours as needed for wheezing or shortness of breath. Ivonne Andrew, NP  Active   ANEFRIN NASAL SPRAY  0.05 % nasal spray [id number]  Place into both nostrils. [provider]  Active   ascorbic acid (VITAMIN C) 1000 MG tablet [id number]  Take 2,000 mg by mouth daily. [provider]  Active   blood glucose meter kit and supplies KIT [id number]   Dispense based on patient and insurance preference. Use up to four times daily as directed. (FOR ICD-9 250.00, 250.01). Ivonne AndrewNichols, Tonya S, NP  Active   Blood Glucose Monitoring Suppl (ACCU-CHEK GUIDE) w/Device KIT [id number]  Use to check your blood sugar daily Parke PoissonAcharya, Gayatri A, MD  Active   Blood Pressure Monitor DEVI [id number]  Use to check blood pressure daily. Ivonne AndrewNichols, Tonya S, NP  Active   Cholecalciferol (VITAMIN D3) 50 MCG (2000 UT) TABS [id number] No Take 1 tablet by mouth daily.  Patient not taking: Reported on 11/21/2022   [provider] Not Taking Active   ciclopirox (LOPROX) 0.77 % cream [id number]  Apply topically 2 (two) times daily. Raspet, Noberto RetortErin K, PA-C  Active   ciprofloxacin-dexamethasone (CIPRODEX) OTIC suspension [id number]  Place 4 drops into the right ear 2 (two) times daily.  Patient not taking: Reported on 10/10/2022   Jeani HawkingRaspet, Erin K, PA-C  Active   ezetimibe (ZETIA) 10 MG tablet [id number] Yes TAKE 1 TABLET(10 MG) BY MOUTH DAILY Ivonne AndrewNichols, Tonya S, NP Taking Active   fenofibrate (TRICOR) 48 MG tablet [id number] Yes TAKE 1 TABLET(48 MG) BY MOUTH DAILY Ivonne AndrewNichols, Tonya S, NP Taking Active   fexofenadine (ALLEGRA ALLERGY) 180 MG tablet [id number]  Take 1 tablet (180 mg total) by mouth daily. Ivonne AndrewNichols, Tonya S, NP  Active   fluconazole (DIFLUCAN) 150 MG tablet [id number]  Take 1 tablet (150 mg total) by mouth daily.  Patient not taking: Reported on 10/10/2022   Barbette MerinoKing, Crystal M, NP  Active   fluticasone [facility](FLONASE) 50 MCG/ACT nasal spray [id number]  Place 2 sprays into both nostrils daily. Ivonne AndrewNichols, Tonya S, NP  Active   glucose blood (ACCU-CHEK GUIDE) test strip [id number]  USE AS DIRECTED FOUR TIMES DAILY. Parke PoissonAcharya, Gayatri A, MD  Active   hydrochlorothiazide (HYDRODIURIL) 25 MG tablet [id number] Yes Take 1 tablet (25 mg total) by mouth daily. Ivonne AndrewNichols, Tonya S, NP Taking Active   ketoconazole (NIZORAL) 2 % cream [id number]  Apply 1 Application topically daily. Louann SjogrenSikora, Rebecca, DPM  Active    metFORMIN (GLUCOPHAGE-XR) 500 MG 24 hr tablet [id number] Yes Take 1 tablet (500 mg total) by mouth in the morning and at bedtime. Ivonne AndrewNichols, Tonya S, NP Taking Active            Med Note Clearance Coots(Undine Nealis, Taeko Schaffer T   Mon Nov 21, 2022  4:40 PM) Taking once in the morning  Multiple Vitamins-Minerals (MULTIVITAMIN WITH MINERALS) tablet [id number] Yes Take 1 tablet by mouth daily. [provider] Taking Active Self  nystatin cream (MYCOSTATIN) [id number]  Apply 1 application. topically 2 (two) times daily. Ivonne AndrewNichols, Tonya S, NP  Active   omega-3 acid ethyl esters (LOVAZA) 1 g capsule [id number] Yes TAKE 1 CAPSULE BY MOUTH EVERY DAY Ivonne AndrewNichols, Tonya S, NP Taking Active   Probiotic Product (PROBIOTIC-10 PO) [id number] Yes Take by mouth. [provider] Taking Active   rosuvastatin (CRESTOR) 10 MG tablet [id number] Yes Take 1 tablet (10 mg total) by mouth daily. Ivonne AndrewNichols, Tonya S, NP Taking Active   Semaglutide, 2 MG/DOSE, (OZEMPIC, 2 MG/DOSE,) 8 MG/3ML SOPN [id number] Yes Inject 2 mg into the skin once a week. Ivonne AndrewNichols, Tonya S, NP Taking Active   valACYclovir (VALTREX) 1000 MG tablet  [id number] Yes Take 1 tablet (1,000 mg total) by mouth 2 (two) times daily. Ivonne Andrew, NP Taking Active               Assessment/Plan:   Diabetes: - Currently controlled but with concerns with weight plateauing - Reviewed long term cardiovascular and renal outcomes of uncontrolled blood sugar - Reviewed goal A1c, goal fasting, and goal 2 hour post prandial glucose - Reviewed dietary modifications including: discussed nutritional pattern - Reviewed lifestyle modifications including: increasing physical activity. Reports she has an ECHO coming up and she wants that to be completed first before   - Recommend to check glucose periodically  Hypertension: - Currently controlled - Recommend to continue current regimen  Hyperlipidemia/ASCVD Risk Reduction: - Currently controlled but possible stomach upset from  fish oil - Recommend to continue current regimen, but can move rosuvastatin and Lovaza to morning administration to see if change in stomach upset. If continued stomach upset in the morning and she continues to believe to be related to Lovaza, recommend to discuss with cardiology at next appointment.  Follow Up Plan: PCP visit as scheduled  Catie Eppie Gibson, PharmD, BCACP, CPP [facility] [phone]

## 2022-12-14 ENCOUNTER — Other Ambulatory Visit: Payer: Self-pay | Admitting: Nurse Practitioner

## 2022-12-14 DIAGNOSIS — Z1231 Encounter for screening mammogram for malignant neoplasm of breast: Secondary | ICD-10-CM

## 2022-12-20 ENCOUNTER — Other Ambulatory Visit: Payer: Self-pay

## 2022-12-22 ENCOUNTER — Telehealth: Payer: Self-pay

## 2022-12-22 ENCOUNTER — Other Ambulatory Visit: Payer: Self-pay

## 2022-12-22 MED ORDER — HYDROCHLOROTHIAZIDE 25 MG PO TABS: 25.0000 mg | ORAL_TABLET | Freq: Every day | ORAL | 0 refills | Status: DC

## 2022-12-22 NOTE — Telephone Encounter (Signed)
Called pt to find out if she was interested in being scheduled for pap day 02/01/23. No answer and lvm. KH

## 2023-01-06 ENCOUNTER — Ambulatory Visit (HOSPITAL_COMMUNITY): Payer: Medicaid Other | Attending: Internal Medicine

## 2023-01-06 ENCOUNTER — Other Ambulatory Visit: Payer: Self-pay | Admitting: Nurse Practitioner

## 2023-01-06 DIAGNOSIS — E785 Hyperlipidemia, unspecified: Secondary | ICD-10-CM

## 2023-01-06 DIAGNOSIS — R0609 Other forms of dyspnea: Secondary | ICD-10-CM | POA: Insufficient documentation

## 2023-01-06 LAB — ECHOCARDIOGRAM COMPLETE
Area-P 1/2: 4.76 cm2
S' Lateral: 2.25 cm

## 2023-01-12 ENCOUNTER — Ambulatory Visit: Payer: Medicaid Other | Attending: Internal Medicine | Admitting: Internal Medicine

## 2023-01-12 VITALS — BP 122/84 | HR 71 | Ht 59.0 in | Wt 186.2 lb

## 2023-01-12 DIAGNOSIS — E782 Mixed hyperlipidemia: Secondary | ICD-10-CM | POA: Diagnosis not present

## 2023-01-12 DIAGNOSIS — M791 Myalgia, unspecified site: Secondary | ICD-10-CM

## 2023-01-12 DIAGNOSIS — I10 Essential (primary) hypertension: Secondary | ICD-10-CM | POA: Diagnosis not present

## 2023-01-12 DIAGNOSIS — Z7984 Long term (current) use of oral hypoglycemic drugs: Secondary | ICD-10-CM

## 2023-01-12 DIAGNOSIS — E785 Hyperlipidemia, unspecified: Secondary | ICD-10-CM | POA: Diagnosis not present

## 2023-01-12 DIAGNOSIS — R0609 Other forms of dyspnea: Secondary | ICD-10-CM | POA: Diagnosis not present

## 2023-01-12 DIAGNOSIS — R072 Precordial pain: Secondary | ICD-10-CM

## 2023-01-12 DIAGNOSIS — Z789 Other specified health status: Secondary | ICD-10-CM | POA: Diagnosis not present

## 2023-01-12 DIAGNOSIS — T466X5A Adverse effect of antihyperlipidemic and antiarteriosclerotic drugs, initial encounter: Secondary | ICD-10-CM

## 2023-01-12 DIAGNOSIS — E669 Obesity, unspecified: Secondary | ICD-10-CM | POA: Diagnosis not present

## 2023-01-12 DIAGNOSIS — E119 Type 2 diabetes mellitus without complications: Secondary | ICD-10-CM | POA: Diagnosis not present

## 2023-01-12 MED ORDER — OMEGA-3-ACID ETHYL ESTERS 1 G PO CAPS: 2.0000 g | ORAL_CAPSULE | Freq: Every day | ORAL | 2 refills | Status: AC

## 2023-01-12 NOTE — Progress Notes (Signed)
Cardiology Office Note:    Date:  01/12/2023   ID:  ORLENE Lawson, DOB 1966-08-03, MRN [id number]  PCP:  Ivonne Andrew, NP  Cardiologist:  Parke Poisson, MD  Electrophysiologist:  None   Referring MD: Ivonne Andrew, NP   Chief Complaint/Reason for Referral: Cardiovascular review, f/u  History of Present Illness:    Jessica Lawson is a 57 y.o. female with a history of DM 2 on Metformin, hyperlipidemia/hypertriglyceridemia, hypertension.  Overall doing well.  No chest pain or shortness of breath.  Did have weight loss initially with Ozempic but then had rebound weight gain.  She follows in our CVRR clinic for dosing.  Does have some GI symptoms with Ozempic but overall tolerable, A1c has improved, seems that therapy is helping.  Triglycerides have improved as measured in February and are now in the normal range 148.  She is on Lovaza 2 g, fenofibrate, Crestor 10 mg daily and Zetia, this is working well and her LDL is very well-controlled 61.  She had some nausea with twice daily dosing of Lovaza and is now taking both tablets in the morning.  The patient denies chest pain, chest pressure, dyspnea at rest or with exertion, palpitations, PND, orthopnea, or leg swelling. Denies cough, fever, chills. Denies vomiting. Denies syncope or presyncope. Denies dizziness or lightheadedness.    Past Medical History:  Diagnosis Date   Allergy    Back pain    Diabetes mellitus without complication (HCC)    High blood cholesterol    Hypertension    Kidney stone    Normal cardiac stress test 02/2016   low risk   Obesity    Pain of cheek    and right side neck   Tobacco use    UTI (lower urinary tract infection)    Wrist pain    left wrist    Past Surgical History:  Procedure Laterality Date   CESAREAN SECTION     3 c-sections   DENTAL SURGERY     teeth removed with meds to calm     Current Medications: Current Meds  Medication Sig   Accu-Chek Softclix Lancets  lancets USE AS DIRECTED FOUR TIMES DAILY.   albuterol (PROAIR HFA) 108 (90 Base) MCG/ACT inhaler Inhale 2 puffs into the lungs every 4 (four) hours as needed for wheezing or shortness of breath.   ANEFRIN NASAL SPRAY 0.05 % nasal spray Place into both nostrils.   ascorbic acid (VITAMIN C) 1000 MG tablet Take 2,000 mg by mouth daily.   blood glucose meter kit and supplies KIT Dispense based on patient and insurance preference. Use up to four times daily as directed. (FOR ICD-9 250.00, 250.01).   Blood Glucose Monitoring Suppl (ACCU-CHEK GUIDE) w/Device KIT Use to check your blood sugar daily   Blood Pressure Monitor DEVI Use to check blood pressure daily.   cetirizine (ZYRTEC) 10 MG tablet Take 10 mg by mouth daily.   Cholecalciferol (VITAMIN D3) 50 MCG (2000 UT) TABS Take 1 tablet by mouth daily.   ciclopirox (LOPROX) 0.77 % cream Apply topically 2 (two) times daily.   ciprofloxacin-dexamethasone (CIPRODEX) OTIC suspension Place 4 drops into the right ear 2 (two) times daily.   ezetimibe (ZETIA) 10 MG tablet TAKE 1 TABLET(10 MG) BY MOUTH DAILY   fenofibrate (TRICOR) 48 MG tablet TAKE 1 TABLET(48 MG) BY MOUTH DAILY   fluconazole (DIFLUCAN) 150 MG tablet Take 1 tablet (150 mg total) by mouth daily.   fluticasone (FLONASE) 50 MCG/ACT  nasal spray Place 2 sprays into both nostrils daily.   glucose blood (ACCU-CHEK GUIDE) test strip USE AS DIRECTED FOUR TIMES DAILY.   hydrochlorothiazide (HYDRODIURIL) 25 MG tablet Take 1 tablet (25 mg total) by mouth daily.   ketoconazole (NIZORAL) 2 % cream Apply 1 Application topically daily.   metFORMIN (GLUCOPHAGE-XR) 500 MG 24 hr tablet Take 1 tablet (500 mg total) by mouth in the morning and at bedtime.   Multiple Vitamins-Minerals (MULTIVITAMIN WITH MINERALS) tablet Take 1 tablet by mouth daily.   nystatin cream (MYCOSTATIN) Apply 1 application. topically 2 (two) times daily.   Probiotic Product (PROBIOTIC-10 PO) Take by mouth.   rosuvastatin (CRESTOR) 10 MG  tablet Take 1 tablet (10 mg total) by mouth daily.   Semaglutide, 2 MG/DOSE, (OZEMPIC, 2 MG/DOSE,) 8 MG/3ML SOPN Inject 2 mg into the skin once a week.   valACYclovir (VALTREX) 1000 MG tablet Take 1 tablet (1,000 mg total) by mouth 2 (two) times daily.   [DISCONTINUED] omega-3 acid ethyl esters (LOVAZA) 1 g capsule TAKE 1 CAPSULE BY MOUTH EVERY DAY   Reviewed in chart, unable to pull into note.  Allergies:   Asa [aspirin], Iohexol, Penicillins, Sulfa antibiotics, and Nystatin   Social History   Tobacco Use   Smoking status: Former    Packs/day: 1    Types: Cigarettes    Quit date: 07/26/2016    Years since quitting: 6.4   Smokeless tobacco: Never  Vaping Use   Vaping Use: Never used  Substance Use Topics   Alcohol use: No   Drug use: No     Family History: The patient's family history includes Diabetes in her mother, sister, sister, and another family member; Hyperlipidemia in an other family member; Hypertension in her sister and another family member; Lung disease in her father. There is no history of Colon cancer, Colon polyps, Esophageal cancer, Stomach cancer, or Rectal cancer.  ROS:   Please see the history of present illness.    All other systems reviewed and are negative.  EKGs/Labs/Other Studies Reviewed:    The following studies were reviewed today:  EKG:  NSR, Nonspecific T wave abnl, unchanged from prior  Recent Labs: 10/10/2022: ALT 46; BUN 14; Creatinine, Ser 0.92; Hemoglobin 13.9; Platelets 390; Potassium 3.8; Sodium 142  Recent Lipid Panel    Component Value Date/Time   CHOL 124 10/10/2022 1019   TRIG 148 10/10/2022 1019   HDL 37 (L) 10/10/2022 1019   CHOLHDL 3.4 10/10/2022 1019   CHOLHDL 7.3 (H) 07/12/2017 0950   VLDL 50 (H) 04/06/2017 0902   LDLCALC 61 10/10/2022 1019   LDLCALC 183 (H) 07/12/2017 0950    Physical Exam:    VS:  BP 122/84   Pulse 71   Ht 4\' 11"  (1.499 m)   Wt 186 lb 3.2 oz (84.5 kg)   LMP 08/22/2011 Comment: patient states LMP  20 years ago  SpO2 99%   BMI 37.61 kg/m     Wt Readings from Last 5 Encounters:  01/12/23 186 lb 3.2 oz (84.5 kg)  10/10/22 184 lb 3.2 oz (83.6 kg)  07/11/22 180 lb (81.6 kg)  06/29/22 182 lb 9.6 oz (82.8 kg)  06/17/22 181 lb (82.1 kg)    Constitutional: No acute distress Eyes: sclera non-icteric, normal conjunctiva and lids ENMT: normal dentition, moist mucous membranes Cardiovascular: regular rhythm, normal rate, no murmurs. S1 and S2 normal. Radial pulses normal bilaterally. No jugular venous distention.  Respiratory: clear to auscultation bilaterally GI : normal bowel sounds,  soft and nontender. No distention.   MSK: extremities warm, well perfused. No edema.  NEURO: grossly nonfocal exam, moves all extremities. PSYCH: alert and oriented x 3, normal mood and affect.   ASSESSMENT:    1. Hypercholesterolemia with hypertriglyceridemia   2. Hyperlipidemia, unspecified hyperlipidemia type   3. Dyspnea on exertion   4. Precordial pain   5. Essential hypertension   6. Myalgia due to statin   7. Statin intolerance   8. Type 2 diabetes mellitus without complication, without long-term current use of insulin (HCC)   9. Obesity (BMI 30-39.9)      PLAN:    Precordial pain - no recurrence. Thinks GI related. Follow, and stress test if symptoms. Consider PET-CT MPI if recurrent symptoms.  -Recent echo normal in setting of mildly abnormal EKG which is stable.  Myalgia due to statin Statin intolerance Hypercholesterolemia with hypertriglyceridemia -previously followed by lipid clinic. - continue crestor 10 mg daily - continue zetia 10 mg daily - continue fenofibrate 48 mg daily - continue lovaza 2 g daily.  - on ozempic, seems to have helped with diabetes management which in turn has helped with triglyceride management.  Weight loss has waxed and waned, but some of the benefits seems to warrant continuation.  Essential hypertension - stable BP on HCTZ 25 mg daily  DOE  -No  recurrent and echocardiogram normal.  Total time of encounter: 20 minutes total time of encounter, including 15 minutes spent in face-to-face patient care on the date of this encounter. This time includes coordination of care and counseling regarding above mentioned problem list. Remainder of non-face-to-face time involved reviewing chart documents/testing relevant to the patient encounter and documentation in the medical record. I have independently reviewed documentation from referring provider.   Weston Brass, MD, [facility] [place]  [facility]       Medication Adjustments/Labs and Tests Ordered: Current medicines are reviewed at length with the patient today.  Concerns regarding medicines are outlined above.   Orders Placed This Encounter  Procedures   EKG 12-Lead    Shared Decision Making/Informed Consent:     Meds ordered this encounter  Medications   omega-3 acid ethyl esters (LOVAZA) 1 g capsule    Sig: Take 2 capsules (2 g total) by mouth daily. TAKE 1 CAPSULE BY MOUTH EVERY DAY    Dispense:  30 capsule    Refill:  2     Patient Instructions  Medication Instructions:  No Changes In Medications at this time.   *If you need a refill on your cardiac medications before your next appointment, please call your pharmacy*  Follow-Up: At [facility], you and your health needs are our priority.  As part of our continuing mission to provide you with exceptional heart care, we have created designated Provider Care Teams.  These Care Teams include your primary Cardiologist (physician) and Advanced Practice Providers (APPs -  Physician Assistants and Nurse Practitioners) who all work together to provide you with the care you need, when you need it.  Your next appointment:   6 month(s)  Provider:   Parke Poisson, MD

## 2023-01-12 NOTE — Patient Instructions (Signed)
Medication Instructions:  No Changes In Medications at this time.  *If you need a refill on your cardiac medications before your next appointment, please call your pharmacy*  Follow-Up: At [facility], you and your health needs are our priority.  As part of our continuing mission to provide you with exceptional heart care, we have created designated Provider Care Teams.  These Care Teams include your primary Cardiologist (physician) and Advanced Practice Providers (APPs -  Physician Assistants and Nurse Practitioners) who all work together to provide you with the care you need, when you need it.  Your next appointment:   6 month(s)  Provider:   Gayatri A Acharya, MD    

## 2023-01-15 ENCOUNTER — Other Ambulatory Visit: Payer: Self-pay | Admitting: Nurse Practitioner

## 2023-01-16 ENCOUNTER — Ambulatory Visit: Payer: Self-pay | Admitting: Nurse Practitioner

## 2023-01-16 NOTE — Telephone Encounter (Signed)
Lvm for pt to call back and advise if she needs it. KH

## 2023-01-19 ENCOUNTER — Other Ambulatory Visit: Payer: Self-pay

## 2023-01-19 MED ORDER — FLUCONAZOLE 150 MG PO TABS: 150.0000 mg | ORAL_TABLET | Freq: Once | ORAL | 0 refills | Status: AC

## 2023-01-19 NOTE — Telephone Encounter (Signed)
Please advise KH 

## 2023-01-19 NOTE — Telephone Encounter (Signed)
From: Dorthula Nettles To: Office of Barbette Merino, [place] Sent: 01/19/2023 12:00 PM EDT Subject: Medication Renewal Request  Refills have been requested for the following medications:   fluconazole (DIFLUCAN) 150 MG tablet Archie Patten S Nichols]  Preferred pharmacy: [facility] #[id number] - [place], [place] - [address] AT [facility] OF [address] Delivery method: Daryll Drown

## 2023-01-20 ENCOUNTER — Ambulatory Visit
Admission: RE | Admit: 2023-01-20 | Discharge: 2023-01-20 | Disposition: A | Discharge status: Home / Self Care | Payer: Medicaid Other | Source: Ambulatory Visit

## 2023-01-20 DIAGNOSIS — Z1231 Encounter for screening mammogram for malignant neoplasm of breast: Secondary | ICD-10-CM | POA: Diagnosis not present

## 2023-01-29 ENCOUNTER — Other Ambulatory Visit: Payer: Self-pay | Admitting: Internal Medicine

## 2023-01-29 DIAGNOSIS — E119 Type 2 diabetes mellitus without complications: Secondary | ICD-10-CM

## 2023-02-13 ENCOUNTER — Other Ambulatory Visit: Payer: Self-pay | Admitting: Nurse Practitioner

## 2023-03-06 ENCOUNTER — Other Ambulatory Visit (HOSPITAL_COMMUNITY)
Admission: RE | Admit: 2023-03-06 | Discharge: 2023-03-06 | Disposition: A | Discharge status: Home / Self Care | Payer: Medicaid Other | Source: Ambulatory Visit | Attending: Nurse Practitioner | Admitting: Nurse Practitioner

## 2023-03-06 ENCOUNTER — Ambulatory Visit: Payer: Medicaid Other | Admitting: Nurse Practitioner

## 2023-03-06 ENCOUNTER — Encounter: Payer: Self-pay | Admitting: Nurse Practitioner

## 2023-03-06 VITALS — BP 116/89 | HR 85 | Temp 97.0°F | Wt 184.6 lb

## 2023-03-06 DIAGNOSIS — Z124 Encounter for screening for malignant neoplasm of cervix: Secondary | ICD-10-CM

## 2023-03-06 DIAGNOSIS — E119 Type 2 diabetes mellitus without complications: Secondary | ICD-10-CM | POA: Diagnosis not present

## 2023-03-06 DIAGNOSIS — M545 Low back pain, unspecified: Secondary | ICD-10-CM | POA: Diagnosis not present

## 2023-03-06 LAB — POCT GLYCOSYLATED HEMOGLOBIN (HGB A1C): Hemoglobin A1C: 6.8 % — AB (ref 4.0–5.6)

## 2023-03-06 LAB — POCT URINALYSIS DIP (CLINITEK)
Bilirubin, UA: NEGATIVE
Blood, UA: NEGATIVE
Glucose, UA: NEGATIVE mg/dL
Ketones, POC UA: NEGATIVE mg/dL
Leukocytes, UA: NEGATIVE
Nitrite, UA: NEGATIVE
POC PROTEIN,UA: NEGATIVE
Spec Grav, UA: >=1.03 — AB
Urobilinogen, UA: 0.2 U/dL
pH, UA: 5.5

## 2023-03-06 NOTE — Assessment & Plan Note (Signed)
-   POCT glycosylated hemoglobin (Hb A1C) - CBC - Comprehensive metabolic panel   2. Cervical cancer screening  - Cytology - PAP([place])   3. Acute bilateral low back pain without sciatica  - POCT URINALYSIS DIP (CLINITEK)    Follow up:  Follow up in 6 months

## 2023-03-06 NOTE — Progress Notes (Signed)
@Patient  ID: Jessica Lawson, female    DOB: [birth year], 57 y.o.   MRN: [id number]  Chief Complaint  Patient presents with   Follow-up    Referring provider: Ivonne Andrew, NP   HPI  Patient presents today for follow-up visit.  Overall she has been doing well since her last visit here. Has seen podiatry and will be following up in march of next year.  She states that she has been having some low back pain and would like to have her urine checked to make sure she does not have a UTI.  She is due for Pap smear today. Denies f/c/s, n/v/d, hemoptysis, PND, leg swelling Denies chest pain or edema        Allergies  Allergen Reactions   Asa [Aspirin] Anaphylaxis, Hives and Swelling   Iohexol Other (See Comments)     Desc: pt complains of difficulty swallowing and thickened tongue/ throat closes up    Penicillins Hives, Swelling and Other (See Comments)    Thrush    Sulfa Antibiotics Anaphylaxis, Hives, Swelling and Other (See Comments)   Nystatin Other (See Comments)    thrush    Immunization History  Administered Date(s) Administered   Influenza,inj,Quad PF,6+ Mos 05/22/2020, 05/20/2022   MMRV 06/18/2020   PFIZER(Purple Top)SARS-COV-2 Vaccination 02/07/2020, 02/28/2020   Pneumococcal Polysaccharide-23 09/03/2015   Tdap 11/01/2018    Past Medical History:  Diagnosis Date   Allergy    Back pain    Diabetes mellitus without complication (HCC)    High blood cholesterol    Hypertension    Kidney stone    Normal cardiac stress test 02/2016   low risk   Obesity    Pain of cheek    and right side neck   Tobacco use    UTI (lower urinary tract infection)    Wrist pain    left wrist    Tobacco History: Social History   Tobacco Use  Smoking Status Former   Current packs/day: 0.00   Types: Cigarettes   Quit date: 07/26/2016   Years since quitting: 6.6  Smokeless Tobacco Never   Counseling given: Not Answered   Outpatient Encounter Medications as of  03/06/2023  Medication Sig   Accu-Chek Softclix Lancets lancets USE AS DIRECTED FOUR TIMES DAILY.   albuterol (PROAIR HFA) 108 (90 Base) MCG/ACT inhaler Inhale 2 puffs into the lungs every 4 (four) hours as needed for wheezing or shortness of breath.   ANEFRIN NASAL SPRAY 0.05 % nasal spray Place into both nostrils.   ascorbic acid (VITAMIN C) 1000 MG tablet Take 2,000 mg by mouth daily.   blood glucose meter kit and supplies KIT Dispense based on patient and insurance preference. Use up to four times daily as directed. (FOR ICD-9 250.00, 250.01).   Blood Glucose Monitoring Suppl (ACCU-CHEK GUIDE) w/Device KIT Use to check your blood sugar daily   Blood Pressure Monitor DEVI Use to check blood pressure daily.   cetirizine (ZYRTEC) 10 MG tablet Take 10 mg by mouth daily.   Cholecalciferol (VITAMIN D3) 50 MCG (2000 UT) TABS Take 1 tablet by mouth daily.   ciclopirox (LOPROX) 0.77 % cream Apply topically 2 (two) times daily.   ciprofloxacin-dexamethasone (CIPRODEX) OTIC suspension Place 4 drops into the right ear 2 (two) times daily.   ezetimibe (ZETIA) 10 MG tablet TAKE 1 TABLET(10 MG) BY MOUTH DAILY   fenofibrate (TRICOR) 48 MG tablet TAKE 1 TABLET(48 MG) BY MOUTH DAILY   fexofenadine (ALLEGRA ALLERGY) 180 MG  tablet Take 1 tablet (180 mg total) by mouth daily.   fluticasone (FLONASE) 50 MCG/ACT nasal spray Place 2 sprays into both nostrils daily.   glucose blood (ACCU-CHEK GUIDE) test strip USE AS DIRECTED FOUR TIMES DAILY.   hydrochlorothiazide (HYDRODIURIL) 25 MG tablet Take 1 tablet (25 mg total) by mouth daily.   ketoconazole (NIZORAL) 2 % cream Apply 1 Application topically daily.   metFORMIN (GLUCOPHAGE-XR) 500 MG 24 hr tablet Take 1 tablet (500 mg total) by mouth in the morning and at bedtime.   Multiple Vitamins-Minerals (MULTIVITAMIN WITH MINERALS) tablet Take 1 tablet by mouth daily.   nystatin cream (MYCOSTATIN) Apply 1 application. topically 2 (two) times daily.   omega-3 acid ethyl  esters (LOVAZA) 1 g capsule Take 2 capsules (2 g total) by mouth daily. TAKE 1 CAPSULE BY MOUTH EVERY DAY   Probiotic Product (PROBIOTIC-10 PO) Take by mouth.   rosuvastatin (CRESTOR) 10 MG tablet TAKE 1 TABLET(10 MG) BY MOUTH DAILY   Semaglutide, 2 MG/DOSE, (OZEMPIC, 2 MG/DOSE,) 8 MG/3ML SOPN Inject 2 mg into the skin once a week.   valACYclovir (VALTREX) 1000 MG tablet Take 1 tablet (1,000 mg total) by mouth 2 (two) times daily.   No facility-administered encounter medications on file as of 03/06/2023.     Review of Systems  Review of Systems  Constitutional: Negative.   HENT: Negative.    Cardiovascular: Negative.   Gastrointestinal: Negative.   Allergic/Immunologic: Negative.   Neurological: Negative.   Psychiatric/Behavioral: Negative.         Physical Exam  BP 116/89   Pulse 85   Temp (!) 97 F (36.1 C)   Wt 184 lb 9.6 oz (83.7 kg)   LMP 08/22/2011 Comment: patient states LMP 20 years ago  SpO2 100%   BMI 37.28 kg/m   Wt Readings from Last 5 Encounters:  03/06/23 184 lb 9.6 oz (83.7 kg)  01/12/23 186 lb 3.2 oz (84.5 kg)  10/10/22 184 lb 3.2 oz (83.6 kg)  07/11/22 180 lb (81.6 kg)  06/29/22 182 lb 9.6 oz (82.8 kg)     Physical Exam Vitals and nursing note reviewed. Exam conducted with a chaperone present.  Constitutional:      General: She is not in acute distress.    Appearance: She is well-developed.  Cardiovascular:     Rate and Rhythm: Normal rate and regular rhythm.  Pulmonary:     Effort: Pulmonary effort is normal.     Breath sounds: Normal breath sounds.  Genitourinary:    General: Normal vulva.     Vagina: Normal.     Cervix: Normal.     Uterus: Normal.      Adnexa: Right adnexa normal and left adnexa normal.  Neurological:     Mental Status: She is alert and oriented to person, place, and time.      Lab Results:  CBC    Component Value Date/Time   WBC 6.6 10/10/2022 1019   WBC 6.5 05/18/2018 0855   RBC 4.87 10/10/2022 1019   RBC  5.08 05/18/2018 0855   HGB 13.9 10/10/2022 1019   HCT 41.6 10/10/2022 1019   PLT 390 10/10/2022 1019   MCV 85 10/10/2022 1019   MCH 28.5 10/10/2022 1019   MCH 28.1 05/18/2018 0855   MCHC 33.4 10/10/2022 1019   MCHC 33.6 05/18/2018 0855   RDW 13.9 10/10/2022 1019   LYMPHSABS 3.9 (H) 03/10/2021 1151   MONOABS 520 12/15/2016 1230   EOSABS 0.1 03/10/2021 1151   BASOSABS  0.0 03/10/2021 1151    BMET    Component Value Date/Time   NA 142 10/10/2022 1019   K 3.8 10/10/2022 1019   CL 105 10/10/2022 1019   CO2 23 10/10/2022 1019   GLUCOSE 127 (H) 10/10/2022 1019   GLUCOSE 141 (H) 05/18/2018 0855   BUN 14 10/10/2022 1019   CREATININE 0.92 10/10/2022 1019   CREATININE 0.82 12/15/2016 1230   CALCIUM 9.5 10/10/2022 1019   GFRNONAA 77 03/16/2020 1047   GFRNONAA 84 12/15/2016 1230   GFRAA 89 03/16/2020 1047   GFRAA >89 12/15/2016 1230      Assessment & Plan:   Type 2 diabetes mellitus without complication, without long-term current use of insulin (HCC) - POCT glycosylated hemoglobin (Hb A1C) - CBC - Comprehensive metabolic panel   2. Cervical cancer screening  - Cytology - PAP([place])   3. Acute bilateral low back pain without sciatica  - POCT URINALYSIS DIP ([facility])    Follow up:  Follow up in 6 months     Ivonne Andrew, NP 03/06/2023

## 2023-03-06 NOTE — Patient Instructions (Signed)
1. Type 2 diabetes mellitus without complication, without long-term current use of insulin (HCC)  - POCT glycosylated hemoglobin (Hb A1C) - CBC - Comprehensive metabolic panel   2. Cervical cancer screening  - Cytology - PAP([place])   3. Acute bilateral low back pain without sciatica  - POCT URINALYSIS DIP ([facility])    Follow up:  Follow up in 6 months

## 2023-03-07 LAB — COMPREHENSIVE METABOLIC PANEL
ALT: 65 IU/L — ABNORMAL HIGH (ref 0–32)
AST: 43 IU/L — ABNORMAL HIGH (ref 0–40)
Albumin: 4.8 g/dL (ref 3.8–4.9)
Alkaline Phosphatase: 56 IU/L (ref 44–121)
BUN/Creatinine Ratio: 20 (ref 9–23)
BUN: 17 mg/dL (ref 6–24)
Bilirubin Total: 0.3 mg/dL (ref 0.0–1.2)
CO2: 25 mmol/L (ref 20–29)
Calcium: 10.2 mg/dL (ref 8.7–10.2)
Chloride: 100 mmol/L (ref 96–106)
Creatinine, Ser: 0.83 mg/dL (ref 0.57–1.00)
Globulin, Total: 3.2 g/dL (ref 1.5–4.5)
Glucose: 125 mg/dL — ABNORMAL HIGH (ref 70–99)
Potassium: 3.7 mmol/L (ref 3.5–5.2)
Sodium: 141 mmol/L (ref 134–144)
Total Protein: 8 g/dL (ref 6.0–8.5)
eGFR: 82 mL/min/{1.73_m2} (ref >59)

## 2023-03-07 LAB — CBC
Hematocrit: 43.6 % (ref 34.0–46.6)
Hemoglobin: 14.3 g/dL (ref 11.1–15.9)
MCH: 27.9 pg (ref 26.6–33.0)
MCHC: 32.8 g/dL (ref 31.5–35.7)
MCV: 85 fL (ref 79–97)
Platelets: 358 10*3/uL (ref 150–450)
RBC: 5.13 x10E6/uL (ref 3.77–5.28)
RDW: 14 % (ref 11.7–15.4)
WBC: 7.2 10*3/uL (ref 3.4–10.8)

## 2023-03-08 ENCOUNTER — Other Ambulatory Visit: Payer: Self-pay | Admitting: Nurse Practitioner

## 2023-03-08 DIAGNOSIS — R748 Abnormal levels of other serum enzymes: Secondary | ICD-10-CM

## 2023-03-09 ENCOUNTER — Other Ambulatory Visit: Payer: Self-pay | Admitting: Nurse Practitioner

## 2023-03-09 DIAGNOSIS — L918 Other hypertrophic disorders of the skin: Secondary | ICD-10-CM

## 2023-03-09 LAB — CYTOLOGY - PAP: Diagnosis: NEGATIVE

## 2023-03-16 DIAGNOSIS — M5442 Lumbago with sciatica, left side: Secondary | ICD-10-CM | POA: Diagnosis not present

## 2023-03-16 DIAGNOSIS — M5441 Lumbago with sciatica, right side: Secondary | ICD-10-CM | POA: Diagnosis not present

## 2023-03-16 DIAGNOSIS — M5416 Radiculopathy, lumbar region: Secondary | ICD-10-CM | POA: Diagnosis not present

## 2023-03-16 DIAGNOSIS — M47816 Spondylosis without myelopathy or radiculopathy, lumbar region: Secondary | ICD-10-CM | POA: Diagnosis not present

## 2023-03-16 DIAGNOSIS — M549 Dorsalgia, unspecified: Secondary | ICD-10-CM | POA: Diagnosis not present

## 2023-03-16 DIAGNOSIS — M5136 Other intervertebral disc degeneration, lumbar region: Secondary | ICD-10-CM | POA: Diagnosis not present

## 2023-03-16 DIAGNOSIS — G8929 Other chronic pain: Secondary | ICD-10-CM | POA: Diagnosis not present

## 2023-03-22 ENCOUNTER — Encounter: Payer: Self-pay | Admitting: Physician Assistant

## 2023-03-23 ENCOUNTER — Other Ambulatory Visit: Payer: Self-pay | Admitting: Nurse Practitioner

## 2023-03-31 ENCOUNTER — Other Ambulatory Visit: Payer: Self-pay | Admitting: Nurse Practitioner

## 2023-03-31 MED ORDER — AZITHROMYCIN 250 MG PO TABS: ORAL_TABLET | ORAL | 0 refills | Status: AC

## 2023-04-07 DIAGNOSIS — M5116 Intervertebral disc disorders with radiculopathy, lumbar region: Secondary | ICD-10-CM | POA: Diagnosis not present

## 2023-04-07 DIAGNOSIS — M5416 Radiculopathy, lumbar region: Secondary | ICD-10-CM | POA: Diagnosis not present

## 2023-04-07 DIAGNOSIS — M545 Low back pain, unspecified: Secondary | ICD-10-CM | POA: Diagnosis not present

## 2023-04-07 DIAGNOSIS — M5117 Intervertebral disc disorders with radiculopathy, lumbosacral region: Secondary | ICD-10-CM | POA: Diagnosis not present

## 2023-04-11 ENCOUNTER — Other Ambulatory Visit: Payer: Self-pay

## 2023-04-11 MED ORDER — EZETIMIBE 10 MG PO TABS: ORAL_TABLET | ORAL | 2 refills | Status: DC

## 2023-05-02 DIAGNOSIS — R262 Difficulty in walking, not elsewhere classified: Secondary | ICD-10-CM | POA: Diagnosis not present

## 2023-05-02 DIAGNOSIS — M5416 Radiculopathy, lumbar region: Secondary | ICD-10-CM | POA: Diagnosis not present

## 2023-05-02 DIAGNOSIS — M6281 Muscle weakness (generalized): Secondary | ICD-10-CM | POA: Diagnosis not present

## 2023-05-16 ENCOUNTER — Other Ambulatory Visit: Payer: Self-pay | Admitting: Nurse Practitioner

## 2023-05-16 DIAGNOSIS — E119 Type 2 diabetes mellitus without complications: Secondary | ICD-10-CM

## 2023-05-16 MED ORDER — OZEMPIC (2 MG/DOSE) 8 MG/3ML ~~LOC~~ SOPN: 2.0000 mg | PEN_INJECTOR | SUBCUTANEOUS | 5 refills | Status: DC

## 2023-05-19 ENCOUNTER — Other Ambulatory Visit: Payer: Self-pay | Admitting: Nurse Practitioner

## 2023-05-19 MED ORDER — HYDROXYZINE HCL 10 MG PO TABS: 10.0000 mg | ORAL_TABLET | Freq: Three times a day (TID) | ORAL | 0 refills | Status: AC | PRN

## 2023-05-20 ENCOUNTER — Other Ambulatory Visit: Payer: Self-pay | Admitting: Nurse Practitioner

## 2023-06-08 ENCOUNTER — Ambulatory Visit (INDEPENDENT_AMBULATORY_CARE_PROVIDER_SITE_OTHER): Payer: Self-pay

## 2023-06-08 VITALS — BP 121/73 | HR 90

## 2023-06-08 DIAGNOSIS — Z23 Encounter for immunization: Secondary | ICD-10-CM | POA: Diagnosis not present

## 2023-06-08 NOTE — Progress Notes (Signed)
Vaccine was given in the right arm .  Pt  tolerated well.   Renelda Loma RMA

## 2023-06-18 ENCOUNTER — Other Ambulatory Visit: Payer: Self-pay | Admitting: Nurse Practitioner

## 2023-06-20 ENCOUNTER — Other Ambulatory Visit: Payer: Self-pay | Admitting: Nurse Practitioner

## 2023-06-20 NOTE — Addendum Note (Signed)
Addended by: Renelda Loma on: 06/20/2023 12:14 PM   Modules accepted: Level of Service

## 2023-06-23 ENCOUNTER — Other Ambulatory Visit: Payer: Self-pay

## 2023-06-23 DIAGNOSIS — E119 Type 2 diabetes mellitus without complications: Secondary | ICD-10-CM

## 2023-06-23 MED ORDER — METFORMIN HCL ER 500 MG PO TB24: 500.0000 mg | ORAL_TABLET | Freq: Two times a day (BID) | ORAL | 1 refills | Status: DC

## 2023-06-23 NOTE — Progress Notes (Deleted)
06/23/2023 Jessica Lawson [id number] [birth year]  Referring provider: Ivonne Andrew, NP Primary GI doctor: Dr. Lavon Paganini  ASSESSMENT AND PLAN:   Assessment and Plan              Patient Care Team: Ivonne Andrew, NP as PCP - General (Adult Health Nurse Practitioner) Parke Poisson, MD as PCP - Cardiology (Cardiology)  HISTORY OF PRESENT ILLNESS: 57 y.o. female with a past medical history of hypertension, type 2 diabetes, history of nephrolithiasis, history of tobacco use, hyperlipidemia and others listed below presents for evaluation of elevated liver function.   05/07/2019 colonoscopy Dr. Lavon Paganini due to positive Cologuard good bowel prep 2 polyps 1 to 2 mm transverse colon, 6 polyps 4 to 7 mm rectum rectosigmoid sigmoid, diverticulosis nonbleeding internal hemorrhoids.  Recall 7 years  04/08/2022 AST 26 ALT 39 unremarkable alk phos and total bili 10/10/2022 AST 46, AST 28 unremarkable alk phos and total bili 03/06/2023 AST 43, ALT 65 unremarkable alk phos and total bili CBC without anemia, normal platelets No abdominal imaging  Discussed the use of AI scribe software for clinical note transcription with the patient, who gave verbal consent to proceed.  History of Present Illness             She {Actions; denies-reports:120008} blood thinner use.  She {Actions; denies-reports:120008} NSAID use.  She {Actions; denies-reports:120008} ETOH use.   She {Actions; denies-reports:120008} tobacco use.  She {Actions; denies-reports:120008} drug use.    She  reports that she quit smoking about 6 years ago. Her smoking use included cigarettes. She has never used smokeless tobacco. She reports that she does not drink alcohol and does not use drugs.  RELEVANT LABS AND IMAGING:  Results          CBC    Component Value Date/Time   WBC 7.2 03/06/2023 1007   WBC 6.5 05/18/2018 0855   RBC 5.13 03/06/2023 1007   RBC 5.08 05/18/2018 0855   HGB 14.3 03/06/2023 1007    HCT 43.6 03/06/2023 1007   PLT 358 03/06/2023 1007   MCV 85 03/06/2023 1007   MCH 27.9 03/06/2023 1007   MCH 28.1 05/18/2018 0855   MCHC 32.8 03/06/2023 1007   MCHC 33.6 05/18/2018 0855   RDW 14.0 03/06/2023 1007   LYMPHSABS 3.9 (H) 03/10/2021 1151   MONOABS 520 12/15/2016 1230   EOSABS 0.1 03/10/2021 1151   BASOSABS 0.0 03/10/2021 1151   Recent Labs    10/10/22 1019 03/06/23 1007  HGB 13.9 14.3    CMP     Component Value Date/Time   NA 141 03/06/2023 1007   K 3.7 03/06/2023 1007   CL 100 03/06/2023 1007   CO2 25 03/06/2023 1007   GLUCOSE 125 (H) 03/06/2023 1007   GLUCOSE 141 (H) 05/18/2018 0855   BUN 17 03/06/2023 1007   CREATININE 0.83 03/06/2023 1007   CREATININE 0.82 12/15/2016 1230   CALCIUM 10.2 03/06/2023 1007   PROT 8.0 03/06/2023 1007   ALBUMIN 4.8 03/06/2023 1007   AST 43 (H) 03/06/2023 1007   ALT 65 (H) 03/06/2023 1007   ALKPHOS 56 03/06/2023 1007   BILITOT 0.3 03/06/2023 1007   GFRNONAA 77 03/16/2020 1047   GFRNONAA 84 12/15/2016 1230   GFRAA 89 03/16/2020 1047   GFRAA >89 12/15/2016 1230      Latest Ref Rng & Units 03/06/2023   10:07 AM 10/10/2022   10:19 AM 04/08/2022    4:09 PM  Hepatic Function  Total Protein  6.0 - 8.5 g/dL 8.0  7.0  7.9   Albumin 3.8 - 4.9 g/dL 4.8  4.4  4.9   AST 0 - 40 IU/L 43  28  26   ALT 0 - 32 IU/L 65  46  39   Alk Phosphatase 44 - 121 IU/L 56  48  49   Total Bilirubin 0.0 - 1.2 mg/dL 0.3  0.3  0.3       Current Medications:   Current Outpatient Medications (Endocrine & Metabolic):    metFORMIN (GLUCOPHAGE-XR) 500 MG 24 hr tablet, Take 1 tablet (500 mg total) by mouth in the morning and at bedtime.   Semaglutide, 2 MG/DOSE, (OZEMPIC, 2 MG/DOSE,) 8 MG/3ML SOPN, Inject 2 mg into the skin once a week.  Current Outpatient Medications (Cardiovascular):    ezetimibe (ZETIA) 10 MG tablet, TAKE 1 TABLET(10 MG) BY MOUTH DAILY   fenofibrate (TRICOR) 48 MG tablet, TAKE 1 TABLET(48 MG) BY MOUTH DAILY   hydrochlorothiazide  (HYDRODIURIL) 25 MG tablet, TAKE 1 TABLET(25 MG) BY MOUTH DAILY   omega-3 acid ethyl esters (LOVAZA) 1 g capsule, Take 2 capsules (2 g total) by mouth daily. TAKE 1 CAPSULE BY MOUTH EVERY DAY   rosuvastatin (CRESTOR) 10 MG tablet, TAKE 1 TABLET(10 MG) BY MOUTH DAILY  Current Outpatient Medications (Respiratory):    albuterol (PROAIR HFA) 108 (90 Base) MCG/ACT inhaler, Inhale 2 puffs into the lungs every 4 (four) hours as needed for wheezing or shortness of breath.   ANEFRIN NASAL SPRAY 0.05 % nasal spray, Place into both nostrils.   cetirizine (ZYRTEC) 10 MG tablet, Take 10 mg by mouth daily.   fexofenadine (ALLEGRA ALLERGY) 180 MG tablet, Take 1 tablet (180 mg total) by mouth daily.   fluticasone (FLONASE) 50 MCG/ACT nasal spray, Place 2 sprays into both nostrils daily.    Current Outpatient Medications (Other):    pregabalin (LYRICA) 25 MG capsule, Take 25 mg by mouth daily.   Accu-Chek Softclix Lancets lancets, USE AS DIRECTED FOUR TIMES DAILY.   ascorbic acid (VITAMIN C) 1000 MG tablet, Take 2,000 mg by mouth daily.   blood glucose meter kit and supplies KIT, Dispense based on patient and insurance preference. Use up to four times daily as directed. (FOR ICD-9 250.00, 250.01).   Blood Glucose Monitoring Suppl (ACCU-CHEK GUIDE) w/Device KIT, Use to check your blood sugar daily   Blood Pressure Monitor DEVI, Use to check blood pressure daily.   Cholecalciferol (VITAMIN D3) 50 MCG (2000 UT) TABS, Take 1 tablet by mouth daily.   ciclopirox (LOPROX) 0.77 % cream, Apply topically 2 (two) times daily.   ciprofloxacin-dexamethasone (CIPRODEX) OTIC suspension, Place 4 drops into the right ear 2 (two) times daily.   glucose blood (ACCU-CHEK GUIDE) test strip, USE AS DIRECTED FOUR TIMES DAILY.   hydrOXYzine (ATARAX) 10 MG tablet, Take 1 tablet (10 mg total) by mouth 3 (three) times daily as needed.   ketoconazole (NIZORAL) 2 % cream, Apply 1 Application topically daily.   Multiple  Vitamins-Minerals (MULTIVITAMIN WITH MINERALS) tablet, Take 1 tablet by mouth daily.   nystatin cream (MYCOSTATIN), Apply 1 application. topically 2 (two) times daily.   Probiotic Product (PROBIOTIC-10 PO), Take by mouth.   valACYclovir (VALTREX) 1000 MG tablet, Take 1 tablet (1,000 mg total) by mouth 2 (two) times daily.  Medical History:  Past Medical History:  Diagnosis Date   Allergy    Back pain    Diabetes mellitus without complication (HCC)    High blood cholesterol  Hypertension    Kidney stone    Normal cardiac stress test 02/2016   low risk   Obesity    Pain of cheek    and right side neck   Tobacco use    UTI (lower urinary tract infection)    Wrist pain    left wrist   Allergies:  Allergies  Allergen Reactions   Asa [Aspirin] Anaphylaxis, Hives and Swelling   Iohexol Other (See Comments)     Desc: pt complains of difficulty swallowing and thickened tongue/ throat closes up    Penicillins Hives, Swelling and Other (See Comments)    Thrush    Sulfa Antibiotics Anaphylaxis, Hives, Swelling and Other (See Comments)   Nystatin Other (See Comments)    thrush     Surgical History:  She  has a past surgical history that includes Cesarean section and Dental surgery. Family History:  Her family history includes Diabetes in her mother, sister, sister, and another family member; Hyperlipidemia in an other family member; Hypertension in her sister and another family member; Lung disease in her father.  REVIEW OF SYSTEMS  : All other systems reviewed and negative except where noted in the History of Present Illness.  PHYSICAL EXAM: LMP 08/22/2011 Comment: patient states LMP 20 years ago General Appearance: Well nourished, in no apparent distress. Head:   Normocephalic and atraumatic. Eyes:  sclerae anicteric,conjunctive pink  Respiratory: Respiratory effort normal, BS equal bilaterally without rales, rhonchi, wheezing. Cardio: RRR with no MRGs. Peripheral pulses  intact.  Abdomen: Soft,  {BlankSingle:19197::"Flat","Obese","Non-distended"} ,active bowel sounds. {actendernessAB:27319} tenderness {anatomy; site abdomen:5010}. {BlankMultiple:19196::"Without guarding","With guarding","Without rebound","With rebound"}. No masses. Rectal: {acrectalexam:27461} Musculoskeletal: Full ROM, {PSY - GAIT AND STATION:22860} gait. {With/Without:[id number]} edema. Skin:  Dry and intact without significant lesions or rashes Neuro: Alert and  oriented x4;  No focal deficits. Psych:  Cooperative. Normal mood and affect.    Doree Albee, PA-C 3:54 PM

## 2023-06-26 ENCOUNTER — Ambulatory Visit: Payer: Medicaid Other | Admitting: Physician Assistant

## 2023-07-07 ENCOUNTER — Other Ambulatory Visit: Payer: Self-pay | Admitting: Nurse Practitioner

## 2023-07-07 DIAGNOSIS — E785 Hyperlipidemia, unspecified: Secondary | ICD-10-CM

## 2023-07-19 DIAGNOSIS — H5213 Myopia, bilateral: Secondary | ICD-10-CM | POA: Diagnosis not present

## 2023-07-19 LAB — HM DIABETES EYE EXAM

## 2023-07-24 ENCOUNTER — Encounter: Payer: Self-pay | Admitting: Internal Medicine

## 2023-07-24 ENCOUNTER — Ambulatory Visit: Payer: Medicaid Other | Attending: Internal Medicine | Admitting: Internal Medicine

## 2023-07-24 VITALS — BP 104/70 | HR 85 | Ht 59.0 in | Wt 181.0 lb

## 2023-07-24 DIAGNOSIS — R072 Precordial pain: Secondary | ICD-10-CM

## 2023-07-24 DIAGNOSIS — I10 Essential (primary) hypertension: Secondary | ICD-10-CM

## 2023-07-24 DIAGNOSIS — Z789 Other specified health status: Secondary | ICD-10-CM

## 2023-07-24 DIAGNOSIS — T466X5D Adverse effect of antihyperlipidemic and antiarteriosclerotic drugs, subsequent encounter: Secondary | ICD-10-CM

## 2023-07-24 DIAGNOSIS — M791 Myalgia, unspecified site: Secondary | ICD-10-CM | POA: Diagnosis not present

## 2023-07-24 DIAGNOSIS — E785 Hyperlipidemia, unspecified: Secondary | ICD-10-CM | POA: Diagnosis not present

## 2023-07-24 NOTE — Progress Notes (Signed)
Cardiology Office Note:    Date:  07/24/2023   ID:  SHALETHA Lawson, DOB 1965-11-12, MRN [id number]  PCP:  Ivonne Andrew, NP  Cardiologist:  Parke Poisson, MD  Electrophysiologist:  None   Referring MD: Ivonne Andrew, NP   Chief Complaint/Reason for Referral: Cardiovascular review, f/u  History of Present Illness:    Jessica Lawson is a 57 y.o. female with a history of DM 2 on Metformin, hyperlipidemia/hypertriglyceridemia, hypertension.  Discussed the use of AI scribe software for clinical note transcription with the patient, who gave verbal consent to proceed.  History of Present Illness   The patient, with a history of diabetes, hyperlipidemia, and hypertension, presents for a follow-up visit. She reports no chest pain or shortness of breath. She has been trying to increase her physical activity, gradually increasing her walking time from 10 to 30 minutes over five days a week. She has also noticed a slight weight loss, from 186 to 181 pounds since her last visit.  The patient is currently on Ozempic 2mg  for diabetes management. She reports that the medication is working but causing nausea, particularly after consuming fried foods. She manages the nausea with ginger chews. She also reports that certain foods, like broccoli and salads, cause sulfur burps. She is considering reducing the Ozempic dosage due to these side effects.  For hyperlipidemia, she is on Crestor 10mg , Zetia 10mg , fenofibrate 48mg , and Lovaza 2mg  three times a week. She reports no issues with these medications. Her last cholesterol check was in February, and she is due for another check next month.  For hypertension, she is on hydrochlorothiazide 25mg  daily. Her blood pressure at this visit is 104/70, which is slightly low. She reports no symptoms of dizziness or lightheadedness.      Prior Overall doing well.  No chest pain or shortness of breath.  Did have weight loss initially with Ozempic but  then had rebound weight gain.  She follows in our CVRR clinic for dosing.  Does have some GI symptoms with Ozempic but overall tolerable, A1c has improved, seems that therapy is helping.  Triglycerides have improved as measured in February and are now in the normal range 148.  She is on Lovaza 2 g, fenofibrate, Crestor 10 mg daily and Zetia, this is working well and her LDL is very well-controlled 61.  She had some nausea with twice daily dosing of Lovaza and is now taking both tablets in the morning.  The patient denies chest pain, chest pressure, dyspnea at rest or with exertion, palpitations, PND, orthopnea, or leg swelling. Denies cough, fever, chills. Denies vomiting. Denies syncope or presyncope. Denies dizziness or lightheadedness.    Past Medical History:  Diagnosis Date   Allergy    Back pain    Diabetes mellitus without complication (HCC)    High blood cholesterol    Hypertension    Kidney stone    Normal cardiac stress test 02/2016   low risk   Obesity    Pain of cheek    and right side neck   Tobacco use    UTI (lower urinary tract infection)    Wrist pain    left wrist    Past Surgical History:  Procedure Laterality Date   CESAREAN SECTION     3 c-sections   DENTAL SURGERY     teeth removed with meds to calm     Current Medications: Current Meds  Medication Sig   Accu-Chek Softclix Lancets lancets USE  AS DIRECTED FOUR TIMES DAILY.   albuterol (PROAIR HFA) 108 (90 Base) MCG/ACT inhaler Inhale 2 puffs into the lungs every 4 (four) hours as needed for wheezing or shortness of breath.   ANEFRIN NASAL SPRAY 0.05 % nasal spray Place into both nostrils.   ascorbic acid (VITAMIN C) 1000 MG tablet Take 2,000 mg by mouth daily.   blood glucose meter kit and supplies KIT Dispense based on patient and insurance preference. Use up to four times daily as directed. (FOR ICD-9 250.00, 250.01).   Blood Glucose Monitoring Suppl (ACCU-CHEK GUIDE) w/Device KIT Use to check your  blood sugar daily   Blood Pressure Monitor DEVI Use to check blood pressure daily.   cetirizine (ZYRTEC) 10 MG tablet Take 10 mg by mouth daily.   Cholecalciferol (VITAMIN D3) 50 MCG (2000 UT) TABS Take 1 tablet by mouth daily.   ciclopirox (LOPROX) 0.77 % cream Apply topically 2 (two) times daily.   ciprofloxacin-dexamethasone (CIPRODEX) OTIC suspension Place 4 drops into the right ear 2 (two) times daily.   ezetimibe (ZETIA) 10 MG tablet TAKE 1 TABLET(10 MG) BY MOUTH DAILY   fenofibrate (TRICOR) 48 MG tablet TAKE 1 TABLET(48 MG) BY MOUTH DAILY   fluticasone (FLONASE) 50 MCG/ACT nasal spray Place 2 sprays into both nostrils daily.   gabapentin (NEURONTIN) 100 MG capsule Take 1 capsule by mouth at bedtime.   glucose blood (ACCU-CHEK GUIDE) test strip USE AS DIRECTED FOUR TIMES DAILY.   hydrochlorothiazide (HYDRODIURIL) 25 MG tablet TAKE 1 TABLET(25 MG) BY MOUTH DAILY   hydrOXYzine (ATARAX) 10 MG tablet Take 1 tablet (10 mg total) by mouth 3 (three) times daily as needed.   ketoconazole (NIZORAL) 2 % cream Apply 1 Application topically daily.   metFORMIN (GLUCOPHAGE-XR) 500 MG 24 hr tablet Take 1 tablet (500 mg total) by mouth in the morning and at bedtime.   Multiple Vitamins-Minerals (MULTIVITAMIN WITH MINERALS) tablet Take 1 tablet by mouth daily.   nystatin cream (MYCOSTATIN) Apply 1 application. topically 2 (two) times daily.   omega-3 acid ethyl esters (LOVAZA) 1 g capsule Take 2 capsules (2 g total) by mouth daily. TAKE 1 CAPSULE BY MOUTH EVERY DAY   pregabalin (LYRICA) 25 MG capsule Take 25 mg by mouth daily.   Probiotic Product (PROBIOTIC-10 PO) Take by mouth.   rosuvastatin (CRESTOR) 10 MG tablet TAKE 1 TABLET(10 MG) BY MOUTH DAILY   Semaglutide, 2 MG/DOSE, (OZEMPIC, 2 MG/DOSE,) 8 MG/3ML SOPN Inject 2 mg into the skin once a week.   valACYclovir (VALTREX) 1000 MG tablet Take 1 tablet (1,000 mg total) by mouth 2 (two) times daily.   Reviewed in chart, unable to pull into  note.  Allergies:   Asa [aspirin], Iohexol, Penicillins, Sulfa antibiotics, and Nystatin   Social History   Tobacco Use   Smoking status: Former    Current packs/day: 0.00    Types: Cigarettes    Quit date: 07/26/2016    Years since quitting: 6.9   Smokeless tobacco: Never  Vaping Use   Vaping status: Never Used  Substance Use Topics   Alcohol use: No   Drug use: No     Family History: The patient's family history includes Diabetes in her mother, sister, sister, and another family member; Hyperlipidemia in an other family member; Hypertension in her sister and another family member; Lung disease in her father. There is no history of Colon cancer, Colon polyps, Esophageal cancer, Stomach cancer, Rectal cancer, or Breast cancer.  ROS:   Please see the  history of present illness.    All other systems reviewed and are negative.  EKGs/Labs/Other Studies Reviewed:    The following studies were reviewed today:  EKG:  EKG Interpretation Date/Time:  Monday July 24 2023 13:45:00 EST Ventricular Rate:  85 PR Interval:  150 QRS Duration:  82 QT Interval:  372 QTC Calculation: 442 R Axis:   24  Text Interpretation: Normal sinus rhythm Nonspecific T wave abnormality Confirmed by Weston Brass ([id number]) on 07/24/2023 2:23:22 PM    Recent Labs: 03/06/2023: ALT 65; BUN 17; Creatinine, Ser 0.83; Hemoglobin 14.3; Platelets 358; Potassium 3.7; Sodium 141  Recent Lipid Panel    Component Value Date/Time   CHOL 124 10/10/2022 1019   TRIG 148 10/10/2022 1019   HDL 37 (L) 10/10/2022 1019   CHOLHDL 3.4 10/10/2022 1019   CHOLHDL 7.3 (H) 07/12/2017 0950   VLDL 50 (H) 04/06/2017 0902   LDLCALC 61 10/10/2022 1019   LDLCALC 183 (H) 07/12/2017 0950    Physical Exam:    VS:  BP 104/70 (BP Location: Right Arm, Patient Position: Sitting, Cuff Size: Large)   Pulse 85   Ht 4\' 11"  (1.499 m)   Wt 181 lb (82.1 kg)   LMP 08/22/2011 Comment: patient states LMP 20 years ago  SpO2 96%   BMI  36.56 kg/m     Wt Readings from Last 5 Encounters:  07/24/23 181 lb (82.1 kg)  03/06/23 184 lb 9.6 oz (83.7 kg)  01/12/23 186 lb 3.2 oz (84.5 kg)  10/10/22 184 lb 3.2 oz (83.6 kg)  07/11/22 180 lb (81.6 kg)    Constitutional: No acute distress Eyes: sclera non-icteric, normal conjunctiva and lids ENMT: normal dentition, moist mucous membranes Cardiovascular: regular rhythm, normal rate, no murmurs. S1 and S2 normal. Radial pulses normal bilaterally. No jugular venous distention.  Respiratory: clear to auscultation bilaterally GI : normal bowel sounds, soft and nontender. No distention.   MSK: extremities warm, well perfused. No edema.  NEURO: grossly nonfocal exam, moves all extremities. PSYCH: alert and oriented x 3, normal mood and affect.   ASSESSMENT:    1. Essential hypertension   2. Myalgia due to statin   3. Precordial pain   4. Statin intolerance   5. Hyperlipidemia, unspecified hyperlipidemia type      PLAN:    Assessment and Plan    Diabetes Mellitus Hemoglobin A1c of 6.8, indicating good control. Patient is on Metformin extended release and Ozempic 2mg , which is causing tolerable nausea and decreased appetite, particularly with fried foods. -Continue current regimen. -Consider dose reduction if nausea becomes intolerable.  Hyperlipidemia Last cholesterol panel in February showed improvement. Patient is on Rosuvastatin 10mg , Zetia 10mg , Fenofibrate 48mg , and Lovaza 2mg  three times a week. -Continue current regimen. -Recommend cholesterol panel be checked at next primary care visit.  Hypertension Blood pressure is well-controlled on Hydrochlorothiazide 25mg  daily, with today's reading at 104/70. Patient has lost weight and is continuing to exercise. -Continue current regimen. -Consider dose reduction if patient experiences symptoms of hypotension or if weight loss continues.  General Health Maintenance / Followup Plans -Encourage continuation of exercise  regimen. -Schedule follow-up visit in six months.        Precordial pain - no recurrence. Thinks GI related. Follow, and stress test if symptoms. Consider PET-CT MPI if recurrent symptoms.  -Recent echo normal in setting of mildly abnormal EKG which is stable.  Myalgia due to statin Statin intolerance Hypercholesterolemia with hypertriglyceridemia -previously followed by lipid clinic. - continue crestor 10 mg  daily - continue zetia 10 mg daily - continue fenofibrate 48 mg daily - continue lovaza 2 g three times weekly.  - on ozempic, seems to have helped with diabetes management which in turn has helped with triglyceride management.  Weight loss has waxed and waned, but some of the benefits seems to warrant continuation.    Total time of encounter: 20 minutes total time of encounter, including 15 minutes spent in face-to-face patient care on the date of this encounter. This time includes coordination of care and counseling regarding above mentioned problem list. Remainder of non-face-to-face time involved reviewing chart documents/testing relevant to the patient encounter and documentation in the medical record. I have independently reviewed documentation from referring provider.   Weston Brass, MD, [facility]  [facility]       Medication Adjustments/Labs and Tests Ordered: Current medicines are reviewed at length with the patient today.  Concerns regarding medicines are outlined above.   Orders Placed This Encounter  Procedures   EKG 12-Lead    Shared Decision Making/Informed Consent:     No orders of the defined types were placed in this encounter.    Patient Instructions  Medication Instructions:  Your physician recommends that you continue on your current medications as directed. Please refer to the Current Medication list given to you today.  *If you need a refill on your cardiac medications before your next appointment, please call your  pharmacy*  Lab Work: None  Follow-Up: At [facility], you and your health needs are our priority.  As part of our continuing mission to provide you with exceptional heart care, we have created designated Provider Care Teams.  These Care Teams include your primary Cardiologist (physician) and Advanced Practice Providers (APPs -  Physician Assistants and Nurse Practitioners) who all work together to provide you with the care you need, when you need it.  Your next appointment:   6 month(s)  Provider:   Parke Poisson, MD

## 2023-07-24 NOTE — Patient Instructions (Signed)
Medication Instructions:  Your physician recommends that you continue on your current medications as directed. Please refer to the Current Medication list given to you today.  *If you need a refill on your cardiac medications before your next appointment, please call your pharmacy*  Lab Work: None  Follow-Up: At [facility], you and your health needs are our priority.  As part of our continuing mission to provide you with exceptional heart care, we have created designated Provider Care Teams.  These Care Teams include your primary Cardiologist (physician) and Advanced Practice Providers (APPs -  Physician Assistants and Nurse Practitioners) who all work together to provide you with the care you need, when you need it.  Your next appointment:   6 month(s)  Provider:   Parke Poisson, MD

## 2023-08-02 NOTE — Telephone Encounter (Signed)
Care team updated Jessica Lawson) - already abstracted into HM

## 2023-08-07 ENCOUNTER — Encounter: Payer: Self-pay | Admitting: Internal Medicine

## 2023-08-08 NOTE — Telephone Encounter (Signed)
Spoke to patient, advised some dietary changes that will help with the side effect that she is experiencing.  Avoid foods high in sulfur, like eggs and cheese.  try to eat smaller, more frequent meals, and eating slowly.   Patient will implement these changes before considering lower dose as current dose is keeping her BG in good range.

## 2023-08-21 ENCOUNTER — Other Ambulatory Visit: Payer: Self-pay

## 2023-08-21 ENCOUNTER — Telehealth: Payer: Self-pay

## 2023-08-21 NOTE — Telephone Encounter (Signed)
 Pharmacy Patient Advocate Encounter  Received notification from [facility] that Prior Authorization for FEXOFENADINE 180MG  has been APPROVED from 08/21/2023 to 08/20/2024   PA #/Case ID/Reference #: [id number]

## 2023-08-24 ENCOUNTER — Other Ambulatory Visit: Payer: Self-pay | Admitting: Nurse Practitioner

## 2023-08-24 DIAGNOSIS — B009 Herpesviral infection, unspecified: Secondary | ICD-10-CM

## 2023-08-25 ENCOUNTER — Other Ambulatory Visit: Payer: Self-pay | Admitting: Nurse Practitioner

## 2023-08-25 DIAGNOSIS — B009 Herpesviral infection, unspecified: Secondary | ICD-10-CM

## 2023-08-25 MED ORDER — VALACYCLOVIR HCL 1 G PO TABS: 1000.0000 mg | ORAL_TABLET | Freq: Two times a day (BID) | ORAL | 0 refills | Status: DC

## 2023-08-28 NOTE — Telephone Encounter (Signed)
 Copied from CRM [id number]. Topic: Clinical - Prescription Issue >> Aug 28, 2023  2:36 PM Curlee DEL wrote: Reason for CRM: Patient would like to know if there's a lower dosage of the Allegra  that can be called into her pharmacy - she typically takes this medication in the morning and she reports it makes her very sluggish and her heart rate go up.

## 2023-09-11 ENCOUNTER — Ambulatory Visit (INDEPENDENT_AMBULATORY_CARE_PROVIDER_SITE_OTHER): Payer: Medicaid Other | Admitting: Nurse Practitioner

## 2023-09-11 VITALS — BP 122/83 | HR 86 | Temp 97.0°F | Wt 180.4 lb

## 2023-09-11 DIAGNOSIS — Z13220 Encounter for screening for lipoid disorders: Secondary | ICD-10-CM

## 2023-09-11 DIAGNOSIS — E119 Type 2 diabetes mellitus without complications: Secondary | ICD-10-CM

## 2023-09-11 DIAGNOSIS — R109 Unspecified abdominal pain: Secondary | ICD-10-CM

## 2023-09-11 LAB — POCT URINALYSIS DIP (CLINITEK)
Bilirubin, UA: NEGATIVE
Blood, UA: NEGATIVE
Glucose, UA: NEGATIVE mg/dL
Ketones, POC UA: NEGATIVE mg/dL
Leukocytes, UA: NEGATIVE
Nitrite, UA: NEGATIVE
POC PROTEIN,UA: NEGATIVE
Spec Grav, UA: 1.025 (ref 1.010–1.025)
Urobilinogen, UA: 0.2 U/dL
pH, UA: 6 (ref 5.0–8.0)

## 2023-09-11 LAB — POCT GLYCOSYLATED HEMOGLOBIN (HGB A1C): Hemoglobin A1C: 6.4 % — AB (ref 4.0–5.6)

## 2023-09-11 MED ORDER — FEXOFENADINE HCL 60 MG PO TABS: 60.0000 mg | ORAL_TABLET | Freq: Two times a day (BID) | ORAL | 2 refills | Status: AC

## 2023-09-11 NOTE — Patient Instructions (Signed)
1. Type 2 diabetes mellitus without complication, without long-term current use of insulin (HCC) (Primary)  - POCT glycosylated hemoglobin (Hb A1C) - CBC - Comprehensive metabolic panel   2. Lipid screening  - Lipid Panel   Follow up:  Follow up in 3 months

## 2023-09-11 NOTE — Progress Notes (Signed)
Subjective   Patient ID: Jessica Lawson, female    DOB: [birth year], 58 y.o.   MRN: [id number]  Chief Complaint  Patient presents with   Diabetes    Patient stated that her numbers have been really good    Referring provider: Ivonne Andrew, NP  MICHAELAH CREDEUR is a 58 y.o. female with Past Medical History: No date: Allergy No date: Back pain No date: Diabetes mellitus without complication (HCC) No date: High blood cholesterol No date: Hypertension No date: Kidney stone 02/2016: Normal cardiac stress test     Comment:  low risk No date: Obesity No date: Pain of cheek     Comment:  and right side neck No date: Tobacco use No date: UTI (lower urinary tract infection) No date: Wrist pain     Comment:  left wrist   HPI   Patient presents today for follow-up visit.  Overall she has been doing well since her last visit here. Has seen podiatry and will be following up in march. A1C in office today is 6.4. Patient does complain of some flank pain over he past few days. Will check UA.  Denies f/c/s, n/v/d, hemoptysis, PND, leg swelling. Denies chest pain or edema.     Allergies  Allergen Reactions   Asa [Aspirin] Anaphylaxis, Hives and Swelling   Iohexol Other (See Comments)     Desc: pt complains of difficulty swallowing and thickened tongue/ throat closes up    Penicillins Hives, Swelling and Other (See Comments)    Thrush    Sulfa Antibiotics Anaphylaxis, Hives, Swelling and Other (See Comments)   Nystatin Other (See Comments)    thrush    Immunization History  Administered Date(s) Administered   Influenza, Seasonal, Injecte, Preservative Fre 06/08/2023   Influenza,inj,Quad PF,6+ Mos 05/22/2020, 05/20/2022   MMRV 06/18/2020   PFIZER(Purple Top)SARS-COV-2 Vaccination 02/07/2020, 02/28/2020   Pneumococcal Polysaccharide-23 09/03/2015   Tdap 11/01/2018    Tobacco History: Social History   Tobacco Use  Smoking Status Former   Current packs/day: 0.00    Types: Cigarettes   Quit date: 07/26/2016   Years since quitting: 7.1  Smokeless Tobacco Never   Counseling given: Not Answered   Outpatient Encounter Medications as of 09/11/2023  Medication Sig   Accu-Chek Softclix Lancets lancets USE AS DIRECTED FOUR TIMES DAILY.   albuterol (PROAIR HFA) 108 (90 Base) MCG/ACT inhaler Inhale 2 puffs into the lungs every 4 (four) hours as needed for wheezing or shortness of breath.   ANEFRIN NASAL SPRAY 0.05 % nasal spray Place into both nostrils.   blood glucose meter kit and supplies KIT Dispense based on patient and insurance preference. Use up to four times daily as directed. (FOR ICD-9 250.00, 250.01).   Blood Glucose Monitoring Suppl (ACCU-CHEK GUIDE) w/Device KIT Use to check your blood sugar daily   Blood Pressure Monitor DEVI Use to check blood pressure daily.   cetirizine (ZYRTEC) 10 MG tablet Take 10 mg by mouth daily.   ciclopirox (LOPROX) 0.77 % cream Apply topically 2 (two) times daily.   ezetimibe (ZETIA) 10 MG tablet TAKE 1 TABLET(10 MG) BY MOUTH DAILY   fenofibrate (TRICOR) 48 MG tablet TAKE 1 TABLET(48 MG) BY MOUTH DAILY   fexofenadine (ALLEGRA ALLERGY) 60 MG tablet Take 1 tablet (60 mg total) by mouth 2 (two) times daily.   fluticasone (FLONASE) 50 MCG/ACT nasal spray Place 2 sprays into both nostrils daily.   gabapentin (NEURONTIN) 100 MG capsule Take 1 capsule by mouth at bedtime.  glucose blood (ACCU-CHEK GUIDE) test strip USE AS DIRECTED FOUR TIMES DAILY.   hydrochlorothiazide (HYDRODIURIL) 25 MG tablet TAKE 1 TABLET(25 MG) BY MOUTH DAILY   hydrOXYzine (ATARAX) 10 MG tablet Take 1 tablet (10 mg total) by mouth 3 (three) times daily as needed.   ketoconazole (NIZORAL) 2 % cream Apply 1 Application topically daily.   metFORMIN (GLUCOPHAGE-XR) 500 MG 24 hr tablet Take 1 tablet (500 mg total) by mouth in the morning and at bedtime.   Multiple Vitamins-Minerals (MULTIVITAMIN WITH MINERALS) tablet Take 1 tablet by mouth daily.    nystatin cream (MYCOSTATIN) Apply 1 application. topically 2 (two) times daily.   omega-3 acid ethyl esters (LOVAZA) 1 g capsule Take 2 capsules (2 g total) by mouth daily. TAKE 1 CAPSULE BY MOUTH EVERY DAY   pregabalin (LYRICA) 25 MG capsule Take 25 mg by mouth daily.   Probiotic Product (PROBIOTIC-10 PO) Take by mouth.   rosuvastatin (CRESTOR) 10 MG tablet TAKE 1 TABLET(10 MG) BY MOUTH DAILY   Semaglutide, 2 MG/DOSE, (OZEMPIC, 2 MG/DOSE,) 8 MG/3ML SOPN Inject 2 mg into the skin once a week.   valACYclovir (VALTREX) 1000 MG tablet Take 1 tablet (1,000 mg total) by mouth 2 (two) times daily.   ascorbic acid (VITAMIN C) 1000 MG tablet Take 2,000 mg by mouth daily. (Patient not taking: Reported on 09/11/2023)   Cholecalciferol (VITAMIN D3) 50 MCG (2000 UT) TABS Take 1 tablet by mouth daily. (Patient not taking: Reported on 09/11/2023)   ciprofloxacin-dexamethasone (CIPRODEX) OTIC suspension Place 4 drops into the right ear 2 (two) times daily. (Patient not taking: Reported on 09/11/2023)   [DISCONTINUED] fexofenadine (ALLEGRA ALLERGY) 180 MG tablet Take 1 tablet (180 mg total) by mouth daily. (Patient not taking: Reported on 09/11/2023)   No facility-administered encounter medications on file as of 09/11/2023.    Review of Systems  Review of Systems  Constitutional: Negative.   HENT: Negative.    Cardiovascular: Negative.   Gastrointestinal: Negative.   Allergic/Immunologic: Negative.   Neurological: Negative.   Psychiatric/Behavioral: Negative.       Objective:   BP 122/83   Pulse 86   Temp (!) 97 F (36.1 C)   Wt 180 lb 6.4 oz (81.8 kg)   LMP 08/22/2011 Comment: patient states LMP 20 years ago  SpO2 100%   BMI 36.44 kg/m   Wt Readings from Last 5 Encounters:  09/11/23 180 lb 6.4 oz (81.8 kg)  07/24/23 181 lb (82.1 kg)  03/06/23 184 lb 9.6 oz (83.7 kg)  01/12/23 186 lb 3.2 oz (84.5 kg)  10/10/22 184 lb 3.2 oz (83.6 kg)     Physical Exam Vitals and nursing note reviewed.   Constitutional:      General: She is not in acute distress.    Appearance: She is well-developed.  Cardiovascular:     Rate and Rhythm: Normal rate and regular rhythm.  Pulmonary:     Effort: Pulmonary effort is normal.     Breath sounds: Normal breath sounds.  Neurological:     Mental Status: She is alert and oriented to person, place, and time.       Assessment & Plan:   Type 2 diabetes mellitus without complication, without long-term current use of insulin (HCC) -     POCT glycosylated hemoglobin (Hb A1C) -     CBC -     Comprehensive metabolic panel -     Microalbumin / creatinine urine ratio  Lipid screening -     Lipid panel  Flank pain -     POCT URINALYSIS DIP (CLINITEK)  Other orders -     Fexofenadine HCl; Take 1 tablet (60 mg total) by mouth 2 (two) times daily.  Dispense: 90 tablet; Refill: 2     Return in about 6 months (around 03/10/2024).     Ivonne Andrew, NP 09/11/2023

## 2023-09-12 LAB — LIPID PANEL
Chol/HDL Ratio: 4 {ratio} (ref 0.0–4.4)
Cholesterol, Total: 163 mg/dL (ref 100–199)
HDL: 41 mg/dL (ref >39)
LDL Chol Calc (NIH): 88 mg/dL (ref 0–99)
Triglycerides: 198 mg/dL — ABNORMAL HIGH (ref 0–149)
VLDL Cholesterol Cal: 34 mg/dL (ref 5–40)

## 2023-09-12 LAB — COMPREHENSIVE METABOLIC PANEL
ALT: 43 [IU]/L — ABNORMAL HIGH (ref 0–32)
AST: 29 [IU]/L (ref 0–40)
Albumin: 4.4 g/dL (ref 3.8–4.9)
Alkaline Phosphatase: 54 [IU]/L (ref 44–121)
BUN/Creatinine Ratio: 13 (ref 9–23)
BUN: 11 mg/dL (ref 6–24)
Bilirubin Total: 0.2 mg/dL (ref 0.0–1.2)
CO2: 26 mmol/L (ref 20–29)
Calcium: 10.1 mg/dL (ref 8.7–10.2)
Chloride: 103 mmol/L (ref 96–106)
Creatinine, Ser: 0.82 mg/dL (ref 0.57–1.00)
Globulin, Total: 3.2 g/dL (ref 1.5–4.5)
Glucose: 88 mg/dL (ref 70–99)
Potassium: 3.9 mmol/L (ref 3.5–5.2)
Sodium: 143 mmol/L (ref 134–144)
Total Protein: 7.6 g/dL (ref 6.0–8.5)
eGFR: 83 mL/min/{1.73_m2} (ref >59)

## 2023-09-12 LAB — CBC
Hematocrit: 42.7 % (ref 34.0–46.6)
Hemoglobin: 14.1 g/dL (ref 11.1–15.9)
MCH: 28.5 pg (ref 26.6–33.0)
MCHC: 33 g/dL (ref 31.5–35.7)
MCV: 86 fL (ref 79–97)
Platelets: 378 10*3/uL (ref 150–450)
RBC: 4.95 x10E6/uL (ref 3.77–5.28)
RDW: 13.7 % (ref 11.7–15.4)
WBC: 8.2 10*3/uL (ref 3.4–10.8)

## 2023-09-13 LAB — MICROALBUMIN / CREATININE URINE RATIO
Creatinine, Urine: 128.9 mg/dL
Microalb/Creat Ratio: 8 mg/g{creat} (ref 0–29)
Microalbumin, Urine: 10.8 ug/mL

## 2023-09-19 DIAGNOSIS — H524 Presbyopia: Secondary | ICD-10-CM | POA: Diagnosis not present

## 2023-10-03 ENCOUNTER — Other Ambulatory Visit: Payer: Self-pay

## 2023-10-03 MED ORDER — HYDROCHLOROTHIAZIDE 25 MG PO TABS: 25.0000 mg | ORAL_TABLET | Freq: Every day | ORAL | 0 refills | Status: DC

## 2023-11-13 ENCOUNTER — Encounter: Payer: Self-pay | Admitting: Podiatry

## 2023-11-13 ENCOUNTER — Ambulatory Visit: Payer: Medicaid Other | Admitting: Podiatry

## 2023-11-13 DIAGNOSIS — E119 Type 2 diabetes mellitus without complications: Secondary | ICD-10-CM | POA: Diagnosis not present

## 2023-11-13 NOTE — Progress Notes (Signed)
  Subjective:  Patient ID: Jessica Lawson, female    DOB: 1965-11-11,   MRN: [id number]  No chief complaint on file.   58 y.o. female presents for diabetic foot check. Denies burning and tingling in their feet. Denies any specific complaints. Itching has improved.   Patient is diabetic and last A1c was  Lab Results  Component Value Date   HGBA1C 6.4 (A) 09/11/2023   .   PCP:  Ivonne Andrew, NP    . Denies any other pedal complaints. Denies n/v/f/c.   Past Medical History:  Diagnosis Date   Allergy    Back pain    Diabetes mellitus without complication (HCC)    High blood cholesterol    Hypertension    Kidney stone    Normal cardiac stress test 02/2016   low risk   Obesity    Pain of cheek    and right side neck   Tobacco use    UTI (lower urinary tract infection)    Wrist pain    left wrist    Objective:  Physical Exam: Vascular: DP/PT pulses 2/4 bilateral. CFT <3 seconds. Normal hair growth on digits. No edema.  Skin. No lacerations or abrasions bilateral feet. Interspaces clean and dry. Nails 1-5 bilateral normal inappearance.   Musculoskeletal: MMT 5/5 bilateral lower extremities in DF, PF, Inversion and Eversion. Deceased ROM in DF of ankle joint.  Neurological: Sensation intact to light touch. Protective sesnation intact.   Assessment:   1. Type 2 diabetes mellitus without complication, without long-term current use of insulin (HCC)      Plan:  Patient was evaluated and treated and all questions answered. -Discussed and educated patient on diabetic foot care, especially with  regards to the vascular, neurological and musculoskeletal systems.  -Stressed the importance of good glycemic control and the detriment of not  controlling glucose levels in relation to the foot. -Discussed supportive shoes at all times and checking feet regularly.  -Answered all patient questions -Patient to return  in 1 year for DM foot check .  -Patient advised to call the  office if any problems or questions arise in the meantime.   Louann Sjogren, DPM

## 2023-12-28 ENCOUNTER — Other Ambulatory Visit: Payer: Self-pay | Admitting: Nurse Practitioner

## 2023-12-28 DIAGNOSIS — E119 Type 2 diabetes mellitus without complications: Secondary | ICD-10-CM

## 2023-12-29 ENCOUNTER — Encounter

## 2023-12-29 DIAGNOSIS — Z1231 Encounter for screening mammogram for malignant neoplasm of breast: Secondary | ICD-10-CM

## 2024-01-01 ENCOUNTER — Other Ambulatory Visit: Payer: Self-pay | Admitting: Nurse Practitioner

## 2024-01-01 DIAGNOSIS — Z1231 Encounter for screening mammogram for malignant neoplasm of breast: Secondary | ICD-10-CM

## 2024-01-03 ENCOUNTER — Encounter: Payer: Self-pay | Admitting: Internal Medicine

## 2024-01-06 ENCOUNTER — Other Ambulatory Visit: Payer: Self-pay | Admitting: Nurse Practitioner

## 2024-01-09 ENCOUNTER — Other Ambulatory Visit: Payer: Self-pay | Admitting: Nurse Practitioner

## 2024-01-09 DIAGNOSIS — E785 Hyperlipidemia, unspecified: Secondary | ICD-10-CM

## 2024-01-24 ENCOUNTER — Other Ambulatory Visit: Payer: Self-pay

## 2024-01-24 MED ORDER — EZETIMIBE 10 MG PO TABS
ORAL_TABLET | ORAL | 2 refills | Status: AC
Start: 2024-01-24 — End: ?

## 2024-01-30 ENCOUNTER — Ambulatory Visit
Admission: RE | Admit: 2024-01-30 | Discharge: 2024-01-30 | Disposition: A | Discharge status: Home / Self Care | Source: Ambulatory Visit

## 2024-01-30 DIAGNOSIS — Z1231 Encounter for screening mammogram for malignant neoplasm of breast: Secondary | ICD-10-CM | POA: Diagnosis not present

## 2024-02-02 ENCOUNTER — Other Ambulatory Visit: Payer: Self-pay | Admitting: Nurse Practitioner

## 2024-02-02 DIAGNOSIS — R928 Other abnormal and inconclusive findings on diagnostic imaging of breast: Secondary | ICD-10-CM

## 2024-02-05 ENCOUNTER — Ambulatory Visit
Admission: RE | Admit: 2024-02-05 | Discharge: 2024-02-05 | Disposition: A | Discharge status: Home / Self Care | Source: Ambulatory Visit | Attending: Nurse Practitioner | Admitting: Nurse Practitioner

## 2024-02-05 ENCOUNTER — Other Ambulatory Visit: Payer: Self-pay | Admitting: Nurse Practitioner

## 2024-02-05 DIAGNOSIS — N6001 Solitary cyst of right breast: Secondary | ICD-10-CM

## 2024-02-05 DIAGNOSIS — R928 Other abnormal and inconclusive findings on diagnostic imaging of breast: Secondary | ICD-10-CM

## 2024-02-05 DIAGNOSIS — N6311 Unspecified lump in the right breast, upper outer quadrant: Secondary | ICD-10-CM | POA: Diagnosis not present

## 2024-02-15 ENCOUNTER — Other Ambulatory Visit: Payer: Self-pay | Admitting: Nurse Practitioner

## 2024-02-15 NOTE — Telephone Encounter (Signed)
 Please advise [facility]

## 2024-02-20 ENCOUNTER — Ambulatory Visit: Payer: Medicaid Other | Admitting: Dermatology

## 2024-02-20 ENCOUNTER — Telehealth: Payer: Self-pay | Admitting: Nurse Practitioner

## 2024-02-20 ENCOUNTER — Encounter: Payer: Self-pay | Admitting: Dermatology

## 2024-02-20 VITALS — BP 138/91 | HR 68

## 2024-02-20 DIAGNOSIS — L918 Other hypertrophic disorders of the skin: Secondary | ICD-10-CM

## 2024-02-20 NOTE — Progress Notes (Signed)
   New Patient Visit   Subjective  Jessica Lawson is a 58 y.o. female who presents for the following: Here to discuss removal of skin tags.  The following portions of the chart were reviewed this encounter and updated as appropriate: medications, allergies, medical history  Review of Systems:  No other skin or systemic complaints except as noted in HPI or Assessment and Plan.  Objective  Well appearing patient in no apparent distress; mood and affect are within normal limits.   A focused examination was performed of the following areas: Neck, underarms, thighs, and under breast.  Relevant exam findings are noted in the Assessment and Plan.             Assessment & Plan   Acrochordons (Skin Tags) - Fleshy, skin-colored pedunculated papules - Benign appearing.    - Assessment: Patient presents with multiple skin tags in various locations, including neck, under breasts, under arm, and on stomach. These growths have been present for many years and have gradually increased in number. The skin tags are assessed as benign and are associated with weight gain and insulin resistance. The patient's pre-diabetic status is noted as a contributing factor to the development of these skin changes, which are considered stigmata of insulin resistance.  Treatment:  Recommended lifestyle modifications to manage pre-diabetes and prevent further skin tag formation     - Emphasized importance of blood sugar control     - Encouraged diet and exercise changes  Return for cosmetic appointment to remove up to 15 at a time. This isnt covered by insurance our office cost is $200 for up to 15.  With a $100 deposit at the time of making the appointment.      Will provide prescription for topical numbing cream to be applied 1 hour before the procedure    Advised patient can contact via MyChart for any additional questions    No follow-ups on file.  IBerwyn Lesches, Surg Tech III, am acting as  scribe for [employer], DO.   Documentation: I have reviewed the above documentation for accuracy and completeness, and I agree with the above.  Delon Lenis, DO

## 2024-02-20 NOTE — Telephone Encounter (Signed)
 Copied from CRM [id number]. Topic: Clinical - Medication Refill >> Feb 20, 2024  1:37 PM Silvana PARAS wrote: Medication: fluconazole  (DIFLUCAN ) 150 MG tablet  Has the patient contacted their pharmacy? Yes (Agent: If no, request that the patient contact the pharmacy for the refill. If patient does not wish to contact the pharmacy document the reason why and proceed with request.) (Agent: If yes, when and what did the pharmacy advise?)  This is the patient's preferred pharmacy:  [id number] - [place], [place] - [address] AT [facility] [address] [place] [address] Phone: [phone] Fax: [phone]  Phone: [phone] Fax: [phone]  Is this the correct pharmacy for this prescription? Yes If no, delete pharmacy and type the correct one.   Has the prescription been filled recently? No  Is the patient out of the medication? Yes  Has the patient been seen for an appointment in the last year OR does the patient have an upcoming appointment? Yes  Can we respond through MyChart? Yes  Agent: Please be advised that Rx refills may take up to 3 business days. We ask that you follow-up with your pharmacy.

## 2024-02-20 NOTE — Telephone Encounter (Signed)
 Medication e prescribed on 02/18/24. Called and confirmed prescription with [facility], they state they are working on filling the prescription now.

## 2024-02-20 NOTE — Patient Instructions (Addendum)
 Date: Tue Feb 20 2024  Hello Shelda,  Thank you for visiting today. Here is a summary of the key instructions:  - Skin Tag Removal:   - Removal of skin tags is not covered by insurance   - Cost starts at $200 for up to 15 skin tags   - A $100 deposit is required when booking the appointment   - The balance is due at the time of the procedure  - Procedure Details:   - Recovery period is about 2 weeks   - Keep the area clean with soap and water   - Apply Aquaphor ointment to the treated areas   - Expect flat, dark spots after healing that will fade over time  - Pre-Procedure:   - A prescription for topical numbing cream will be provided   - Apply the numbing cream 1 hour before the procedure  - Follow-up:   - Send any questions through MyChart   - Schedule an appointment when ready for skin tag removal  - Lifestyle:   - Consider diet and exercise changes to control blood sugar   - Controlling blood sugar may help prevent new skin tags  Please reach out if you have any questions or concerns.  Warm regards,  Dr. Delon Lenis, Dermatology   Important Information  Due to recent changes in healthcare laws, you may see results of your pathology and/or laboratory studies on MyChart before the doctors have had a chance to review them. We understand that in some cases there may be results that are confusing or concerning to you. Please understand that not all results are received at the same time and often the doctors may need to interpret multiple results in order to provide you with the best plan of care or course of treatment. Therefore, we ask that you please give us  2 business days to thoroughly review all your results before contacting the office for clarification. Should we see a critical lab result, you will be contacted sooner.   If You Need Anything After Your Visit  If you have any questions or concerns for your doctor, please call our main line at [phone] If no one  answers, please leave a voicemail as directed and we will return your call as soon as possible. Messages left after 4 pm will be answered the following business day.   You may also send us  a message via MyChart. We typically respond to MyChart messages within 1-2 business days.  For prescription refills, please ask your pharmacy to contact our office. Our fax number is [phone].  If you have an urgent issue when the clinic is closed that cannot wait until the next business day, you can page your doctor at the number below.    Please note that while we do our best to be available for urgent issues outside of office hours, we are not available 24/7.   If you have an urgent issue and are unable to reach us , you may choose to seek medical care at your doctor's office, retail clinic, urgent care center, or emergency room.  If you have a medical emergency, please immediately call 911 or go to the emergency department. In the event of inclement weather, please call our main line at [phone] for an update on the status of any delays or closures.  Dermatology Medication Tips: Please keep the boxes that topical medications come in in order to help keep track of the instructions about where and how to use these.  Pharmacies typically print the medication instructions only on the boxes and not directly on the medication tubes.   If your medication is too expensive, please contact our office at [phone] or send us  a message through MyChart.   We are unable to tell what your co-pay for medications will be in advance as this is different depending on your insurance coverage. However, we may be able to find a substitute medication at lower cost or fill out paperwork to get insurance to cover a needed medication.   If a prior authorization is required to get your medication covered by your insurance company, please allow us  1-2 business days to complete this process.  Drug prices often vary  depending on where the prescription is filled and some pharmacies may offer cheaper prices.  The website www.goodrx.com contains coupons for medications through different pharmacies. The prices here do not account for what the cost may be with help from insurance (it may be cheaper with your insurance), but the website can give you the price if you did not use any insurance.  - You can print the associated coupon and take it with your prescription to the pharmacy.  - You may also stop by our office during regular business hours and pick up a GoodRx coupon card.  - If you need your prescription sent electronically to a different pharmacy, notify our office through [facility] or by phone at [phone]

## 2024-03-07 ENCOUNTER — Other Ambulatory Visit: Payer: Self-pay

## 2024-03-07 MED ORDER — ROSUVASTATIN CALCIUM 10 MG PO TABS: 10.0000 mg | ORAL_TABLET | Freq: Every day | ORAL | 0 refills | Status: DC

## 2024-03-11 ENCOUNTER — Other Ambulatory Visit: Payer: Self-pay | Admitting: Nurse Practitioner

## 2024-03-11 ENCOUNTER — Ambulatory Visit: Payer: Self-pay | Admitting: Nurse Practitioner

## 2024-03-11 MED ORDER — HYDROCHLOROTHIAZIDE 25 MG PO TABS: 25.0000 mg | ORAL_TABLET | Freq: Every day | ORAL | 0 refills | Status: DC

## 2024-03-11 NOTE — Telephone Encounter (Signed)
 Copied from CRM [id number]. Topic: Clinical - Medication Refill >> Mar 11, 2024  8:18 AM Carlatta H wrote: Medication: hydrochlorothiazide  (HYDRODIURIL ) 25 MG tablet [[id number]]  Has the patient contacted their pharmacy? No (Agent: If no, request that the patient contact the pharmacy for the refill. If patient does not wish to contact the pharmacy document the reason why and proceed with request.) (Agent: If yes, when and what did the pharmacy advise?)  This is the patient's preferred pharmacy:  [id number] - [place], [place] - [address] AT [facility] [address] [place] [address] Phone: [phone] Fax: [phone]    Is this the correct pharmacy for this prescription? Yes If no, delete pharmacy and type the correct one.   Has the prescription been filled recently? No  Is the patient out of the medication? Yes  Has the patient been seen for an appointment in the last year OR does the patient have an upcoming appointment? Yes  Can we respond through MyChart? No  Agent: Please be advised that Rx refills may take up to 3 business days. We ask that you follow-up with your pharmacy.

## 2024-04-19 ENCOUNTER — Ambulatory Visit: Payer: Self-pay | Admitting: Nurse Practitioner

## 2024-04-22 ENCOUNTER — Other Ambulatory Visit: Payer: Self-pay | Admitting: Nurse Practitioner

## 2024-04-22 DIAGNOSIS — E119 Type 2 diabetes mellitus without complications: Secondary | ICD-10-CM

## 2024-04-23 ENCOUNTER — Other Ambulatory Visit: Payer: Self-pay | Admitting: Nurse Practitioner

## 2024-04-23 DIAGNOSIS — E785 Hyperlipidemia, unspecified: Secondary | ICD-10-CM

## 2024-04-23 DIAGNOSIS — E119 Type 2 diabetes mellitus without complications: Secondary | ICD-10-CM

## 2024-05-06 ENCOUNTER — Encounter: Payer: Self-pay | Admitting: Nurse Practitioner

## 2024-05-06 ENCOUNTER — Ambulatory Visit: Admitting: Nurse Practitioner

## 2024-05-06 VITALS — BP 124/75 | HR 74 | Ht 59.0 in | Wt 184.0 lb

## 2024-05-06 DIAGNOSIS — Z13220 Encounter for screening for lipoid disorders: Secondary | ICD-10-CM

## 2024-05-06 DIAGNOSIS — E119 Type 2 diabetes mellitus without complications: Secondary | ICD-10-CM | POA: Diagnosis not present

## 2024-05-06 DIAGNOSIS — Z1329 Encounter for screening for other suspected endocrine disorder: Secondary | ICD-10-CM

## 2024-05-06 LAB — POCT GLYCOSYLATED HEMOGLOBIN (HGB A1C): Hemoglobin A1C: 6.9 % — AB (ref 4.0–5.6)

## 2024-05-06 NOTE — Progress Notes (Signed)
 Subjective   Patient ID: Jessica Lawson, female    DOB: 28-Jun-1966, 58 y.o.   MRN: [id number]  Chief Complaint  Patient presents with   Diabetes    Referring provider: Oley Bascom RAMAN, NP  Jessica Lawson is a 58 y.o. female with Past Medical History: No date: Allergy No date: Back pain No date: Diabetes mellitus without complication (HCC) No date: High blood cholesterol No date: Hypertension No date: Kidney stone 02/2016: Normal cardiac stress test     Comment:  low risk No date: Obesity No date: Pain of cheek     Comment:  and right side neck No date: Tobacco use No date: UTI (lower urinary tract infection) No date: Wrist pain     Comment:  left wrist   HPI   Patient presents today for follow-up visit.  Overall she has been doing well since her last visit here. Has seen podiatry and will be following up in march. A1C in office today is 6.9. Patient does complain of some flank pain over he past few days. Will check UA.  Denies f/c/s, n/v/d, hemoptysis, PND, leg swelling. Denies chest pain or edema.     Allergies  Allergen Reactions   Asa [Aspirin] Anaphylaxis, Hives and Swelling   Iohexol Other (See Comments)     Desc: pt complains of difficulty swallowing and thickened tongue/ throat closes up    Penicillins Hives, Swelling and Other (See Comments)    Thrush    Sulfa Antibiotics Anaphylaxis, Hives, Swelling and Other (See Comments)   Nystatin  Other (See Comments)    thrush    Immunization History  Administered Date(s) Administered   Influenza, Seasonal, Injecte, Preservative Fre 06/08/2023   Influenza,inj,Quad PF,6+ Mos 05/22/2020, 05/20/2022   MMRV 06/18/2020   PFIZER(Purple Top)SARS-COV-2 Vaccination 02/07/2020, 02/28/2020   Pneumococcal Polysaccharide-23 09/03/2015   Tdap 11/01/2018    Tobacco History: Social History   Tobacco Use  Smoking Status Former   Current packs/day: 0.00   Types: Cigarettes   Quit date: 07/26/2016   Years since  quitting: 7.7  Smokeless Tobacco Never   Counseling given: Not Answered   Outpatient Encounter Medications as of 05/06/2024  Medication Sig   Accu-Chek Softclix Lancets lancets USE AS DIRECTED FOUR TIMES DAILY.   albuterol  (PROAIR  HFA) 108 (90 Base) MCG/ACT inhaler Inhale 2 puffs into the lungs every 4 (four) hours as needed for wheezing or shortness of breath.   ANEFRIN NASAL SPRAY 0.05 % nasal spray Place into both nostrils.   blood glucose meter kit and supplies KIT Dispense based on patient and insurance preference. Use up to four times daily as directed. (FOR ICD-9 250.00, 250.01).   Blood Glucose Monitoring Suppl (ACCU-CHEK GUIDE) w/Device KIT Use to check your blood sugar daily   Blood Pressure Monitor DEVI Use to check blood pressure daily.   cetirizine  (ZYRTEC ) 10 MG tablet Take 10 mg by mouth daily.   ciclopirox  (LOPROX ) 0.77 % cream Apply topically 2 (two) times daily.   ciprofloxacin -dexamethasone  (CIPRODEX ) OTIC suspension Place 4 drops into the right ear 2 (two) times daily.   ezetimibe  (ZETIA ) 10 MG tablet TAKE 1 TABLET(10 MG) BY MOUTH DAILY   fenofibrate  (TRICOR ) 48 MG tablet TAKE 1 TABLET(48 MG) BY MOUTH DAILY   fexofenadine  (ALLEGRA  ALLERGY) 60 MG tablet Take 1 tablet (60 mg total) by mouth 2 (two) times daily.   fluconazole  (DIFLUCAN ) 150 MG tablet TAKE 1 TABLET(150 MG) BY MOUTH DAILY   fluticasone  (FLONASE ) 50 MCG/ACT nasal spray Place 2  sprays into both nostrils daily.   gabapentin  (NEURONTIN ) 100 MG capsule Take 1 capsule by mouth at bedtime.   glucose blood (ACCU-CHEK GUIDE) test strip USE AS DIRECTED FOUR TIMES DAILY.   hydrochlorothiazide  (HYDRODIURIL ) 25 MG tablet Take 1 tablet (25 mg total) by mouth daily.   hydrOXYzine  (ATARAX ) 10 MG tablet Take 1 tablet (10 mg total) by mouth 3 (three) times daily as needed.   ketoconazole  (NIZORAL ) 2 % cream Apply 1 Application topically daily.   metFORMIN  (GLUCOPHAGE -XR) 500 MG 24 hr tablet TAKE 1 TABLET(500 MG) BY MOUTH IN  THE MORNING AND AT BEDTIME   Multiple Vitamins-Minerals (MULTIVITAMIN WITH MINERALS) tablet Take 1 tablet by mouth daily.   nystatin  cream (MYCOSTATIN ) Apply 1 application. topically 2 (two) times daily.   omega-3 acid ethyl esters (LOVAZA ) 1 g capsule Take 2 capsules (2 g total) by mouth daily. TAKE 1 CAPSULE BY MOUTH EVERY DAY   OZEMPIC , 2 MG/DOSE, 8 MG/3ML SOPN INJECT 2 MG UNDER THE SKIN ONE DAY A WEEK ONCE A WEEK   pregabalin (LYRICA) 25 MG capsule Take 25 mg by mouth daily.   Probiotic Product (PROBIOTIC-10 PO) Take by mouth.   rosuvastatin  (CRESTOR ) 10 MG tablet Take 1 tablet (10 mg total) by mouth daily.   valACYclovir  (VALTREX ) 1000 MG tablet Take 1 tablet (1,000 mg total) by mouth 2 (two) times daily.   ascorbic acid (VITAMIN C) 1000 MG tablet Take 2,000 mg by mouth daily. (Patient not taking: Reported on 09/11/2023)   Cholecalciferol (VITAMIN D3) 50 MCG (2000 UT) TABS Take 1 tablet by mouth daily. (Patient not taking: Reported on 09/11/2023)   No facility-administered encounter medications on file as of 05/06/2024.    Review of Systems  Review of Systems  Constitutional: Negative.   HENT: Negative.    Cardiovascular: Negative.   Gastrointestinal: Negative.   Allergic/Immunologic: Negative.   Neurological: Negative.   Psychiatric/Behavioral: Negative.       Objective:   BP 124/75   Pulse 74   Ht 4' 11 (1.499 m)   Wt 184 lb (83.5 kg)   LMP 08/22/2011 Comment: patient states LMP 20 years ago  SpO2 99%   BMI 37.16 kg/m   Wt Readings from Last 5 Encounters:  05/06/24 184 lb (83.5 kg)  09/11/23 180 lb 6.4 oz (81.8 kg)  07/24/23 181 lb (82.1 kg)  03/06/23 184 lb 9.6 oz (83.7 kg)  01/12/23 186 lb 3.2 oz (84.5 kg)     Physical Exam Vitals and nursing note reviewed.  Constitutional:      General: She is not in acute distress.    Appearance: She is well-developed.  Cardiovascular:     Rate and Rhythm: Normal rate and regular rhythm.  Pulmonary:     Effort:  Pulmonary effort is normal.     Breath sounds: Normal breath sounds.  Neurological:     Mental Status: She is alert and oriented to person, place, and time.       Assessment & Plan:   Lipid screening -     Lipid panel  Type 2 diabetes mellitus without complication, without long-term current use of insulin (HCC) -     CBC -     Comprehensive metabolic panel with GFR  Thyroid  disorder screen -     TSH     Return in about 6 months (around 11/03/2024).   Bascom GORMAN Borer, NP 05/06/2024

## 2024-05-07 LAB — CBC
Hematocrit: 42.1 % (ref 34.0–46.6)
Hemoglobin: 13.9 g/dL (ref 11.1–15.9)
MCH: 28.4 pg (ref 26.6–33.0)
MCHC: 33 g/dL (ref 31.5–35.7)
MCV: 86 fL (ref 79–97)
Platelets: 325 x10E3/uL (ref 150–450)
RBC: 4.89 x10E6/uL (ref 3.77–5.28)
RDW: 14.1 % (ref 11.7–15.4)
WBC: 6.6 x10E3/uL (ref 3.4–10.8)

## 2024-05-07 LAB — COMPREHENSIVE METABOLIC PANEL WITH GFR
ALT: 62 IU/L — ABNORMAL HIGH (ref 0–32)
AST: 38 IU/L (ref 0–40)
Albumin: 4.5 g/dL (ref 3.8–4.9)
Alkaline Phosphatase: 47 IU/L — ABNORMAL LOW (ref 49–135)
BUN/Creatinine Ratio: 17 (ref 9–23)
BUN: 13 mg/dL (ref 6–24)
Bilirubin Total: 0.4 mg/dL (ref 0.0–1.2)
CO2: 25 mmol/L (ref 20–29)
Calcium: 9.5 mg/dL (ref 8.7–10.2)
Chloride: 102 mmol/L (ref 96–106)
Creatinine, Ser: 0.78 mg/dL (ref 0.57–1.00)
Globulin, Total: 2.6 g/dL (ref 1.5–4.5)
Glucose: 90 mg/dL (ref 70–99)
Potassium: 3.7 mmol/L (ref 3.5–5.2)
Sodium: 142 mmol/L (ref 134–144)
Total Protein: 7.1 g/dL (ref 6.0–8.5)
eGFR: 88 mL/min/1.73

## 2024-05-07 LAB — LIPID PANEL
Chol/HDL Ratio: 3.7 ratio (ref 0.0–4.4)
Cholesterol, Total: 136 mg/dL (ref 100–199)
HDL: 37 mg/dL — ABNORMAL LOW
LDL Chol Calc (NIH): 74 mg/dL (ref 0–99)
Triglycerides: 145 mg/dL (ref 0–149)
VLDL Cholesterol Cal: 25 mg/dL (ref 5–40)

## 2024-05-07 LAB — TSH: TSH: 0.789 u[IU]/mL (ref 0.450–4.500)

## 2024-05-08 ENCOUNTER — Ambulatory Visit: Payer: Self-pay | Admitting: Nurse Practitioner

## 2024-05-17 ENCOUNTER — Ambulatory Visit: Payer: PRIVATE HEALTH INSURANCE | Attending: Internal Medicine | Admitting: Internal Medicine

## 2024-05-17 ENCOUNTER — Ambulatory Visit: Admitting: Internal Medicine

## 2024-05-17 ENCOUNTER — Encounter: Payer: Self-pay | Admitting: Internal Medicine

## 2024-05-17 VITALS — BP 132/64 | HR 73 | Ht 59.0 in | Wt 184.6 lb

## 2024-05-17 DIAGNOSIS — E119 Type 2 diabetes mellitus without complications: Secondary | ICD-10-CM

## 2024-05-17 DIAGNOSIS — R0602 Shortness of breath: Secondary | ICD-10-CM

## 2024-05-17 DIAGNOSIS — I10 Essential (primary) hypertension: Secondary | ICD-10-CM

## 2024-05-17 NOTE — Progress Notes (Signed)
 Cardiology Office Note:  .   Date:  05/17/2024  ID:  Jessica Lawson, DOB 01/08/1966, MRN [id number] PCP: Oley Bascom RAMAN, NP  [facility] Providers Cardiologist:  Soyla DELENA Merck, MD    History of Present Illness: .   Jessica Lawson is a 58 y.o. female.  Discussed the use of AI scribe software for clinical note transcription with the patient, who gave verbal consent to proceed.  History of Present Illness Jessica Lawson is a 58 year old female with diabetes and hyperlipidemia who presents with shortness of breath.  She experienced a sudden onset of shortness of breath yesterday, which resolved spontaneously without new chest pain. Blood pressure was 118/76, and oxygen levels were normal. She feels tired, attributing it to her busy workplace. A COVID test was negative.  Diabetes management includes dietary adjustments after discontinuing semaglutide  due to nausea. Her last A1c was 6.9. She sometimes eats at night to stabilize blood sugar.  Medications include ezetimibe  10 mg daily, hydrochlorothiazide  25 mg daily, metformin , rosuvastatin  10 mg daily, and fenofibrate  48 mg daily. Recent labs show LDL at 74 and triglycerides at 145.  She experiences ankle swelling and is on her feet a lot at work. No significant episodes of shortness of breath have occurred since yesterday, and no fluid or swelling beyond what she attributes to standing at work.    ROS: negative except per HPI above.  Studies Reviewed: SABRA   EKG Interpretation Date/Time:  Friday May 17 2024 10:44:30 EDT Ventricular Rate:  73 PR Interval:  146 QRS Duration:  86 QT Interval:  314 QTC Calculation: 345 R Axis:   17  Text Interpretation: Normal sinus rhythm Septal infarct , age undetermined When compared with ECG of 24-Jul-2023 13:45, Septal infarct is now Present Nonspecific T wave abnormality, worse in Anterior leads QT has shortened Confirmed by Merck Soyla ([id number]) on 05/17/2024 11:08:52 AM     Results LABS HbA1c: 6.9 (05/03/2024) LDL: 74 mg/dL Triglycerides: 854 mg/dL  DIAGNOSTIC ECG: Minor changes, similar to previous Risk Assessment/Calculations:       Physical Exam:   VS:  BP 132/64   Pulse 73   Ht 4' 11 (1.499 m)   Wt 184 lb 9.6 oz (83.7 kg)   LMP 08/22/2011 Comment: patient states LMP 20 years ago  SpO2 99%   BMI 37.28 kg/m    Wt Readings from Last 3 Encounters:  05/17/24 184 lb 9.6 oz (83.7 kg)  05/06/24 184 lb (83.5 kg)  09/11/23 180 lb 6.4 oz (81.8 kg)     Physical Exam GENERAL: Alert, cooperative, well developed, no acute distress. HEENT: Normocephalic, normal oropharynx, moist mucous membranes. CHEST: Clear to auscultation bilaterally, no wheezes, rhonchi, or crackles. CARDIOVASCULAR: Normal heart rate and rhythm, S1 and S2 normal without murmurs. Strong peripheral pulses. ABDOMEN: Soft, non-tender, non-distended, without organomegaly, normal bowel sounds. EXTREMITIES: No cyanosis or edema. NEUROLOGICAL: Cranial nerves grossly intact, moves all extremities without gross motor or sensory deficit.   ASSESSMENT AND PLAN: .    Assessment and Plan Assessment & Plan Type 2 diabetes mellitus Type 2 diabetes mellitus with A1c of 6.9, indicating good control. Managed nausea from semaglutide  with dietary adjustments. Blood glucose levels lower, requiring hypoglycemia symptom management. -management per PCP.  Essential hypertension Recent blood pressure 118/76. - Continue hydrochlorothiazide  25 mg daily.  Hyperlipidemia Hyperlipidemia well-managed. LDL at 74, triglycerides at 145, both within target range. Improvement noted since last visit. - Continue rosuvastatin  10 mg daily. - Continue ezetimibe  10  mg daily. - Continue fenofibrate  48 mg daily.  Dyspnea Bilateral lower extremity edema Bilateral lower extremity edema likely due to prolonged standing. - Recommend compression socks or leggings. - monitor dyspnea, will consider further  evaluation if worsens.        Soyla Merck, MD, FACC

## 2024-05-17 NOTE — Patient Instructions (Signed)
 Medication Instructions:  Your physician recommends that you continue on your current medications as directed. Please refer to the Current Medication list given to you today.  *If you need a refill on your cardiac medications before your next appointment, please call your pharmacy*  Lab Work: none If you have labs (blood work) drawn today and your tests are completely normal, you will receive your results only by: MyChart Message (if you have MyChart) OR A paper copy in the mail If you have any lab test that is abnormal or we need to change your treatment, we will call you to review the results.  Testing/Procedures: none  Follow-Up: At [facility], you and your health needs are our priority.  As part of our continuing mission to provide you with exceptional heart care, our providers are all part of one team.  This team includes your primary Cardiologist (physician) and Advanced Practice Providers or APPs (Physician Assistants and Nurse Practitioners) who all work together to provide you with the care you need, when you need it.  Your next appointment:   1 year  Provider:   Dr. Loni  We recommend signing up for the patient portal called MyChart.  Sign up information is provided on this After Visit Summary.  MyChart is used to connect with patients for Virtual Visits (Telemedicine).  Patients are able to view lab/test results, encounter notes, upcoming appointments, etc.  Non-urgent messages can be sent to your provider as well.   To learn more about what you can do with MyChart, go to [email or web address].   Other Instructions none

## 2024-06-04 ENCOUNTER — Other Ambulatory Visit: Payer: Self-pay | Admitting: Nurse Practitioner

## 2024-06-11 ENCOUNTER — Telehealth: Payer: Self-pay | Admitting: Pharmacy Technician

## 2024-06-11 ENCOUNTER — Other Ambulatory Visit (HOSPITAL_COMMUNITY): Payer: Self-pay

## 2024-06-11 NOTE — Telephone Encounter (Signed)
 Pharmacy Patient Advocate Encounter  Received notification from [facility] that Prior Authorization for Fexofenadine  HCl 60MG  tablets has been CANCELLED due to     PA #/Case ID/Reference #: [id number]  Medication is available OTC.

## 2024-06-11 NOTE — Telephone Encounter (Signed)
 Pharmacy Patient Advocate Encounter  Received notification from [facility] that Prior Authorization for Ozempic  (2 MG/DOSE) 8MG /3ML pen-injectors has been APPROVED from 06/11/2024 to 06/11/2025.   PA #/Case ID/Reference #: [id number]

## 2024-06-11 NOTE — Telephone Encounter (Signed)
 Pharmacy Patient Advocate Encounter   Received notification from Pt Calls Messages that prior authorization for Ozempic  (2 MG/DOSE) 8MG /3ML pen-injectors is required/requested.   Insurance verification completed.   The patient is insured through [employer].   Per test claim: PA required; PA submitted to above mentioned insurance via Latent Key/confirmation #/EOC [id number] Status is pending

## 2024-06-11 NOTE — Telephone Encounter (Signed)
 Pharmacy Patient Advocate Encounter   Received notification from Pt Calls Messages that prior authorization for Fexofenadine  HCl 60MG  tablets is required/requested.   Insurance verification completed.   The patient is insured through [employer].   Per test claim: PA required; PA submitted to above mentioned insurance via Latent Key/confirmation #/EOC [id number] Status is pending

## 2024-06-15 ENCOUNTER — Other Ambulatory Visit: Payer: Self-pay | Admitting: Nurse Practitioner

## 2024-06-15 DIAGNOSIS — E119 Type 2 diabetes mellitus without complications: Secondary | ICD-10-CM

## 2024-06-21 ENCOUNTER — Ambulatory Visit: Payer: Self-pay | Admitting: *Deleted

## 2024-06-21 NOTE — Telephone Encounter (Signed)
 FYI Only or Action Required?: FYI only for provider: ED advised.  Patient was last seen in primary care on 05/06/2024 by Oley Bascom RAMAN, NP.  Called Nurse Triage reporting Chest Pain.  Symptoms began today.  Interventions attempted: Nothing.  Symptoms are: unchanged.  Triage Disposition: Go to ED Now (Notify PCP)  Patient/caregiver understands and will follow disposition?: No, refuses disposition  Copied from CRM [id number]. Topic: Clinical - Red Word Triage >> Jun 21, 2024 12:07 PM Victoria B wrote: Patient/ has tightness in chest and scratch throat Reason for Disposition  [1] Chest pain (or angina) comes and goes AND [2] is happening more often (increasing in frequency) or getting worse (increasing in severity)  (Exception: Chest pains that last only a few seconds.)  Answer Assessment - Initial Assessment Questions 1. LOCATION: Where does it hurt?       Across the chest 2. RADIATION: Does the pain go anywhere else? (e.g., into neck, jaw, arms, back)     no 3. ONSET: When did the chest pain begin? (Minutes, hours or days)      Wednesday 4. PATTERN: Does the pain come and go, or has it been constant since it started?  Does it get worse with exertion?      Comes and goes-  5. DURATION: How long does it last (e.g., seconds, minutes, hours)     Cough and scratchy thought- lasts 10-15 minutes 6. SEVERITY: How bad is the pain?  (e.g., Scale 1-10; mild, moderate, or severe)     1-2/10 7. CARDIAC RISK FACTORS: Do you have any history of heart problems or risk factors for heart disease? (e.g., angina, prior heart attack; diabetes, high blood pressure, high cholesterol, smoker, or strong family history of heart disease)     hypertension 8. PULMONARY RISK FACTORS: Do you have any history of lung disease?  (e.g., blood clots in lung, asthma, emphysema, birth control pills)     no 9. CAUSE: What do you think is causing the chest pain?     Allergy seasonal 10. OTHER  SYMPTOMS: Do you have any other symptoms? (e.g., dizziness, nausea, vomiting, sweating, fever, difficulty breathing, cough)       Scratchy throat, weakness- patient feels related to Allegra , sore throat, cough- mild  Protocols used: Chest Pain-A-AH

## 2024-07-06 ENCOUNTER — Other Ambulatory Visit: Payer: Self-pay | Admitting: Nurse Practitioner

## 2024-07-06 DIAGNOSIS — E119 Type 2 diabetes mellitus without complications: Secondary | ICD-10-CM

## 2024-07-10 ENCOUNTER — Telehealth: Payer: Self-pay | Admitting: Nurse Practitioner

## 2024-07-10 NOTE — Telephone Encounter (Signed)
 Copied from CRM [id number]. Topic: Clinical - Prescription Issue >> Jul 10, 2024 11:03 AM Jessica Lawson wrote: Reason for CRM: Patient called.SABRA adv to send Ozempic  to new location so the ins will cover and she would like script fill for zofran  also.. she said the mail service she wants it sent to is [facility] [place] [facility]

## 2024-07-18 ENCOUNTER — Other Ambulatory Visit: Payer: Self-pay

## 2024-07-18 DIAGNOSIS — E119 Type 2 diabetes mellitus without complications: Secondary | ICD-10-CM

## 2024-07-18 NOTE — Telephone Encounter (Signed)
 Pt was called and lvm to provide more info concerning the [facility].

## 2024-07-23 NOTE — Telephone Encounter (Signed)
 Done [facility]

## 2024-07-24 ENCOUNTER — Other Ambulatory Visit: Payer: Self-pay

## 2024-08-12 ENCOUNTER — Ambulatory Visit
Admission: RE | Admit: 2024-08-12 | Discharge: 2024-08-12 | Disposition: A | Payer: PRIVATE HEALTH INSURANCE | Source: Ambulatory Visit | Attending: Nurse Practitioner

## 2024-08-12 DIAGNOSIS — N6001 Solitary cyst of right breast: Secondary | ICD-10-CM

## 2024-08-13 ENCOUNTER — Ambulatory Visit: Payer: Self-pay | Admitting: Nurse Practitioner

## 2024-08-14 ENCOUNTER — Other Ambulatory Visit: Payer: Self-pay | Admitting: Nurse Practitioner

## 2024-08-14 DIAGNOSIS — R928 Other abnormal and inconclusive findings on diagnostic imaging of breast: Secondary | ICD-10-CM

## 2024-08-31 ENCOUNTER — Other Ambulatory Visit: Payer: Self-pay | Admitting: Nurse Practitioner

## 2024-08-31 DIAGNOSIS — B009 Herpesviral infection, unspecified: Secondary | ICD-10-CM

## 2024-09-02 NOTE — Telephone Encounter (Signed)
 Please Advise.  CB.

## 2024-09-02 NOTE — Telephone Encounter (Signed)
 valACYclovir  (VALTREX ) 1000 MG tablet [Pharmacy Med Name: valACYclovir  1 gram tablet (VALTREX )]

## 2024-09-10 ENCOUNTER — Telehealth: Payer: Self-pay | Admitting: Nurse Practitioner

## 2024-09-10 ENCOUNTER — Other Ambulatory Visit: Payer: Self-pay

## 2024-09-10 MED ORDER — ROSUVASTATIN CALCIUM 10 MG PO TABS: 10.0000 mg | ORAL_TABLET | Freq: Every day | ORAL | 0 refills | Status: AC

## 2024-09-10 NOTE — Telephone Encounter (Signed)
 Prescription sent as requested. CB.

## 2024-09-10 NOTE — Telephone Encounter (Signed)
 rosuvastatin  (CRESTOR ) 10 MG tablet [[id number]]

## 2024-11-04 ENCOUNTER — Ambulatory Visit: Payer: Self-pay | Admitting: Nurse Practitioner

## 2024-11-18 ENCOUNTER — Ambulatory Visit: Admitting: Podiatry

## 2025-02-06 ENCOUNTER — Other Ambulatory Visit: Payer: PRIVATE HEALTH INSURANCE
